# Patient Record
Sex: Female | Born: 1980 | Race: Black or African American | Hispanic: No | Marital: Married | Smoking: Former smoker
Health system: Southern US, Community
[De-identification: ages and names within clinical notes are randomized; demographics above are authoritative.]

## PROBLEM LIST (undated history)

## (undated) DIAGNOSIS — M5430 Sciatica, unspecified side: Secondary | ICD-10-CM

## (undated) DIAGNOSIS — H5789 Other specified disorders of eye and adnexa: Secondary | ICD-10-CM

## (undated) DIAGNOSIS — O039 Complete or unspecified spontaneous abortion without complication: Secondary | ICD-10-CM

## (undated) DIAGNOSIS — M549 Dorsalgia, unspecified: Secondary | ICD-10-CM

## (undated) HISTORY — DX: Other specified disorders of eye and adnexa: H57.89

## (undated) HISTORY — DX: Sciatica, unspecified side: M54.30

## (undated) HISTORY — DX: Dorsalgia, unspecified: M54.9

## (undated) HISTORY — DX: Complete or unspecified spontaneous abortion without complication: O03.9

## (undated) HISTORY — PX: HEMORRHOID SURGERY: SHX153

---

## 2013-05-05 ENCOUNTER — Emergency Department: Payer: Self-pay | Admitting: Emergency Medicine

## 2013-06-26 ENCOUNTER — Emergency Department: Payer: Self-pay | Admitting: Internal Medicine

## 2013-06-26 LAB — CBC
HCT: 32.2 % — ABNORMAL LOW (ref 35.0–47.0)
HGB: 10.7 g/dL — ABNORMAL LOW (ref 12.0–16.0)
MCH: 27.4 pg (ref 26.0–34.0)
MCHC: 33.3 g/dL (ref 32.0–36.0)
Platelet: 334 10*3/uL (ref 150–440)
RBC: 3.91 10*6/uL (ref 3.80–5.20)
RDW: 16.4 % — ABNORMAL HIGH (ref 11.5–14.5)

## 2013-06-26 LAB — COMPREHENSIVE METABOLIC PANEL
Alkaline Phosphatase: 72 U/L (ref 50–136)
Anion Gap: 6 — ABNORMAL LOW (ref 7–16)
Calcium, Total: 8.8 mg/dL (ref 8.5–10.1)
Chloride: 105 mmol/L (ref 98–107)
Co2: 28 mmol/L (ref 21–32)
Creatinine: 0.57 mg/dL — ABNORMAL LOW (ref 0.60–1.30)
EGFR (African American): >60
EGFR (Non-African Amer.): >60
Glucose: 84 mg/dL (ref 65–99)
Potassium: 3.8 mmol/L (ref 3.5–5.1)
SGPT (ALT): 20 U/L (ref 12–78)

## 2013-06-26 LAB — RAPID INFLUENZA A&B ANTIGENS

## 2013-09-15 ENCOUNTER — Emergency Department: Payer: Self-pay | Admitting: Emergency Medicine

## 2013-11-18 ENCOUNTER — Emergency Department: Payer: Self-pay | Admitting: Emergency Medicine

## 2014-02-06 ENCOUNTER — Ambulatory Visit: Payer: Self-pay | Admitting: Family Medicine

## 2014-03-08 ENCOUNTER — Emergency Department: Payer: Self-pay | Admitting: Emergency Medicine

## 2015-03-16 ENCOUNTER — Emergency Department
Admission: EM | Admit: 2015-03-16 | Discharge: 2015-03-17 | Disposition: A | Discharge status: Home / Self Care | Payer: Self-pay | Attending: Emergency Medicine | Admitting: Emergency Medicine

## 2015-03-16 ENCOUNTER — Encounter: Payer: Self-pay | Admitting: Emergency Medicine

## 2015-03-16 DIAGNOSIS — Z720 Tobacco use: Secondary | ICD-10-CM | POA: Insufficient documentation

## 2015-03-16 DIAGNOSIS — K047 Periapical abscess without sinus: Secondary | ICD-10-CM

## 2015-03-16 DIAGNOSIS — I10 Essential (primary) hypertension: Secondary | ICD-10-CM | POA: Insufficient documentation

## 2015-03-16 DIAGNOSIS — L538 Other specified erythematous conditions: Secondary | ICD-10-CM | POA: Insufficient documentation

## 2015-03-16 DIAGNOSIS — K0381 Cracked tooth: Secondary | ICD-10-CM | POA: Insufficient documentation

## 2015-03-16 DIAGNOSIS — K0889 Other specified disorders of teeth and supporting structures: Secondary | ICD-10-CM

## 2015-03-16 MED ORDER — OXYCODONE-ACETAMINOPHEN 5-325 MG PO TABS
1.0000 | ORAL_TABLET | Freq: Once | ORAL | Status: AC
  Administered 2015-03-16: 1 via ORAL
  Filled 2015-03-16: qty 1

## 2015-03-16 MED ORDER — IBUPROFEN 800 MG PO TABS: 800.0000 mg | ORAL_TABLET | Freq: Three times a day (TID) | ORAL | Status: DC | PRN

## 2015-03-16 MED ORDER — PENICILLIN V POTASSIUM 500 MG PO TABS
500.0000 mg | ORAL_TABLET | Freq: Once | ORAL | Status: AC
  Administered 2015-03-16: 500 mg via ORAL
  Filled 2015-03-16: qty 1

## 2015-03-16 MED ORDER — LIDOCAINE VISCOUS 2 % MT SOLN
15.0000 mL | Freq: Once | OROMUCOSAL | Status: AC
  Administered 2015-03-16: 15 mL via OROMUCOSAL
  Filled 2015-03-16: qty 15

## 2015-03-16 MED ORDER — PENICILLIN V POTASSIUM 500 MG PO TABS: 500.0000 mg | ORAL_TABLET | Freq: Four times a day (QID) | ORAL | Status: DC

## 2015-03-16 MED ORDER — OXYCODONE-ACETAMINOPHEN 5-325 MG PO TABS: 1.0000 | ORAL_TABLET | Freq: Three times a day (TID) | ORAL | Status: DC | PRN

## 2015-03-16 NOTE — ED Provider Notes (Signed)
[facility] Emergency Department Provider Note  ____________________________________________  Time seen: Approximately 11:08 PM  I have reviewed the triage vital signs and the nursing notes.   HISTORY  Chief Complaint Dental Pain   HPI Ashley Herring is a 34 y.o. female presents to the ER for the complaints of right lower dental pain and swelling. Patient reports that yesterday she was eating peanuts and ate peanut came stuck and a broken tooth and she states that she had a work to get that pain at out of her tooth which causes increased pain. Patient states that she woke up this morning with increased pain as well as swelling to her right lower face. Patient states that pain intermittently shoots to her right ear. Denies fall or injury. Patient presents she has had a broken tooth in that area for years but does not typically give her problems.   Reports pain is currently 6-7 out of 10 and throbbing pain. Denies other pain or injury. Denies fever.   History reviewed. No pertinent past medical history. HTN  There are no active problems to display for this patient.   Past Surgical History  Procedure Laterality Date  . Hemorrhoid surgery      Current Outpatient Rx  Name  Route  Sig  Dispense  Refill  .           .           .             Allergies Peanuts  History reviewed. No pertinent family history.  Social History History  Substance Use Topics  . Smoking status: Current Some Day Smoker  . Smokeless tobacco: Not on file  . Alcohol Use: No    Review of Systems Constitutional: No fever/chills Eyes: No visual changes. ENT: No sore throat. Positive dental pain. Denies lip, tongue swelling. Denies difficulty swallowing. Reports continues to eat and drink well.  Cardiovascular: Denies chest pain. Respiratory: Denies shortness of breath. Gastrointestinal: No abdominal pain.  No nausea, no vomiting.  No diarrhea.  No  constipation. Genitourinary: Negative for dysuria. Musculoskeletal: Negative for back pain. Skin: Negative for rash. Neurological: Negative for headaches, focal weakness or numbness.  10-point ROS otherwise negative.  ____________________________________________   PHYSICAL EXAM:  VITAL SIGNS: ED Triage Vitals  Enc Vitals Group     BP 03/16/15 2145 164/97 mmHg     Pulse Rate 03/16/15 2145 89     Resp 03/16/15 2145 20     Temp 03/16/15 2145 98.9 F (37.2 C)     Temp Source 03/16/15 2145 Oral     SpO2 03/16/15 2145 98 %     Weight 03/16/15 2145 315 lb (142.883 kg)     Height 03/16/15 2145 5\' 6"  (1.676 m)     Head Cir --      Peak Flow --      Pain Score 03/16/15 2145 10     Pain Loc --      Pain Edu? --      Excl. in GC? --     Constitutional: Alert and oriented. Well appearing and in no acute distress. Eyes: Conjunctivae are normal. PERRL. EOMI. Head: [email or web address].mild right lower face swelling.  Nose: No congestion/rhinnorhea. Ears: no erythema, normal TMs.  Mouth/Throat: Mucous membranes are moist.  Oropharynx non-erythematous. Right lower #28 fractured tooth, with mild erythema, and gum swelling. Small mild palpable fluctuance with appearance of dental abscess.  Neck: No stridor.  No cervical spine tenderness to  palpation. Hematological/Lymphatic/Immunilogical: No cervical lymphadenopathy. Cardiovascular: Normal rate, regular rhythm. Grossly normal heart sounds.  Good peripheral circulation. Respiratory: Normal respiratory effort.  No retractions. Lungs CTAB. Gastrointestinal: Soft and nontender.obese abdomen.  Musculoskeletal: No lower extremity tenderness nor edema.  No joint effusions. Neurologic:  Normal speech and language. No gross focal neurologic deficits are appreciated. Speech is normal. No gait instability. Skin:  Skin is warm, dry and intact. No rash noted. Psychiatric: Mood and affect are normal. Speech and behavior are  normal.   PROCEDURES  Procedure(s) performed: INCISION AND DRAINAGE Performed by: Renford Dills Consent: Verbal consent obtained. Risks and benefits: risks, benefits and alternatives were discussed Type: abscess  Body area: right lower   Anesthesia: local infiltration  Incision was made with a #11scalpel.  Anesthetic: topical lidocaine viscous  Drainage: purulent  Drainage amount:mild to mod  Patient tolerance: Patient tolerated the procedure well with no immediate complications.  Pt reports improved pain post incision.  ____________________________________________   INITIAL IMPRESSION / ASSESSMENT AND PLAN / ED COURSE  Pertinent labs & imaging results that were available during my care of the patient were reviewed by me and considered in my medical decision making (see chart for details).  Very well-appearing patient. No acute distress. Presents the ER for complaints of right lower dental pain as well as right facial mild swelling. Patient reports that she has a broken tooth on that side for years and reports that yesterday she had peanut stuck in that area and she had to work to get it out which caused pain. Denies other complaints.   Will treat patient with oral penicillin VK, ibuprofen as well as Percocet as needed for breakthrough pain. Dental abscess drained.  Discussed the patient and spouse to follow-up with a dentist as soon as possible, dental information given. Discussed return parameters. Patient verbalized understanding and agreed to plan.  ____________________________________________   FINAL CLINICAL IMPRESSION(S) / ED DIAGNOSES  Final diagnoses:  Pain, dental  Dental abscess      Renford Dills, NP 03/17/15 0018  Minna Antis, MD 03/17/15 2030

## 2015-03-16 NOTE — ED Notes (Addendum)
Patient presents to ED with right sided tooth pain since 2:00 am this morning, states "I'm not suppose to eat peanuts but I ate cake last night that had peanuts and one got stuck in my bad tooth...woke up this morning with it hurting and swollen." Patient reports pain radiating to right ear and down right side of neck. Severe swelling noted to right side of mouth. Broken tooth noted to the back right lower jaw. Denies chest pain, shortness of breath, difficulty swallowing. Patient alert and oriented x 4. Call bell within reach, family at bedside.

## 2015-03-16 NOTE — Discharge Instructions (Signed)
Take medication as prescribed. Rinse mouth with warm salt water multiple times per day. Follow up with dentist as soon as possible.  Return to ER for new or worsening concerns.   Dental Abscess A dental abscess is a collection of infected fluid (pus) from a bacterial infection in the inner part of the tooth (pulp). It usually occurs at the end of the tooth's root.  CAUSES   Severe tooth decay.  Trauma to the tooth that allows bacteria to enter into the pulp, such as a broken or chipped tooth. SYMPTOMS   Severe pain in and around the infected tooth.  Swelling and redness around the abscessed tooth or in the mouth or face.  Tenderness.  Pus drainage.  Bad breath.  Bitter taste in the mouth.  Difficulty swallowing.  Difficulty opening the mouth.  Nausea.  Vomiting.  Chills.  Swollen neck glands. DIAGNOSIS   A medical and dental history will be taken.  An examination will be performed by tapping on the abscessed tooth.  X-rays may be taken of the tooth to identify the abscess. TREATMENT The goal of treatment is to eliminate the infection. You may be prescribed antibiotic medicine to stop the infection from spreading. A root canal may be performed to save the tooth. If the tooth cannot be saved, it may be pulled (extracted) and the abscess may be drained.  HOME CARE INSTRUCTIONS  Only take over-the-counter or prescription medicines for pain, fever, or discomfort as directed by your caregiver.  Rinse your mouth (gargle) often with salt water ( tsp salt in 8 oz [250 ml] of warm water) to relieve pain or swelling.  Do not drive after taking pain medicine (narcotics).  Do not apply heat to the outside of your face.  Return to your dentist for further treatment as directed. SEEK MEDICAL CARE IF:  Your pain is not helped by medicine.  Your pain is getting worse instead of better. SEEK IMMEDIATE MEDICAL CARE IF:  You have a fever or persistent symptoms for more than  2-3 days.  You have a fever and your symptoms suddenly get worse.  You have chills or a very bad headache.  You have problems breathing or swallowing.  You have trouble opening your mouth.  You have swelling in the neck or around the eye. Document Released: 08/21/2005 Document Revised: 05/15/2012 Document Reviewed: 11/29/2010 [facility] Patient Information 2015 [place], [place]. This information is not intended to replace advice given to you by your health care provider. Make sure you discuss any questions you have with your health care provider. OPTIONS FOR DENTAL FOLLOW UP CARE  [place] Department of Health and Human Services - Local Safety Net Dental Clinics TripDoors.comhttp://www.ncdhhs.gov/dph/oralhealth/services/safetynetclinics.htm   [facility] [phone])  [facility] [phone])  [facility] [phone]  [facility] [phone])  [facility] [phone]) This clinic caters to the indigent population and is on a lottery system. Location: [employer] [address], [address], [place] Clinic Hours: Wednesdays from 6pm - 9pm, patients seen by a lottery system. For dates, call or go to [email or web address] Services: Cleanings, fillings and simple extractions. Payment Options: DENTAL WORK IS FREE OF CHARGE. Bring proof of income or support. Best way to get seen: Arrive at 5:15 pm - this is a lottery, NOT first come/first serve, so arriving earlier will not increase your chances of being seen.     [facility] [phone] Select option 1 for emergencies  Location: [employer] [facility], [employer], [address], [place] Clinic Hours: No walk-ins accepted - call the day before to schedule an appointment. Check in times are 9:30 am and 1:30 pm. Services: Simple extractions, temporary fillings, pulpectomy/pulp debridement,  uncomplicated abscess drainage. Payment Options: PAYMENT IS DUE AT THE TIME OF SERVICE.  Fee is usually $100-200, additional surgical procedures (e.g. abscess drainage) may be extra. Cash, checks, Visa/MasterCard accepted.  Can file Medicaid if patient is covered for dental - patient should call case worker to check. No discount for [facility] patients. Best way to get seen: MUST call the day before and get onto the schedule. Can usually be seen the next 1-2 days. No walk-ins accepted.     [facility] [phone]   Location: [facility], [address] [place] Clinic Hours: M, W, Th, F 8am or 1:30pm, Tues 9a or 1:30 - first come/first served. Services: Simple extractions, temporary fillings, uncomplicated abscess drainage.  You do not need to be an [facility] resident. Payment Options: PAYMENT IS DUE AT THE TIME OF SERVICE. Dental insurance, otherwise sliding scale - bring proof of income or support. Depending on income and treatment needed, cost is usually $50-200. Best way to get seen: Arrive early as it is first come/first served.     [facility] [phone]   Location: [address] Clinic Hours: Mon-Thu 8a-5p Services: Most basic dental services including extractions and fillings. Payment Options: PAYMENT IS DUE AT THE TIME OF SERVICE. Sliding scale, up to 50% off - bring proof if income or support. Medicaid with dental option accepted. Best way to get seen: Call to schedule an appointment, can usually be seen within 2 weeks OR they will try to see walk-ins - show up at 8a or 2p (you may have to wait).     [facility] [phone] [place] RESIDENTS ONLY   Location: [facility], [address], [place], [place] [address] Clinic Hours: By appointment only. Monday - Thursday 8am-5pm, Friday 8am-12pm Services: Cleanings, fillings,  extractions. Payment Options: PAYMENT IS DUE AT THE TIME OF SERVICE. Cash, Visa or MasterCard. Sliding scale - $30 minimum per service. Best way to get seen: Come in to office, complete packet and make an appointment - need proof of income or support monies for each household member and proof of [facility] residence. Usually takes about a month to get in.     [facility] [phone]   Location: [address] [facility] Clinic Hours: Walk-in Urgent Care Dental Services are offered Monday-Friday mornings only. The numbers of emergencies accepted daily is limited to the number of providers available. Maximum 15 - Mondays, Wednesdays & Thursdays Maximum 10 - Tuesdays & Fridays Services: You do not need to be a [facility] resident to be seen for a dental emergency. Emergencies are defined as pain, swelling, abnormal bleeding, or dental trauma. Walkins will receive x-rays if needed. NOTE: Dental cleaning is not an emergency. Payment Options: PAYMENT IS DUE AT THE TIME OF SERVICE. Minimum co-pay is $40.00 for uninsured patients. Minimum co-pay is $3.00 for Medicaid with dental coverage. Dental Insurance is accepted and must be presented at time of visit. Medicare does not cover dental. Forms of payment: Cash, credit card, checks. Best way to get seen: If not previously registered with the clinic, walk-in dental registration begins at 7:15 am and is on a first come/first serve basis. If previously  registered with the clinic, call to make an appointment.     [facility] [phone] [place] RESIDENTS ONLY   Location: [address], [place], [place] Clinic Hours: Mon-Thu 10a-2p Services: Extractions only! Payment Options: FREE (donations accepted) - bring proof of income or support Best way to get seen: Call and schedule an appointment OR come at 8am on the 1st Monday of every month (except for holidays) when it is first  come/first served.     [facility] [phone]   Location: [address] [place], [place] Clinic Hours: Friday mornings Services, Payment Options, Best way to get seen: Call for info

## 2015-03-16 NOTE — ED Notes (Signed)
Pt presents to ER alert and in NAD. Pt has swelling to right side of mouth. Reports she has a bad tooth on that side.

## 2015-03-17 NOTE — ED Notes (Signed)

## 2017-03-15 ENCOUNTER — Encounter: Payer: Self-pay | Admitting: Emergency Medicine

## 2017-03-15 ENCOUNTER — Emergency Department
Admission: EM | Admit: 2017-03-15 | Discharge: 2017-03-15 | Disposition: A | Discharge status: Home / Self Care | Payer: Self-pay | Attending: Student in an Organized Health Care Education/Training Program | Admitting: Student in an Organized Health Care Education/Training Program

## 2017-03-15 ENCOUNTER — Emergency Department: Payer: Self-pay

## 2017-03-15 DIAGNOSIS — B9689 Other specified bacterial agents as the cause of diseases classified elsewhere: Secondary | ICD-10-CM

## 2017-03-15 DIAGNOSIS — Z91010 Allergy to peanuts: Secondary | ICD-10-CM | POA: Insufficient documentation

## 2017-03-15 DIAGNOSIS — R1031 Right lower quadrant pain: Secondary | ICD-10-CM | POA: Insufficient documentation

## 2017-03-15 DIAGNOSIS — R300 Dysuria: Secondary | ICD-10-CM | POA: Insufficient documentation

## 2017-03-15 DIAGNOSIS — Z79899 Other long term (current) drug therapy: Secondary | ICD-10-CM | POA: Insufficient documentation

## 2017-03-15 DIAGNOSIS — Z79891 Long term (current) use of opiate analgesic: Secondary | ICD-10-CM | POA: Insufficient documentation

## 2017-03-15 DIAGNOSIS — N760 Acute vaginitis: Secondary | ICD-10-CM

## 2017-03-15 DIAGNOSIS — R102 Pelvic and perineal pain: Secondary | ICD-10-CM | POA: Insufficient documentation

## 2017-03-15 DIAGNOSIS — F17210 Nicotine dependence, cigarettes, uncomplicated: Secondary | ICD-10-CM | POA: Insufficient documentation

## 2017-03-15 LAB — CHLAMYDIA/NGC RT PCR (ARMC ONLY)
CHLAMYDIA TR: NOT DETECTED
N gonorrhoeae: NOT DETECTED

## 2017-03-15 LAB — CBC WITH DIFFERENTIAL/PLATELET
BASOS PCT: 1 %
Basophils Absolute: 0.1 10*3/uL (ref 0–0.1)
EOS ABS: 0.2 10*3/uL (ref 0–0.7)
EOS PCT: 2 %
HCT: 35.3 % (ref 35.0–47.0)
HEMOGLOBIN: 12 g/dL (ref 12.0–16.0)
Lymphocytes Relative: 24 %
Lymphs Abs: 3.3 10*3/uL (ref 1.0–3.6)
MCH: 28.3 pg (ref 26.0–34.0)
MCHC: 34.1 g/dL (ref 32.0–36.0)
MCV: 83 fL (ref 80.0–100.0)
Monocytes Absolute: 0.8 10*3/uL (ref 0.2–0.9)
Monocytes Relative: 6 %
Neutro Abs: 9.4 10*3/uL — ABNORMAL HIGH (ref 1.4–6.5)
Neutrophils Relative %: 67 %
PLATELETS: 458 10*3/uL — AB (ref 150–440)
RBC: 4.25 MIL/uL (ref 3.80–5.20)
RDW: 14.3 % (ref 11.5–14.5)
WBC: 13.8 10*3/uL — AB (ref 3.6–11.0)

## 2017-03-15 LAB — URINALYSIS, COMPLETE (UACMP) WITH MICROSCOPIC
Bilirubin Urine: NEGATIVE
GLUCOSE, UA: NEGATIVE mg/dL
Ketones, ur: NEGATIVE mg/dL
Leukocytes, UA: NEGATIVE
Nitrite: NEGATIVE
PH: 7 (ref 5.0–8.0)
Protein, ur: NEGATIVE mg/dL
Specific Gravity, Urine: 1.003 — ABNORMAL LOW (ref 1.005–1.030)
WBC, UA: NONE SEEN WBC/hpf (ref 0–5)

## 2017-03-15 LAB — BASIC METABOLIC PANEL
Anion gap: 10 (ref 5–15)
BUN: 11 mg/dL (ref 6–20)
CHLORIDE: 103 mmol/L (ref 101–111)
CO2: 23 mmol/L (ref 22–32)
CREATININE: 0.51 mg/dL (ref 0.44–1.00)
Calcium: 9.1 mg/dL (ref 8.9–10.3)
Glucose, Bld: 99 mg/dL (ref 65–99)
Potassium: 3.4 mmol/L — ABNORMAL LOW (ref 3.5–5.1)
SODIUM: 136 mmol/L (ref 135–145)

## 2017-03-15 LAB — POCT PREGNANCY, URINE: Preg Test, Ur: NEGATIVE

## 2017-03-15 LAB — WET PREP, GENITAL
Sperm: NONE SEEN
TRICH WET PREP: NONE SEEN
Yeast Wet Prep HPF POC: NONE SEEN

## 2017-03-15 MED ORDER — KETOROLAC TROMETHAMINE 30 MG/ML IJ SOLN
15.0000 mg | Freq: Once | INTRAMUSCULAR | Status: AC
  Administered 2017-03-15: 15 mg via INTRAVENOUS
  Filled 2017-03-15: qty 1

## 2017-03-15 MED ORDER — HYDROCODONE-ACETAMINOPHEN 5-325 MG PO TABS: 1.0000 | ORAL_TABLET | ORAL | 0 refills | Status: DC | PRN

## 2017-03-15 MED ORDER — IOPAMIDOL (ISOVUE-300) INJECTION 61%
125.0000 mL | Freq: Once | INTRAVENOUS | Status: AC | PRN
  Administered 2017-03-15: 125 mL via INTRAVENOUS
  Filled 2017-03-15: qty 150

## 2017-03-15 MED ORDER — CEPHALEXIN 500 MG PO CAPS: 500.0000 mg | ORAL_CAPSULE | Freq: Three times a day (TID) | ORAL | 0 refills | Status: AC

## 2017-03-15 MED ORDER — PROMETHAZINE HCL 25 MG/ML IJ SOLN
INTRAMUSCULAR | Status: AC
  Administered 2017-03-15: 12.5 mg via INTRAVENOUS
  Filled 2017-03-15: qty 1

## 2017-03-15 MED ORDER — PROMETHAZINE HCL 25 MG/ML IJ SOLN
12.5000 mg | Freq: Four times a day (QID) | INTRAMUSCULAR | Status: DC | PRN
  Administered 2017-03-15: 12.5 mg via INTRAVENOUS

## 2017-03-15 MED ORDER — METRONIDAZOLE 500 MG PO TABS
500.0000 mg | ORAL_TABLET | Freq: Once | ORAL | Status: AC
  Administered 2017-03-15: 500 mg via ORAL

## 2017-03-15 MED ORDER — HYDROCODONE-ACETAMINOPHEN 5-325 MG PO TABS
ORAL_TABLET | ORAL | Status: AC
  Administered 2017-03-15: 2 via ORAL
  Filled 2017-03-15: qty 2

## 2017-03-15 MED ORDER — PROMETHAZINE HCL 12.5 MG PO TABS: 12.5000 mg | ORAL_TABLET | Freq: Four times a day (QID) | ORAL | 0 refills | Status: DC | PRN

## 2017-03-15 MED ORDER — HYDROCODONE-ACETAMINOPHEN 5-325 MG PO TABS
2.0000 | ORAL_TABLET | Freq: Once | ORAL | Status: AC
  Administered 2017-03-15: 2 via ORAL

## 2017-03-15 MED ORDER — MORPHINE SULFATE (PF) 4 MG/ML IV SOLN
4.0000 mg | INTRAVENOUS | Status: DC | PRN
  Administered 2017-03-15: 4 mg via INTRAVENOUS

## 2017-03-15 MED ORDER — SODIUM CHLORIDE 0.9 % IV BOLUS (SEPSIS)
1000.0000 mL | Freq: Once | INTRAVENOUS | Status: AC
  Administered 2017-03-15: 1000 mL via INTRAVENOUS

## 2017-03-15 MED ORDER — MORPHINE SULFATE (PF) 4 MG/ML IV SOLN
INTRAVENOUS | Status: AC
  Administered 2017-03-15: 4 mg via INTRAVENOUS
  Filled 2017-03-15: qty 1

## 2017-03-15 MED ORDER — MORPHINE SULFATE (PF) 4 MG/ML IV SOLN
4.0000 mg | INTRAVENOUS | Status: AC | PRN
  Administered 2017-03-15: 4 mg via INTRAVENOUS
  Filled 2017-03-15: qty 1

## 2017-03-15 MED ORDER — METRONIDAZOLE 500 MG PO TABS
ORAL_TABLET | ORAL | Status: AC
  Administered 2017-03-15: 500 mg via ORAL
  Filled 2017-03-15: qty 1

## 2017-03-15 MED ORDER — CEPHALEXIN 500 MG PO CAPS
500.0000 mg | ORAL_CAPSULE | Freq: Once | ORAL | Status: AC
  Administered 2017-03-15: 500 mg via ORAL
  Filled 2017-03-15: qty 1

## 2017-03-15 MED ORDER — METRONIDAZOLE 500 MG PO TABS: 500.0000 mg | ORAL_TABLET | Freq: Two times a day (BID) | ORAL | 0 refills | Status: AC

## 2017-03-15 NOTE — ED Notes (Signed)
Patient transported to Ultrasound 

## 2017-03-15 NOTE — ED Notes (Signed)

## 2017-03-15 NOTE — ED Notes (Signed)
Rectal exam completed by Dr. Roxan Hockeyobinson. This RN at bedside to assist.

## 2017-03-15 NOTE — ED Provider Notes (Signed)
[facility] Emergency Department Provider Note    First MD Initiated Contact with Patient 03/15/17 1637     (approximate)  I have reviewed the triage vital signs and the nursing notes.   HISTORY  Chief Complaint Vaginal Pain and Urinary Frequency    HPI Ashley Herring is a 36 y.o. female presents with acute onset right lower quadrant pain radiating down her groin and vagina as well as pain radiating to recommend is worse with coughing and movement. States that she is also having increased urinary pressure dysuria. Has never had pain like this before. Denies any vaginal bleeding. No vaginal discharge. Does not think that she is pregnant. No nausea or vomiting. No fevers.He is chest pain. History of hemorrhoids but states this feels different.   History reviewed. No pertinent past medical history. No family history on file. Past Surgical History:  Procedure Laterality Date  . HEMORRHOID SURGERY     There are no active problems to display for this patient.     Prior to Admission medications   Medication Sig Start Date End Date Taking? Authorizing Provider  cephALEXin (KEFLEX) 500 MG capsule Take 1 capsule (500 mg total) by mouth 3 (three) times daily. 03/15/17 03/22/17  Willy Eddy, MD  HYDROcodone-acetaminophen (NORCO) 5-325 MG tablet Take 1 tablet by mouth every 4 (four) hours as needed for moderate pain. 03/15/17   Willy Eddy, MD  ibuprofen (ADVIL,MOTRIN) 800 MG tablet Take 1 tablet (800 mg total) by mouth every 8 (eight) hours as needed for mild pain or moderate pain. Patient not taking: Reported on 03/15/2017 03/16/15   Renford Dills, NP  metroNIDAZOLE (FLAGYL) 500 MG tablet Take 1 tablet (500 mg total) by mouth 2 (two) times daily. 03/15/17 03/22/17  Willy Eddy, MD  oxyCODONE-acetaminophen (ROXICET) 5-325 MG per tablet Take 1 tablet by mouth every 8 (eight) hours as needed for moderate pain or severe pain (Do not drive or operate  heavy machinery while taking as can cause drowsiness.). Patient not taking: Reported on 03/15/2017 03/16/15   Renford Dills, NP  penicillin v potassium (VEETID) 500 MG tablet Take 1 tablet (500 mg total) by mouth 4 (four) times daily. Patient not taking: Reported on 03/15/2017 03/16/15   Renford Dills, NP  promethazine (PHENERGAN) 12.5 MG tablet Take 1 tablet (12.5 mg total) by mouth every 6 (six) hours as needed for nausea or vomiting. 03/15/17   Willy Eddy, MD    Allergies Peanuts [peanut oil]    Social History Social History  Substance Use Topics  . Smoking status: Current Some Day Smoker  . Smokeless tobacco: Not on file  . Alcohol use No    Review of Systems Patient denies headaches, rhinorrhea, blurry vision, numbness, shortness of breath, chest pain, edema, cough, abdominal pain, nausea, vomiting, diarrhea, dysuria, fevers, rashes or hallucinations unless otherwise stated above in HPI. ____________________________________________   PHYSICAL EXAM:  VITAL SIGNS: Vitals:   03/15/17 2028 03/15/17 2241  BP:  (!) 141/74  Pulse:  75  Resp:  20  Temp: 99 F (37.2 C)     Constitutional: Alert and oriented. Well appearing and in no acute distress. Eyes: Conjunctivae are normal.  Head: Atraumatic. Nose: No congestion/rhinnorhea. Mouth/Throat: Mucous membranes are moist.   Neck: No stridor. Painless ROM.  Cardiovascular: Normal rate, regular rhythm. Grossly normal heart sounds.  Good peripheral circulation. Respiratory: Normal respiratory effort.  No retractions. Lungs CTAB. Gastrointestinal: Soft and nontender. No distention. No abdominal bruits. No CVA tenderness. Genitourinary:  Musculoskeletal: No  lower extremity tenderness nor edema.  No joint effusions. Neurologic:  Normal speech and language. No gross focal neurologic deficits are appreciated. No facial droop Skin:  Skin is warm, dry and intact. No rash noted. Psychiatric: Mood and affect are normal. Speech  and behavior are normal.  ____________________________________________   LABS (all labs ordered are listed, but only abnormal results are displayed)  Results for orders placed or performed during the hospital encounter of 03/15/17 (from the past 24 hour(s))  Urinalysis, Complete w Microscopic     Status: Abnormal   Collection Time: 03/15/17  4:26 PM  Result Value Ref Range   Color, Urine STRAW (A) YELLOW   APPearance CLEAR (A) CLEAR   Specific Gravity, Urine 1.003 (L) 1.005 - 1.030   pH 7.0 5.0 - 8.0   Glucose, UA NEGATIVE NEGATIVE mg/dL   Hgb urine dipstick SMALL (A) NEGATIVE   Bilirubin Urine NEGATIVE NEGATIVE   Ketones, ur NEGATIVE NEGATIVE mg/dL   Protein, ur NEGATIVE NEGATIVE mg/dL   Nitrite NEGATIVE NEGATIVE   Leukocytes, UA NEGATIVE NEGATIVE   RBC / HPF 0-5 0 - 5 RBC/hpf   WBC, UA NONE SEEN 0 - 5 WBC/hpf   Bacteria, UA RARE (A) NONE SEEN   Squamous Epithelial / LPF 0-5 (A) NONE SEEN   Mucous PRESENT   Pregnancy, urine POC     Status: None   Collection Time: 03/15/17  4:37 PM  Result Value Ref Range   Preg Test, Ur NEGATIVE NEGATIVE  CBC with Differential/Platelet     Status: Abnormal   Collection Time: 03/15/17  4:58 PM  Result Value Ref Range   WBC 13.8 (H) 3.6 - 11.0 K/uL   RBC 4.25 3.80 - 5.20 MIL/uL   Hemoglobin 12.0 12.0 - 16.0 g/dL   HCT 16.1 09.6 - 04.5 %   MCV 83.0 80.0 - 100.0 fL   MCH 28.3 26.0 - 34.0 pg   MCHC 34.1 32.0 - 36.0 g/dL   RDW 40.9 81.1 - 91.4 %   Platelets 458 (H) 150 - 440 K/uL   Neutrophils Relative % 67 %   Neutro Abs 9.4 (H) 1.4 - 6.5 K/uL   Lymphocytes Relative 24 %   Lymphs Abs 3.3 1.0 - 3.6 K/uL   Monocytes Relative 6 %   Monocytes Absolute 0.8 0.2 - 0.9 K/uL   Eosinophils Relative 2 %   Eosinophils Absolute 0.2 0 - 0.7 K/uL   Basophils Relative 1 %   Basophils Absolute 0.1 0 - 0.1 K/uL  Basic metabolic panel     Status: Abnormal   Collection Time: 03/15/17  4:58 PM  Result Value Ref Range   Sodium 136 135 - 145 mmol/L    Potassium 3.4 (L) 3.5 - 5.1 mmol/L   Chloride 103 101 - 111 mmol/L   CO2 23 22 - 32 mmol/L   Glucose, Bld 99 65 - 99 mg/dL   BUN 11 6 - 20 mg/dL   Creatinine, Ser 7.82 0.44 - 1.00 mg/dL   Calcium 9.1 8.9 - 95.6 mg/dL   GFR calc non Af Amer >60 >60 mL/min   GFR calc Af Amer >60 >60 mL/min   Anion gap 10 5 - 15  Chlamydia/NGC rt PCR ([facility] only)     Status: None   Collection Time: 03/15/17  8:25 PM  Result Value Ref Range   Specimen source GC/Chlam ENDOCERVICAL    Chlamydia Tr NOT DETECTED NOT DETECTED   N gonorrhoeae NOT DETECTED NOT DETECTED  Wet prep, genital  Status: Abnormal   Collection Time: 03/15/17  8:25 PM  Result Value Ref Range   Yeast Wet Prep HPF POC NONE SEEN NONE SEEN   Trich, Wet Prep NONE SEEN NONE SEEN   Clue Cells Wet Prep HPF POC PRESENT (A) NONE SEEN   WBC, Wet Prep HPF POC MODERATE (A) NONE SEEN   Sperm NONE SEEN    ____________________________________________ ____________________________________________  RADIOLOGY  I personally reviewed all radiographic images ordered to evaluate for the above acute complaints and reviewed radiology reports and findings.  These findings were personally discussed with the patient.  Please see medical record for radiology report.  ____________________________________________   PROCEDURES  Procedure(s) performed:  Procedures    Critical Care performed: no ____________________________________________   INITIAL IMPRESSION / ASSESSMENT AND PLAN / ED COURSE  Pertinent labs & imaging results that were available during my care of the patient were reviewed by me and considered in my medical decision making (see chart for details).  DDX: stone, appy, toa, torsion, hemorrhoid, colitis,. msk strain  Ashley Herring is a 36 y.o. who presents to the ED with Right flank and right lower quadrant pain as described above. Patient does appear very uncomfortable but is nontoxic appearing. Based on her obesity and limited  ability to evaluate for acute intra-abdominal process CT imaging will be ordered to evaluate for any evidence of acute appendicitis. Rectal exam is normal. Will plan pelvic exam.  The patient will be placed on continuous pulse oximetry and telemetry for monitoring.  Laboratory evaluation will be sent to evaluate for the above complaints.     Clinical Course as of Mar 15 2302  Thu Mar 15, 2017  1948 CT report shows no evidence of acute appendicitis. There is no evidence of neck and adnexal mass or TOA. Patient does feel symptoms are improving. There is some mild sludge in the gallbladder but patient without any right upper quadrant pain.  [PR]  2028 HR and symptoms improved.   [PR]  2047 Pelvic exam performed and does show some white vaginal discharge but no cervical motion tenderness or adnexal pain. No findings to suggest to ovarian abscess or torsion. Based on her symptoms at this point I do suspect that she has some component of cystitis and we'll start patient on antibiotics and send urine for culture. She remains afebrile and heart rate is improving. Patient symptoms have improved since being in the ER. Reiterates that she still feels this pressure that is not relieved until she urinates but after that she feels much better. Patient tolerating oral hydration and repeat abdominal exam is soft and benign.  [PR]  2251 [facility] was read as no acute abnormality. No evidence of torsion or abscess.  Have discussed with the patient and available family all diagnostics and treatments performed thus far and all questions were answered to the best of my ability. The patient demonstrates understanding and agreement with plan.   [PR]    Clinical Course User Index [PR] Willy Eddyobinson, Takao Lizer, MD     ____________________________________________   FINAL CLINICAL IMPRESSION(S) / ED DIAGNOSES  Final diagnoses:  RLQ abdominal pain  Bacterial  vaginosis  Dysuria      NEW MEDICATIONS STARTED DURING THIS VISIT:  New Prescriptions   CEPHALEXIN (KEFLEX) 500 MG CAPSULE    Take 1 capsule (500 mg total) by mouth 3 (three) times daily.   HYDROCODONE-ACETAMINOPHEN (NORCO) 5-325 MG TABLET    Take 1 tablet by mouth  every 4 (four) hours as needed for moderate pain.   METRONIDAZOLE (FLAGYL) 500 MG TABLET    Take 1 tablet (500 mg total) by mouth 2 (two) times daily.   PROMETHAZINE (PHENERGAN) 12.5 MG TABLET    Take 1 tablet (12.5 mg total) by mouth every 6 (six) hours as needed for nausea or vomiting.     Note:  This document was prepared using Dragon voice recognition software and may include unintentional dictation errors.    Willy Eddy, MD 03/15/17 2303

## 2017-03-15 NOTE — ED Triage Notes (Signed)
Pt reports vaginal pain and urinary frequency that began today. Pt states the pain comes and goes but each time it comes it gets sharper. Denies NVD.

## 2017-03-15 NOTE — Discharge Instructions (Signed)

## 2018-01-06 ENCOUNTER — Emergency Department
Admission: EM | Admit: 2018-01-06 | Discharge: 2018-01-07 | Disposition: A | Discharge status: Home / Self Care | Payer: Self-pay | Attending: Emergency Medicine | Admitting: Emergency Medicine

## 2018-01-06 ENCOUNTER — Emergency Department: Payer: Self-pay

## 2018-01-06 ENCOUNTER — Other Ambulatory Visit: Payer: Self-pay

## 2018-01-06 DIAGNOSIS — Z79899 Other long term (current) drug therapy: Secondary | ICD-10-CM | POA: Insufficient documentation

## 2018-01-06 DIAGNOSIS — M5136 Other intervertebral disc degeneration, lumbar region: Secondary | ICD-10-CM

## 2018-01-06 DIAGNOSIS — M5126 Other intervertebral disc displacement, lumbar region: Secondary | ICD-10-CM | POA: Insufficient documentation

## 2018-01-06 DIAGNOSIS — F17210 Nicotine dependence, cigarettes, uncomplicated: Secondary | ICD-10-CM | POA: Insufficient documentation

## 2018-01-06 DIAGNOSIS — M5432 Sciatica, left side: Secondary | ICD-10-CM

## 2018-01-06 DIAGNOSIS — Z91010 Allergy to peanuts: Secondary | ICD-10-CM | POA: Insufficient documentation

## 2018-01-06 DIAGNOSIS — M5442 Lumbago with sciatica, left side: Secondary | ICD-10-CM | POA: Insufficient documentation

## 2018-01-06 LAB — CBC WITH DIFFERENTIAL/PLATELET
Basophils Absolute: 0 10*3/uL (ref 0–0.1)
Basophils Relative: 1 %
EOS ABS: 0.1 10*3/uL (ref 0–0.7)
EOS PCT: 1 %
HCT: 33.8 % — ABNORMAL LOW (ref 35.0–47.0)
HEMOGLOBIN: 11.7 g/dL — AB (ref 12.0–16.0)
LYMPHS ABS: 2.9 10*3/uL (ref 1.0–3.6)
Lymphocytes Relative: 32 %
MCH: 29.4 pg (ref 26.0–34.0)
MCHC: 34.6 g/dL (ref 32.0–36.0)
MCV: 85 fL (ref 80.0–100.0)
MONO ABS: 0.6 10*3/uL (ref 0.2–0.9)
MONOS PCT: 7 %
NEUTROS PCT: 59 %
Neutro Abs: 5.6 10*3/uL (ref 1.4–6.5)
Platelets: 394 10*3/uL (ref 150–440)
RBC: 3.98 MIL/uL (ref 3.80–5.20)
RDW: 14.7 % — AB (ref 11.5–14.5)
WBC: 9.3 10*3/uL (ref 3.6–11.0)

## 2018-01-06 LAB — URINALYSIS, COMPLETE (UACMP) WITH MICROSCOPIC
Bacteria, UA: NONE SEEN
Bilirubin Urine: NEGATIVE
GLUCOSE, UA: NEGATIVE mg/dL
Hgb urine dipstick: NEGATIVE
Ketones, ur: NEGATIVE mg/dL
Leukocytes, UA: NEGATIVE
Nitrite: NEGATIVE
PROTEIN: 30 mg/dL — AB
Specific Gravity, Urine: 1.026 (ref 1.005–1.030)
pH: 6 (ref 5.0–8.0)

## 2018-01-06 LAB — BASIC METABOLIC PANEL
ANION GAP: 7 (ref 5–15)
BUN: 12 mg/dL (ref 6–20)
CO2: 25 mmol/L (ref 22–32)
Calcium: 8.5 mg/dL — ABNORMAL LOW (ref 8.9–10.3)
Chloride: 103 mmol/L (ref 101–111)
Creatinine, Ser: 0.64 mg/dL (ref 0.44–1.00)
GFR calc Af Amer: >60 mL/min (ref >60)
Glucose, Bld: 99 mg/dL (ref 65–99)
POTASSIUM: 3.4 mmol/L — AB (ref 3.5–5.1)
SODIUM: 135 mmol/L (ref 135–145)

## 2018-01-06 LAB — POCT PREGNANCY, URINE: Preg Test, Ur: NEGATIVE

## 2018-01-06 MED ORDER — HYDROMORPHONE HCL 1 MG/ML IJ SOLN
1.0000 mg | Freq: Once | INTRAMUSCULAR | Status: AC
  Administered 2018-01-06: 1 mg via INTRAVENOUS
  Filled 2018-01-06: qty 1

## 2018-01-06 NOTE — ED Triage Notes (Signed)
Patient reports lower back pain since Tuesday, denies any type of injury.  Patient reports difficulty passing gas and having a bowel movement.  Patient reports pain in lower back and radiates down into thighs.

## 2018-01-06 NOTE — ED Notes (Signed)
Patient transported to MRI 

## 2018-01-06 NOTE — ED Notes (Signed)
Pt reports lower back pain (worse with palpation) since sleeping in the ED with her daughter last week pain radiates down left leg when walking,  Pt reports numbness and tingling to feet, pt also reports difficulty with passing gas and BM  Pt CMS intact to extremity

## 2018-01-06 NOTE — Discharge Instructions (Addendum)
Please follow up with neurosurgery for further evaluation of your bulging discs

## 2018-01-06 NOTE — ED Notes (Signed)
MRI tech talking to pt on phone

## 2018-01-06 NOTE — ED Provider Notes (Addendum)
[facility] Emergency Department Provider Note  ____________________________________________   I have reviewed the triage vital signs and the nursing notes. Where available I have reviewed prior notes and, if possible and indicated, outside hospital notes.    HISTORY  Chief Complaint Back Pain    HPI Ashley Herring is a 37 y.o. female   With no significant past medical history.  She was with her daughter last week and she laid awkwardly on the bed.  She did not initially have pain, but gradually she began to have more pain in her back which radiates down her left leg.  She denies any weakness, she has a tingling feeling sometimes in her leg.  She denies any abdominal pain dysuria urinary frequency.  No incontinence of bowel or bladder but she states it is "hard to push had a bowel movement".  She cannot tell me why.  She states that she also has difficulty generating bowel gas.  She has not had anything like this before.  There is no specific imaging of her back from prior visits however a CT scan in July of last year did noted that the patient had degenerative changes without suspicious pushes or acute bone lesions.  The pain is severe constant nothing makes it better nothing makes it worse except for moving, hard to find a comfortable position.  She has taken Tylenol Motrin without any effect.    No past medical history on file.  There are no active problems to display for this patient.   Past Surgical History:  Procedure Laterality Date  . HEMORRHOID SURGERY      Prior to Admission medications   Medication Sig Start Date End Date Taking? Authorizing Provider  HYDROcodone-acetaminophen (NORCO) 5-325 MG tablet Take 1 tablet by mouth every 4 (four) hours as needed for moderate pain. 03/15/17   Willy Eddy, MD  ibuprofen (ADVIL,MOTRIN) 800 MG tablet Take 1 tablet (800 mg total) by mouth every 8 (eight) hours as needed for mild pain or moderate  pain. Patient not taking: Reported on 03/15/2017 03/16/15   Renford Dills, NP  oxyCODONE-acetaminophen (ROXICET) 5-325 MG per tablet Take 1 tablet by mouth every 8 (eight) hours as needed for moderate pain or severe pain (Do not drive or operate heavy machinery while taking as can cause drowsiness.). Patient not taking: Reported on 03/15/2017 03/16/15   Renford Dills, NP  penicillin v potassium (VEETID) 500 MG tablet Take 1 tablet (500 mg total) by mouth 4 (four) times daily. Patient not taking: Reported on 03/15/2017 03/16/15   Renford Dills, NP  promethazine (PHENERGAN) 12.5 MG tablet Take 1 tablet (12.5 mg total) by mouth every 6 (six) hours as needed for nausea or vomiting. 03/15/17   Willy Eddy, MD    Allergies Peanuts [peanut oil]  No family history on file.  Social History Social History   Tobacco Use  . Smoking status: Current Some Day Smoker  Substance Use Topics  . Alcohol use: No  . Drug use: Not on file    Review of Systems Constitutional: No fever/chills Eyes: No visual changes. ENT: No sore throat. No stiff neck no neck pain Cardiovascular: Denies chest pain. Respiratory: Denies shortness of breath. Gastrointestinal:   no vomiting.  No diarrhea.  No constipation. Genitourinary: Negative for dysuria. Musculoskeletal: Negative lower extremity swelling Skin: Negative for rash. Neurological: Negative for severe headaches, focal weakness or numbness.   ____________________________________________   PHYSICAL EXAM:  VITAL SIGNS: ED Triage Vitals  Enc Vitals Group  BP 01/06/18 2050 (!) 168/116     Pulse Rate 01/06/18 2050 (!) 112     Resp 01/06/18 2050 (!) 22     Temp 01/06/18 2050 98.8 F (37.1 C)     Temp Source 01/06/18 2050 Oral     SpO2 01/06/18 2050 99 %     Weight 01/06/18 2048 (!) 352 lb (159.7 kg)     Height 01/06/18 2048  (1.651 m)     Head Circumference --      Peak Flow --      Pain Score --      Pain Loc --      Pain Edu? --       Excl. in GC? --     Constitutional: Alert and oriented. Well appearing and in no acute distress.  Somewhat anxious lying on her side. Eyes: Conjunctivae are normal Head: Atraumatic HEENT: No congestion/rhinnorhea. Mucous membranes are moist.  Oropharynx non-erythematous Neck:   Nontender with no meningismus, no masses, no stridor Cardiovascular: Normal rate, regular rhythm. Grossly normal heart sounds.  Good peripheral circulation. Respiratory: Normal respiratory effort.  No retractions. Lungs CTAB. Abdominal: Soft and nontender. No distention. No guarding no rebound noted Back: Some tenderness to palpation of the paraspinal muscles mostly in the left lower lumbar region around L4, does cross the midline a little bit but mostly is paraspinal.   there are no lesions noted. there is no CVA tenderness Musculoskeletal: No lower extremity tenderness, no upper extremity tenderness. No joint effusions, no DVT signs strong distal pulses no edema Neurologic:  Normal speech and language. No gross focal neurologic deficits are appreciated.  Does have bilateral reflexes in the patellar region, which appear to be symmetric, there, there is no saddle anesthesia but she states that there is some "tingling" in the left very proximal medial thigh.  Female nurse chaperone present for this exam. Skin:  Skin is warm, dry and intact. No rash noted. Psychiatric: Mood and affect are normal. Speech and behavior are normal.  ____________________________________________   LABS (all labs ordered are listed, but only abnormal results are displayed)  Labs Reviewed  CBC WITH DIFFERENTIAL/PLATELET  URINALYSIS, COMPLETE (UACMP) WITH MICROSCOPIC  BASIC METABOLIC PANEL  POC URINE PREG, ED    Pertinent labs  results that were available during my care of the patient were reviewed by me and considered in my medical decision making (see chart for details). ____________________________________________  EKG  I  personally interpreted any EKGs ordered by me or triage  ____________________________________________  RADIOLOGY  Pertinent labs & imaging results that were available during my care of the patient were reviewed by me and considered in my medical decision making (see chart for details). If possible, patient and/or family made aware of any abnormal findings.  No results found. ____________________________________________    PROCEDURES  Procedure(s) performed: None  Procedures  Critical Care performed: None  ____________________________________________   INITIAL IMPRESSION / ASSESSMENT AND PLAN / ED COURSE  Pertinent labs & imaging results that were available during my care of the patient were reviewed by me and considered in my medical decision making (see chart for details).  Patient with reproducible back pain with sciatic symptoms radiating down the left leg.  She does state that she is having some difficulty generating bowel movements, it is difficult to fully evaluate her strength of the does not appear to be any exits.  She has no abdominal pain and no flank pain.  We will obtain basic blood work as  a precaution, urinalysis urine pregnancy although she denies pregnancy and we will obtain MRI of her lower back to rule out significant lesion that could cause cauda equina syndrome.  Patient will be given pain medication.  It is somewhat difficult to do a good exam in this patient due to morbid obesity and I think imaging will be of utility if possible.  However, I am reassured by her neurologic exam her reflexes etc.  ----------------------------------------- 11:29 PM on 01/06/2018 -----------------------------------------  Pt pain is better controlled. Awaiting mri. Signed out to dr. Zenda Alpers at the end of my shift.    ____________________________________________   FINAL CLINICAL IMPRESSION(S) / ED DIAGNOSES  Final diagnoses:  None      This chart was dictated using  voice recognition software.  Despite best efforts to proofread,  errors can occur which can change meaning.      Jeanmarie Plant, MD 01/06/18 2210    Jeanmarie Plant, MD 01/06/18 [phone]

## 2018-01-07 MED ORDER — DIAZEPAM 2 MG PO TABS
2.0000 mg | ORAL_TABLET | Freq: Once | ORAL | Status: AC
  Administered 2018-01-07: 2 mg via ORAL
  Filled 2018-01-07: qty 1

## 2018-01-07 MED ORDER — DIAZEPAM 2 MG PO TABS: 2.0000 mg | ORAL_TABLET | Freq: Four times a day (QID) | ORAL | 0 refills | Status: DC | PRN

## 2018-01-07 MED ORDER — PREDNISONE 10 MG (21) PO TBPK: ORAL_TABLET | ORAL | 0 refills | Status: DC

## 2018-01-07 MED ORDER — KETOROLAC TROMETHAMINE 30 MG/ML IJ SOLN
30.0000 mg | Freq: Once | INTRAMUSCULAR | Status: AC
  Administered 2018-01-07: 30 mg via INTRAVENOUS
  Filled 2018-01-07: qty 1

## 2018-01-07 MED ORDER — LIDOCAINE 5 % EX PTCH: 1.0000 | MEDICATED_PATCH | Freq: Two times a day (BID) | CUTANEOUS | 0 refills | Status: AC

## 2018-01-07 MED ORDER — LIDOCAINE 5 % EX PTCH
1.0000 | MEDICATED_PATCH | CUTANEOUS | Status: DC
  Administered 2018-01-07: 1 via TRANSDERMAL
  Filled 2018-01-07: qty 1

## 2018-01-07 NOTE — ED Provider Notes (Signed)
-----------------------------------------   2:45 AM on 01/07/2018 -----------------------------------------   Blood pressure (!) 168/116, pulse (!) 112, temperature 98.8 F (37.1 C), temperature source Oral, resp. rate (!) 22, height  (1.651 m), weight (!) 159.7 kg (352 lb), last menstrual period 12/24/2017, SpO2 99 %.  Assuming care from Dr. Alphonzo Lemmings.  In short, Ashley Herring is a 37 y.o. female with a chief complaint of Back Pain Refer to the original H&P for additional details.  The current plan of care is to follow up the results of the MRI.  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       Rebecka Apley, MD 01/07/18 [phone]

## 2018-03-25 ENCOUNTER — Ambulatory Visit: Payer: Medicaid Other | Admitting: Student in an Organized Health Care Education/Training Program

## 2019-01-31 ENCOUNTER — Encounter: Payer: Self-pay | Admitting: Family Medicine

## 2019-01-31 ENCOUNTER — Ambulatory Visit: Payer: Self-pay | Admitting: Family Medicine

## 2019-01-31 ENCOUNTER — Other Ambulatory Visit: Payer: Self-pay

## 2019-01-31 VITALS — BP 126/84 | HR 100 | Temp 98.9°F | Resp 16 | Ht 65.5 in | Wt 362.0 lb

## 2019-01-31 DIAGNOSIS — N63 Unspecified lump in unspecified breast: Secondary | ICD-10-CM | POA: Insufficient documentation

## 2019-01-31 DIAGNOSIS — M5442 Lumbago with sciatica, left side: Secondary | ICD-10-CM

## 2019-01-31 DIAGNOSIS — E669 Obesity, unspecified: Secondary | ICD-10-CM | POA: Insufficient documentation

## 2019-01-31 DIAGNOSIS — K120 Recurrent oral aphthae: Secondary | ICD-10-CM

## 2019-01-31 DIAGNOSIS — M5136 Other intervertebral disc degeneration, lumbar region: Secondary | ICD-10-CM

## 2019-01-31 DIAGNOSIS — G8929 Other chronic pain: Secondary | ICD-10-CM

## 2019-01-31 DIAGNOSIS — E668 Other obesity: Secondary | ICD-10-CM

## 2019-01-31 DIAGNOSIS — Q839 Congenital malformation of breast, unspecified: Secondary | ICD-10-CM

## 2019-01-31 DIAGNOSIS — M51369 Other intervertebral disc degeneration, lumbar region without mention of lumbar back pain or lower extremity pain: Secondary | ICD-10-CM | POA: Insufficient documentation

## 2019-01-31 HISTORY — DX: Unspecified lump in unspecified breast: N63.0

## 2019-01-31 MED ORDER — CYCLOBENZAPRINE HCL 10 MG PO TABS: 10.0000 mg | ORAL_TABLET | Freq: Every day | ORAL | 1 refills | Status: DC

## 2019-01-31 NOTE — Progress Notes (Signed)
Name: Ashley Herring   MRN: [id number]    DOB: [birth year]   Date:01/31/2019       Progress Note  Subjective  Chief Complaint  Chief Complaint  Patient presents with  . Establish Care  . Breast Pain    breast lump at nipple    HPI  Pt presents with concern for possible lump under the left nipple.  She also notes the LEFT nipple has not been erect when the right becomes erect.  She has not had any discharge or blood.  She endorses LEFT nipple and breast pain.  The RIGHT breast does have some soreness too.  Paternal Aunt passed from breast cancer - diagnosed at age 72.  Paternal female cousins without history of breast cancer. No known maternal family history of breast cancer.  DDD: Was seeing Ortho for pain management, but lost insurance last year.  She has been without pain medications since then.  Does get LEFT sided sciatica, no weakness.  She does not have the ability to continue with ortho, we will rx flexeril to use PRN.  Obesity: Walking - daily for about 30-40 minutes depending on how her back is feeling. Her diet is okay - has room for improvement. Eats fresh fruits and vegetables. We will check labs today.   RIGHT lower gum pain - ate a nacho 2 weeks ago and is cut her gum.  She has noticed ongoing pain and soreness, and she is having some lymph node enlargement in that area.   There are no active problems to display for this patient.  Past Surgical History:  Procedure Laterality Date  . HEMORRHOID SURGERY      Family History  Problem Relation Age of Onset  . Hypertension Mother   . Diabetes Mother   . Hypertension Father   . Diabetes Father   . Asthma Daughter   . Cancer Maternal Aunt   . Cancer Maternal Grandmother   . Brain cancer Maternal Grandmother     Social History   Socioeconomic History  . Marital status: Married    Spouse name: Dennard Nip  . Number of children: 2  . Years of education: Not on file  . Highest education level: Not on file   Occupational History  . Not on file  Social Needs  . Financial resource strain: Not hard at all  . Food insecurity:    Worry: Never true    Inability: Never true  . Transportation needs:    Medical: No    Non-medical: No  Tobacco Use  . Smoking status: Former Smoker    Last attempt to quit: 01/31/2016    Years since quitting: 3.0  . Smokeless tobacco: Never Used  Substance and Sexual Activity  . Alcohol use: No  . Drug use: Never  . Sexual activity: Yes    Partners: Male  Lifestyle  . Physical activity:    Days per week: 7 days    Minutes per session: 40 min  . Stress: To some extent  Relationships  . Social connections:    Talks on phone: More than three times a week    Gets together: More than three times a week    Attends religious service: 1 to 4 times per year    Active member of club or organization: Not on file    Attends meetings of clubs or organizations: 1 to 4 times per year    Relationship status: Married  . Intimate partner violence:    Fear of current  or ex partner: No    Emotionally abused: No    Physically abused: No    Forced sexual activity: No  Other Topics Concern  . Not on file  Social History Narrative   Have 2 children from previous relationships now married and they are trying to get pregnant     Current Outpatient Medications:  .  diazepam (VALIUM) 2 MG tablet, Take 1 tablet (2 mg total) by mouth every 6 (six) hours as needed for anxiety. (Patient not taking: Reported on 01/31/2019), Disp: 12 tablet, Rfl: 0 .  HYDROcodone-acetaminophen (NORCO) 5-325 MG tablet, Take 1 tablet by mouth every 4 (four) hours as needed for moderate pain. (Patient not taking: Reported on 01/31/2019), Disp: 6 tablet, Rfl: 0 .  ibuprofen (ADVIL,MOTRIN) 800 MG tablet, Take 1 tablet (800 mg total) by mouth every 8 (eight) hours as needed for mild pain or moderate pain. (Patient not taking: Reported on 03/15/2017), Disp: 15 tablet, Rfl: 0 .  oxyCODONE-acetaminophen (ROXICET)  5-325 MG per tablet, Take 1 tablet by mouth every 8 (eight) hours as needed for moderate pain or severe pain (Do not drive or operate heavy machinery while taking as can cause drowsiness.). (Patient not taking: Reported on 03/15/2017), Disp: 9 tablet, Rfl: 0 .  penicillin v potassium (VEETID) 500 MG tablet, Take 1 tablet (500 mg total) by mouth 4 (four) times daily. (Patient not taking: Reported on 03/15/2017), Disp: 40 tablet, Rfl: 0 .  predniSONE (STERAPRED UNI-PAK 21 TAB) 10 MG (21) TBPK tablet, Take 6 tabs on day 1 Take 5 tabs on day 2 Take 4 tabs on day 3 Take 3 tabs on day 4 Take 2 tabs on day 5 Take 1 tab on day 6 (Patient not taking: Reported on 01/31/2019), Disp: 21 tablet, Rfl: 0 .  promethazine (PHENERGAN) 12.5 MG tablet, Take 1 tablet (12.5 mg total) by mouth every 6 (six) hours as needed for nausea or vomiting. (Patient not taking: Reported on 01/31/2019), Disp: 12 tablet, Rfl: 0  Allergies  Allergen Reactions  . Peanuts [Peanut Oil] Swelling    I personally reviewed active problem list, medication list, allergies, family history, social history, health maintenance, lab results with the patient/caregiver today.   ROS  Ten systems reviewed and is negative except as mentioned in HPI  Objective  Vitals:   01/31/19 1011  BP: 126/84  Pulse: 100  Resp: 16  Temp: 98.9 F (37.2 C)  TempSrc: Oral  SpO2: 99%  Weight: (!) 362 lb (164.2 kg)  Height: 5' 5.5" (1.664 m)    Body mass index is 59.32 kg/m.  Physical Exam  Constitutional: Patient appears well-developed and well-nourished. No distress.  HENT: Head: Normocephalic and atraumatic. Ears: bilateral TMs with no erythema or effusion; Nose: Nose normal. Mouth/Throat: Oropharynx is clear and moist. No oropharyngeal exudate or tonsillar swelling. The right lower medial gum has 2 small aphthous ulcers present, no obvious infection. Eyes: Conjunctivae and EOM are normal. No scleral icterus.  Pupils are equal, round, and reactive to  light.  Neck: Normal range of motion. Neck supple. No JVD present. No thyromegaly present.  Cardiovascular: Normal rate, regular rhythm and normal heart sounds.  No murmur heard. No BLE edema. Pulmonary/Chest: Effort normal and breath sounds normal. No respiratory distress. Breast: Bilateral breasts without lump or mass, nipples normal shape and without discharge.  The LEFT nipple has tiny cyst at the 3:00 position. Musculoskeletal: Normal range of motion, no joint effusions. No gross deformities Neurological: Pt is alert and oriented to  person, place, and time. No cranial nerve deficit. Coordination, balance, strength, speech and gait are normal.  Skin: Skin is warm and dry. No rash noted. No erythema.  Psychiatric: Patient has a normal mood and affect. behavior is normal. Judgment and thought content normal.  No results found for this or any previous visit (from the past 72 hour(s)).   PHQ2/9: Depression screen PHQ 2/9 01/31/2019  Decreased Interest 0  Down, Depressed, Hopeless 0  PHQ - 2 Score 0  Altered sleeping 3  Tired, decreased energy 3  Change in appetite 1  Feeling bad or failure about yourself  0  Trouble concentrating 0  Moving slowly or fidgety/restless 0  Suicidal thoughts 0  PHQ-9 Score 7  Difficult doing work/chores Not difficult at all   PHQ-2/9 Result is positive.  Will address at follow up.  Fall Risk: Fall Risk  01/31/2019  Falls in the past year? 0  Number falls in past yr: 0  Injury with Fall? 0  Follow up Falls evaluation completed   Assessment & Plan  1. Breast lump - MM Digital Diagnostic Unilat L; Future - MM Digital Diagnostic Unilat R; Future  2. Nipple anomaly - MM Digital Diagnostic Unilat L; Future - MM Digital Diagnostic Unilat R; Future  3. DDD (degenerative disc disease), lumbar - Flexeril per orders.   4. Chronic left-sided low back pain with left-sided sciatica - Flexeril per orders, encouraged weight loss as well.  5. Extreme  obesity - TSH - COMPLETE METABOLIC PANEL WITH GFR - Lipid panel  6. Aphthous ulcer of mouth - Advised to call back if not improving in 1 week. Will refer to dentist.

## 2019-02-01 ENCOUNTER — Encounter: Payer: Self-pay | Admitting: Family Medicine

## 2019-02-01 LAB — COMPLETE METABOLIC PANEL WITH GFR
AG Ratio: 1.4 (calc) (ref 1.0–2.5)
ALT: 13 U/L (ref 6–29)
AST: 16 U/L (ref 10–30)
Albumin: 4.1 g/dL (ref 3.6–5.1)
Alkaline phosphatase (APISO): 48 U/L (ref 31–125)
BUN: 11 mg/dL (ref 7–25)
CO2: 23 mmol/L (ref 20–32)
Calcium: 9.4 mg/dL (ref 8.6–10.2)
Chloride: 105 mmol/L (ref 98–110)
Creat: 0.64 mg/dL (ref 0.50–1.10)
GFR, Est African American: 133 mL/min/{1.73_m2} (ref >=60)
GFR, Est Non African American: 115 mL/min/{1.73_m2} (ref >=60)
Globulin: 3 g/dL (calc) (ref 1.9–3.7)
Glucose, Bld: 101 mg/dL — ABNORMAL HIGH (ref 65–99)
Potassium: 4.1 mmol/L (ref 3.5–5.3)
Sodium: 137 mmol/L (ref 135–146)
Total Bilirubin: 0.3 mg/dL (ref 0.2–1.2)
Total Protein: 7.1 g/dL (ref 6.1–8.1)

## 2019-02-01 LAB — LIPID PANEL
Cholesterol: 217 mg/dL — ABNORMAL HIGH (ref <200)
HDL: 40 mg/dL — ABNORMAL LOW (ref >=50)
LDL Cholesterol (Calc): 156 mg/dL (calc) — ABNORMAL HIGH
Non-HDL Cholesterol (Calc): 177 mg/dL (calc) — ABNORMAL HIGH (ref <130)
Total CHOL/HDL Ratio: 5.4 (calc) — ABNORMAL HIGH (ref <5.0)
Triglycerides: 98 mg/dL (ref <150)

## 2019-02-01 LAB — TSH: TSH: 1.04 mIU/L

## 2019-02-10 ENCOUNTER — Other Ambulatory Visit: Payer: Self-pay

## 2019-02-10 ENCOUNTER — Telehealth: Payer: Self-pay

## 2019-02-10 DIAGNOSIS — N63 Unspecified lump in unspecified breast: Secondary | ICD-10-CM

## 2019-02-10 NOTE — Progress Notes (Signed)
Please cancel old orders and sign the ones that are pending, then this patient will be able to be scheduled.  Thanks

## 2019-02-10 NOTE — Telephone Encounter (Signed)
Copied from [place] [phone]. Topic: General - Inquiry >> Feb 10, 2019  3:06 PM Virl Axe D wrote: Reason for CRM: Pt stated she was supposed to be referred for a mammogram and had not heard anything. Please advise. CB#[phone]

## 2019-02-11 NOTE — Telephone Encounter (Signed)
See other telephone encounter from 02/11/2019

## 2019-02-11 NOTE — Progress Notes (Signed)
Orders signed. Please let patient know to call to schedule with [facility]. Thanks!

## 2019-02-26 ENCOUNTER — Encounter: Payer: Self-pay | Admitting: *Deleted

## 2019-02-26 ENCOUNTER — Other Ambulatory Visit: Payer: Self-pay | Admitting: Family Medicine

## 2019-02-26 ENCOUNTER — Ambulatory Visit
Admission: RE | Admit: 2019-02-26 | Discharge: 2019-02-26 | Disposition: A | Discharge status: Home / Self Care | Payer: Self-pay | Source: Ambulatory Visit | Attending: Family Medicine | Admitting: Family Medicine

## 2019-02-26 ENCOUNTER — Ambulatory Visit: Payer: Self-pay | Attending: Oncology | Admitting: *Deleted

## 2019-02-26 ENCOUNTER — Other Ambulatory Visit: Payer: Self-pay

## 2019-02-26 VITALS — BP 136/81 | HR 83 | Temp 97.9°F | Ht 65.0 in | Wt 364.0 lb

## 2019-02-26 DIAGNOSIS — N63 Unspecified lump in unspecified breast: Secondary | ICD-10-CM

## 2019-02-26 NOTE — Progress Notes (Signed)
Tried to call patient to review her mammogram and ultrasound results.  I have left her a message to return my call.  I have also mailed her a letter with an appointment for 9/23/230 @ 10:00 for a repeat clinical breast exam.  HSIS to [place].

## 2019-02-26 NOTE — Progress Notes (Signed)
  Subjective:     Patient ID: Ashley Herring, female   DOB: Jan 06, 1981, 38 y.o.   MRN: [id number]  HPI   Review of Systems     Objective:   Physical Exam Chest:     Breasts:        Right: No swelling, bleeding, inverted nipple, mass, nipple discharge, skin change or tenderness.        Left: Mass present. No swelling, bleeding, inverted nipple, nipple discharge, skin change or tenderness.    Lymphadenopathy:     Upper Body:     Right upper body: No supraclavicular or axillary adenopathy.     Left upper body: No supraclavicular or axillary adenopathy.        Assessment:     38 year old Black female referred to [facility] for financial assistance for further evaluation of a left breast mass.  Patient states she found the lump about a month ago in May.  States she had some left breast pain and did a self exam and found a lump in the left nipple.  On clinical breast exam there is no dominant mass, skin changes, nipple discharge or lymphadenopathy.  I asked the patient to show me the area of concern.  She had me to squeeze the left nipple and I could palpate a bb sized nodule in the left nipple.  Taught self breast awareness.  Patient has a family history of breast cancer in a paternal aunt in her late 93's and deceased at 68. Patient has been screened for eligibility.  She does not have any insurance, Medicare or Medicaid.  She also meets financial eligibility.  Hand-out given on the Affordable Care Act.  Risk Assessment    Risk Scores      02/26/2019   Last edited by: Theodore Demark, RN   5-year risk: 0.3 %   Lifetime risk: 7.6 %            Plan:     Will get bilateral diagnostic mammogram and ultrasound.  If no findings on imaging will have patient return for repeat clinical breast exam in 3 months.

## 2019-03-17 ENCOUNTER — Emergency Department: Payer: Medicaid Other

## 2019-03-17 ENCOUNTER — Other Ambulatory Visit: Payer: Self-pay

## 2019-03-17 ENCOUNTER — Emergency Department
Admission: EM | Admit: 2019-03-17 | Discharge: 2019-03-18 | Disposition: A | Discharge status: Home / Self Care | Payer: Medicaid Other | Attending: Emergency Medicine | Admitting: Emergency Medicine

## 2019-03-17 DIAGNOSIS — H15002 Unspecified scleritis, left eye: Secondary | ICD-10-CM | POA: Diagnosis not present

## 2019-03-17 DIAGNOSIS — Z91010 Allergy to peanuts: Secondary | ICD-10-CM | POA: Insufficient documentation

## 2019-03-17 DIAGNOSIS — Z79899 Other long term (current) drug therapy: Secondary | ICD-10-CM | POA: Diagnosis not present

## 2019-03-17 DIAGNOSIS — Z87891 Personal history of nicotine dependence: Secondary | ICD-10-CM | POA: Insufficient documentation

## 2019-03-17 DIAGNOSIS — H5712 Ocular pain, left eye: Secondary | ICD-10-CM | POA: Diagnosis present

## 2019-03-17 LAB — CBC
HCT: 35.7 % — ABNORMAL LOW (ref 36.0–46.0)
Hemoglobin: 11.4 g/dL — ABNORMAL LOW (ref 12.0–15.0)
MCH: 27.2 pg (ref 26.0–34.0)
MCHC: 31.9 g/dL (ref 30.0–36.0)
MCV: 85.2 fL (ref 80.0–100.0)
Platelets: 481 10*3/uL — ABNORMAL HIGH (ref 150–400)
RBC: 4.19 MIL/uL (ref 3.87–5.11)
RDW: 14.6 % (ref 11.5–15.5)
WBC: 10.8 10*3/uL — ABNORMAL HIGH (ref 4.0–10.5)
nRBC: 0 % (ref 0.0–0.2)

## 2019-03-17 LAB — RENAL FUNCTION PANEL
Albumin: 4.2 g/dL (ref 3.5–5.0)
Anion gap: 10 (ref 5–15)
BUN: 12 mg/dL (ref 6–20)
CO2: 25 mmol/L (ref 22–32)
Calcium: 8.9 mg/dL (ref 8.9–10.3)
Chloride: 102 mmol/L (ref 98–111)
Creatinine, Ser: 0.56 mg/dL (ref 0.44–1.00)
GFR calc Af Amer: >60 mL/min (ref >60)
GFR calc non Af Amer: >60 mL/min (ref >60)
Glucose, Bld: 94 mg/dL (ref 70–99)
Phosphorus: 4.1 mg/dL (ref 2.5–4.6)
Potassium: 3.8 mmol/L (ref 3.5–5.1)
Sodium: 137 mmol/L (ref 135–145)

## 2019-03-17 LAB — SEDIMENTATION RATE: Sed Rate: 79 mm/hr — ABNORMAL HIGH (ref 0–20)

## 2019-03-17 MED ORDER — NAPROXEN 500 MG PO TABS: 500.0000 mg | ORAL_TABLET | Freq: Two times a day (BID) | ORAL | 0 refills | Status: AC

## 2019-03-17 MED ORDER — ACETAMINOPHEN 325 MG PO TABS
650.0000 mg | ORAL_TABLET | Freq: Once | ORAL | Status: AC
Start: 2019-03-17 — End: 2019-03-17
  Administered 2019-03-17: 650 mg via ORAL
  Filled 2019-03-17: qty 2

## 2019-03-17 MED ORDER — PANTOPRAZOLE SODIUM 40 MG PO TBEC: 40.0000 mg | DELAYED_RELEASE_TABLET | Freq: Every day | ORAL | 0 refills | Status: DC

## 2019-03-17 MED ORDER — NAPROXEN 500 MG PO TABS
500.0000 mg | ORAL_TABLET | Freq: Once | ORAL | Status: AC
  Administered 2019-03-17: 500 mg via ORAL
  Filled 2019-03-17: qty 1

## 2019-03-17 MED ORDER — GADOBUTROL 1 MMOL/ML IV SOLN: 6.0000 mL | Freq: Once | INTRAVENOUS | Status: DC | PRN

## 2019-03-17 MED ORDER — ONDANSETRON HCL 4 MG/2ML IJ SOLN
4.0000 mg | Freq: Once | INTRAMUSCULAR | Status: AC
  Administered 2019-03-17: 4 mg via INTRAVENOUS
  Filled 2019-03-17: qty 2

## 2019-03-17 MED ORDER — GADOBUTROL 1 MMOL/ML IV SOLN
10.0000 mL | Freq: Once | INTRAVENOUS | Status: AC | PRN
  Administered 2019-03-17: 10 mL via INTRAVENOUS

## 2019-03-17 MED ORDER — MORPHINE SULFATE (PF) 4 MG/ML IV SOLN
4.0000 mg | Freq: Once | INTRAVENOUS | Status: AC
  Administered 2019-03-17: 4 mg via INTRAVENOUS
  Filled 2019-03-17: qty 1

## 2019-03-17 MED ORDER — PREDNISOLONE ACETATE 1 % OP SUSP: OPHTHALMIC | 0 refills | Status: DC

## 2019-03-17 NOTE — ED Notes (Signed)
Pt leaving for MRI.  

## 2019-03-17 NOTE — ED Provider Notes (Signed)
[facility] Emergency Department Provider Note  Time seen: 11:08 PM  I have reviewed the triage vital signs and the nursing notes.   HISTORY  Chief Complaint Headache   HPI Ashley Herring is a 38 y.o. female with no significant past medical history presents to the emergency department for headache left eye pain referred by ophthalmology.  According to the patient for the past 10 days she has been experiencing fairly severe headaches intermittently.  For the past 1 week she has been experiencing intermittent severe pain in the left eye which has become more constant along with redness in the eye.  Patient denies any fever.  No cough congestion or shortness of breath.  Patient states the pain became so bad she finally made an appointment with [facility] was seen today by Dr. Lara MulchHarrow.  He performed an eye examination and was concerned with a finding of papilledema possible scleritis.  Referred the patient to the emergency department for further work-up.   Past Medical History:  Diagnosis Date  . Back pain     Patient Active Problem List   Diagnosis Date Noted  . Breast lump 01/31/2019  . DDD (degenerative disc disease), lumbar 01/31/2019  . Chronic left-sided low back pain with left-sided sciatica 01/31/2019  . Extreme obesity 01/31/2019    Past Surgical History:  Procedure Laterality Date  . HEMORRHOID SURGERY      Prior to Admission medications   Medication Sig Start Date End Date Taking? Authorizing Provider  cyclobenzaprine (FLEXERIL) 10 MG tablet Take 1 tablet (10 mg total) by mouth at bedtime. 01/31/19   Doren CustardBoyce, Emily E, FNP    Allergies  Allergen Reactions  . Peanuts [Peanut Oil] Swelling    Family History  Problem Relation Age of Onset  . Hypertension Mother   . Diabetes Mother   . Hypertension Father   . Diabetes Father   . Asthma Daughter   . Breast cancer Paternal Aunt 30       Deceased from metastasis  . Cancer Maternal  Grandmother   . Brain cancer Maternal Grandmother     Social History Social History   Tobacco Use  . Smoking status: Former Smoker    Quit date: 01/31/2016    Years since quitting: 3.1  . Smokeless tobacco: Never Used  Substance Use Topics  . Alcohol use: No  . Drug use: Never    Review of Systems Constitutional: Negative for fever. Eyes: Left eye pain and redness Cardiovascular: Negative for chest pain. Respiratory: Negative for shortness of breath. Gastrointestinal: Negative for abdominal pain, vomiting Musculoskeletal: Negative for musculoskeletal complaints Skin: Negative for skin complaints  Neurological: Moderate intermittent headaches x10 days. All other ROS negative  ____________________________________________   PHYSICAL EXAM:  VITAL SIGNS: ED Triage Vitals  Enc Vitals Group     BP 03/17/19 1822 (!) 148/106     Pulse Rate 03/17/19 1822 94     Resp 03/17/19 1822 16     Temp 03/17/19 1822 98.9 F (37.2 C)     Temp Source 03/17/19 1822 Oral     SpO2 03/17/19 1822 100 %     Weight 03/17/19 1823 (!) 350 lb (158.8 kg)     Height 03/17/19 1823 5\' 5"  (1.651 m)     Head Circumference --      Peak Flow --      Pain Score 03/17/19 1823 10     Pain Loc --      Pain Edu? --  Excl. in [place]? --    Constitutional: Alert and oriented. Well appearing and in no distress. Eyes: Mild periorbital edema around the left eye with scleral injection, moderate tenderness to palpation of the left eye.  Extraocular muscles are intact.  Pupils are equal and reactive. ENT      Head: Normocephalic and atraumatic.      Mouth/Throat: Mucous membranes are moist. Cardiovascular: Normal rate, regular rhythm. Respiratory: Normal respiratory effort without tachypnea nor retractions. Breath sounds are clear Gastrointestinal: Soft and nontender. No distention. Musculoskeletal: Nontender with normal range of motion in all extremities.  Neurologic:  Normal speech and language. No gross  focal neurologic deficits Skin:  Skin is warm, dry and intact.  Psychiatric: Mood and affect are normal.   ____________________________________________   RADIOLOGY  MRI is pending.  ____________________________________________   INITIAL IMPRESSION / ASSESSMENT AND PLAN / ED COURSE  Pertinent labs & imaging results that were available during my care of the patient were reviewed by me and considered in my medical decision making (see chart for details).   Patient presents emergency department referred from ophthalmology for further work-up.  Patient has been experiencing headaches over the past 10 days with left eye pain and redness over the past 7 days.  I discussed the patient with Dr. Neville Route over the phone, he has requested many labs to be ordered most of which are send out lab that he will follow-up with in the office.  He states he did perform a full eye exam in the office including tonometry pressures.  States he mainly sent the patient to the emergency department for MRIs of the brain and orbits to rule out concerning finding.  If the MRIs are negative for acute finding patient can be discharged on naproxen, Pred forte drops, Protonix.  Patient agreeable to plan of care.  MRIs are pending at this time.  Patient care signed out to oncoming physician.  [name] was evaluated in Emergency Department on 03/17/2019 for the symptoms described in the history of present illness. She was evaluated in the context of the global COVID-19 pandemic, which necessitated consideration that the patient might be at risk for infection with the SARS-CoV-2 virus that causes COVID-19. Institutional protocols and algorithms that pertain to the evaluation of patients at risk for COVID-19 are in a state of rapid change based on information released by regulatory bodies including the CDC and federal and state organizations. These policies and algorithms were followed during the patient's care in the  ED.  ____________________________________________   FINAL CLINICAL IMPRESSION(S) / ED DIAGNOSES  Headache Left eye pain   Harvest Dark, MD 03/17/19 2312

## 2019-03-17 NOTE — ED Notes (Signed)
Per triage RN 3 SST tubes sent to lab. This RN sent 1 more SST tube as lab requested.

## 2019-03-17 NOTE — ED Notes (Signed)
Lab requesting another SST tube again. Will provide once pt back to room.

## 2019-03-17 NOTE — ED Notes (Signed)
Another SST sent to lab.

## 2019-03-17 NOTE — ED Triage Notes (Signed)
Pt to the er for labs and MRI. Pt seen by Dr Neville Route who wanted labs and a MRI done tonight. Called and verified with MD that pt was to be seen in the ER and their office did not call in outpatient orders. Dr Neville Route states pt is to be seen in the ER.

## 2019-03-17 NOTE — Discharge Instructions (Addendum)
Please start the medicines prescribed to you by the eye doctor.  Call the office in the morning for follow-up appointment.  Return to the ER for worsening symptoms, persistent vomiting, difficulty breathing or other concerns.

## 2019-03-18 LAB — C-REACTIVE PROTEIN: CRP: <0.8 mg/dL (ref <1.0)

## 2019-03-18 NOTE — ED Provider Notes (Signed)
-----------------------------------------   12:37 AM on 03/18/2019 -----------------------------------------  MRI brain and orbits interpreted per Dr. Toney Reil: MRI head:    1. No acute intracranial process or abnormal enhancement identified.  2. Few punctate bifrontal white matter hyperintensities, possibly  mild chronic microvascular ischemic changes or migraine headache. No  specific findings for demyelination.    MRI orbits:    1. Normal MRI of the orbits.   Rest of lab work is send out test.  Updated patient who will be seen by ophthalmology Dr. Neville Route in the morning.  Prescriptions were E prescribed by previous physician.  Strict return precautions given.  Patient verbalizes understanding agrees with plan of care.   Paulette Blanch, MD 03/18/19 [phone]

## 2019-03-19 LAB — ANTINUCLEAR ANTIBODIES, IFA: ANA Ab, IFA: NEGATIVE

## 2019-03-19 LAB — RPR: RPR Ser Ql: NONREACTIVE

## 2019-03-19 LAB — MPO/PR-3 (ANCA) ANTIBODIES
ANCA Proteinase 3: <3.5 U/mL (ref 0.0–3.5)
Myeloperoxidase Abs: <9 U/mL (ref 0.0–9.0)

## 2019-03-20 LAB — CYCLIC CITRUL PEPTIDE ANTIBODY, IGG/IGA: CCP Antibodies IgG/IgA: 3 units (ref 0–19)

## 2019-03-25 DIAGNOSIS — H15002 Unspecified scleritis, left eye: Secondary | ICD-10-CM | POA: Insufficient documentation

## 2019-03-25 DIAGNOSIS — M47816 Spondylosis without myelopathy or radiculopathy, lumbar region: Secondary | ICD-10-CM | POA: Insufficient documentation

## 2019-03-25 DIAGNOSIS — R700 Elevated erythrocyte sedimentation rate: Secondary | ICD-10-CM | POA: Insufficient documentation

## 2019-04-04 ENCOUNTER — Ambulatory Visit (INDEPENDENT_AMBULATORY_CARE_PROVIDER_SITE_OTHER): Payer: Medicaid Other | Admitting: Family Medicine

## 2019-04-04 ENCOUNTER — Other Ambulatory Visit: Payer: Self-pay

## 2019-04-04 ENCOUNTER — Other Ambulatory Visit (HOSPITAL_COMMUNITY)
Admission: RE | Admit: 2019-04-04 | Discharge: 2019-04-04 | Disposition: A | Discharge status: Home / Self Care | Payer: Medicaid Other | Source: Ambulatory Visit | Attending: Family Medicine | Admitting: Family Medicine

## 2019-04-04 ENCOUNTER — Encounter: Payer: Self-pay | Admitting: Family Medicine

## 2019-04-04 VITALS — BP 138/82 | HR 99 | Temp 97.5°F | Resp 16 | Ht 65.0 in | Wt 365.2 lb

## 2019-04-04 DIAGNOSIS — Z01419 Encounter for gynecological examination (general) (routine) without abnormal findings: Secondary | ICD-10-CM | POA: Diagnosis present

## 2019-04-04 DIAGNOSIS — E782 Mixed hyperlipidemia: Secondary | ICD-10-CM

## 2019-04-04 DIAGNOSIS — E785 Hyperlipidemia, unspecified: Secondary | ICD-10-CM | POA: Insufficient documentation

## 2019-04-04 NOTE — Progress Notes (Signed)
Name: Ashley Herring   MRN: [id number]    DOB: 1981/09/04   Date:04/04/2019       Progress Note  Subjective  Chief Complaint  Chief Complaint  Patient presents with  . Annual Exam    with pap    HPI  Patient presents for annual CPE.  Diet: Trying to do lower carbohydrate and intermittent fasting diet. Exercise: Has been going on more walks lately.   USPSTF grade A and B recommendations    Office Visit from 04/04/2019 in [facility]  AUDIT-C Score  1    Occasional Use  Depression: Phq 9 is  negative Depression screen [facility] 2/9 04/04/2019 01/31/2019  Decreased Interest 0 0  Down, Depressed, Hopeless 0 0  PHQ - 2 Score 0 0  Altered sleeping 0 3  Tired, decreased energy 0 3  Change in appetite 0 1  Feeling bad or failure about yourself  0 0  Trouble concentrating 0 0  Moving slowly or fidgety/restless 0 0  Suicidal thoughts 0 0  PHQ-9 Score 0 7  Difficult doing work/chores Not difficult at all Not difficult at all   Hypertension: BP Readings from Last 3 Encounters:  04/04/19 138/82  03/18/19 103/75  02/26/19 136/81   Obesity: Wt Readings from Last 3 Encounters:  04/04/19 (!) 365 lb 3.2 oz (165.7 kg)  03/17/19 (!) 350 lb (158.8 kg)  02/26/19 (!) 364 lb (165.1 kg)   BMI Readings from Last 3 Encounters:  04/04/19 60.77 kg/m  03/17/19 58.24 kg/m  02/26/19 60.57 kg/m   Married Hep C Screening: Had recent screening with [facility] and was negative July 2020 STD testing and prevention (HIV/chl/gon/syphilis): Had recent HIV and RPR which were negative.  We will check gon/chlam today with pap. Intimate partner violence: No concerns Sexual History/Pain during Intercourse: She is sexually active; no pain with intercourse Menstrual History/LMP/Abnormal Bleeding: No concerns; still having regular periods Incontinence Symptoms: No concerns  Advanced Care Planning: A voluntary discussion about advance care planning including the explanation and discussion of  advance directives.  Discussed health care proxy and Living will, and the patient was able to identify a health care proxy as Dwaine Deter.  Patient does not have a living will at present time. If patient does have living will, I have requested they bring this to the clinic to be scanned in to their chart.  Breast cancer: Had recent mammogram and [place] due to nipple abnormality found in May 2020; [place] and mammo were negative for malignancy, but was recommended to follow up in 6 months which she is aware of. No results found for: HMMAMMO  BRCA gene screening: Paternal Aunt,  Cervical cancer screening: We will check today   Osteoporosis Screening: No known family history; weight bearing exercising, sun exposure/adequate vitamin D.  No results found for: HMDEXASCAN  Lipids: No family history of MI or CVA at age <68yo. Lab Results  Component Value Date   CHOL 217 (H) 01/31/2019   Lab Results  Component Value Date   HDL 40 (L) 01/31/2019   Lab Results  Component Value Date   LDLCALC 156 (H) 01/31/2019   Lab Results  Component Value Date   TRIG 98 01/31/2019   Lab Results  Component Value Date   CHOLHDL 5.4 (H) 01/31/2019   No results found for: LDLDIRECT  Glucose:  Glucose  Date Value Ref Range Status  06/26/2013 84 65 - 99 mg/dL Final   Glucose, Bld  Date Value Ref Range Status  03/17/2019  94 70 - 99 mg/dL Final  01/31/2019 101 (H) 65 - 99 mg/dL Final    Comment:    .            Fasting reference interval . For someone without known diabetes, a glucose value between 100 and 125 mg/dL is consistent with prediabetes and should be confirmed with a follow-up test. .   01/06/2018 99 65 - 99 mg/dL Final    Skin cancer: No concerning lesions Colorectal cancer: Denies personal history of colorectal cancer, no changes in BM's - no blood in stool, dark and tarry stool, mucus in stool, or constipation/diarrhea. PGF with history CRC as an elderly patient, no early screening  warranted at this time.  Lung cancer: Former smoker - quit 3 years Low Dose CT Chest recommended if Age 13-80 years, 30 pack-year currently smoking OR have quit w/in 15years. Patient does not qualify.   NAT:FTDDUK chest pain, shortness of breath, or palpitations.  Patient Active Problem List   Diagnosis Date Noted  . Breast lump 01/31/2019  . DDD (degenerative disc disease), lumbar 01/31/2019  . Chronic left-sided low back pain with left-sided sciatica 01/31/2019  . Extreme obesity 01/31/2019    Past Surgical History:  Procedure Laterality Date  . HEMORRHOID SURGERY      Family History  Problem Relation Age of Onset  . Hypertension Mother   . Diabetes Mother   . Hypertension Father   . Diabetes Father   . Asthma Daughter   . Breast cancer Paternal Aunt 66       Deceased from metastasis  . Cancer Maternal Grandmother   . Brain cancer Maternal Grandmother     Social History   Socioeconomic History  . Marital status: Married    Spouse name: Cornelia Copa  . Number of children: 2  . Years of education: Not on file  . Highest education level: Not on file  Occupational History  . Not on file  Social Needs  . Financial resource strain: Not hard at all  . Food insecurity    Worry: Never true    Inability: Never true  . Transportation needs    Medical: No    Non-medical: No  Tobacco Use  . Smoking status: Former Smoker    Quit date: 01/31/2016    Years since quitting: 3.1  . Smokeless tobacco: Never Used  Substance and Sexual Activity  . Alcohol use: No  . Drug use: Never  . Sexual activity: Yes    Partners: Male  Lifestyle  . Physical activity    Days per week: 7 days    Minutes per session: 40 min  . Stress: To some extent  Relationships  . Social connections    Talks on phone: More than three times a week    Gets together: More than three times a week    Attends religious service: 1 to 4 times per year    Active member of club or organization: Not on file     Attends meetings of clubs or organizations: 1 to 4 times per year    Relationship status: Married  . Intimate partner violence    Fear of current or ex partner: No    Emotionally abused: No    Physically abused: No    Forced sexual activity: No  Other Topics Concern  . Not on file  Social History Narrative   Have 2 children from previous relationships now married and they are trying to get pregnant  Current Outpatient Medications:  .  cyclobenzaprine (FLEXERIL) 10 MG tablet, Take 1 tablet (10 mg total) by mouth at bedtime., Disp: 30 tablet, Rfl: 1 .  cyclopentolate (CYCLODRYL) 2 % ophthalmic solution, USE 1 DROP(S) IN LEFT EYE 2 TIMES A DAY FOR 14 DAYS, Disp: , Rfl:  .  prednisoLONE acetate (PRED FORTE) 1 % ophthalmic suspension, Place 1 to 2 drops in the left eye 6 times daily for the next 10 days, Disp: 5 mL, Rfl: 0 .  erythromycin ophthalmic ointment, APPLY A SMALL AMOUNT IN LEFT EYE ONCE IN THE EVENING, Disp: , Rfl:  .  pantoprazole (PROTONIX) 40 MG tablet, Take 1 tablet (40 mg total) by mouth daily. (Patient not taking: Reported on 04/04/2019), Disp: 30 tablet, Rfl: 0  Allergies  Allergen Reactions  . Peanuts [Peanut Oil] Swelling     ROS  Constitutional: Negative for fever or weight change.  Respiratory: Negative for cough and shortness of breath.   Cardiovascular: Negative for chest pain or palpitations.  Gastrointestinal: Negative for abdominal pain, no bowel changes.  Musculoskeletal: Negative for gait problem or joint swelling.  Skin: Negative for rash.  Neurological: Negative for dizziness or headache.  No other specific complaints in a complete review of systems (except as listed in HPI above).  Objective  Vitals:   04/04/19 0915  BP: 138/82  Pulse: 99  Resp: 16  Temp: (!) 97.5 F (36.4 C)  TempSrc: Oral  SpO2: 94%  Weight: (!) 365 lb 3.2 oz (165.7 kg)  Height: _0  (1.651 m)    Body mass index is 60.77 kg/m.  Physical Exam  Constitutional:  Patient appears well-developed and well-nourished. No distress.  HENT: Head: Normocephalic and atraumatic. Ears: B TMs ok, no erythema or effusion; Nose: Nose normal. Mouth/Throat: Oropharynx is clear and moist. No oropharyngeal exudate.  Eyes: Conjunctivae and EOM are normal. Pupils are equal, round, and reactive to light. No scleral icterus.  Neck: Normal range of motion. Neck supple. No JVD present. No thyromegaly present.  Cardiovascular: Normal rate, regular rhythm and normal heart sounds.  No murmur heard. No BLE edema. Pulmonary/Chest: Effort normal and breath sounds normal. No respiratory distress. Abdominal: Soft. Bowel sounds are normal, no distension. There is no tenderness. no masses Breast: no lumps or masses, no nipple discharge or rashes FEMALE GENITALIA:  External genitalia normal External urethra normal Vaginal vault normal without discharge or lesions Cervix normal without discharge or lesions Bimanual exam normal without masses RECTAL: no rectal masses or hemorrhoids Musculoskeletal: Normal range of motion, no joint effusions. No gross deformities Neurological: he is alert and oriented to person, place, and time. No cranial nerve deficit. Coordination, balance, strength, speech and gait are normal.  Skin: Skin is warm and dry. No rash noted. No erythema.  Psychiatric: Patient has a normal mood and affect. behavior is normal. Judgment and thought content normal.   Recent Results (from the past 2160 hour(s))  TSH     Status: None   Collection Time: 01/31/19 10:59 AM  Result Value Ref Range   TSH 1.04 mIU/L    Comment:           Reference Range .           > or = 20 Years  0.40-4.50 .                Pregnancy Ranges           First trimester    0.26-2.66  Second trimester   0.55-2.73           Third trimester    0.43-2.91   COMPLETE METABOLIC PANEL WITH GFR     Status: Abnormal   Collection Time: 01/31/19 10:59 AM  Result Value Ref Range   Glucose, Bld  101 (H) 65 - 99 mg/dL    Comment: .            Fasting reference interval . For someone without known diabetes, a glucose value between 100 and 125 mg/dL is consistent with prediabetes and should be confirmed with a follow-up test. .    BUN 11 7 - 25 mg/dL   Creat 0.64 0.50 - 1.10 mg/dL   GFR, Est Non African American 115 > OR = 60 mL/min/1.14m   GFR, Est African American 133 > OR = 60 mL/min/1.778m  BUN/Creatinine Ratio NOT APPLICABLE 6 - 22 (calc)   Sodium 137 135 - 146 mmol/L   Potassium 4.1 3.5 - 5.3 mmol/L   Chloride 105 98 - 110 mmol/L   CO2 23 20 - 32 mmol/L   Calcium 9.4 8.6 - 10.2 mg/dL   Total Protein 7.1 6.1 - 8.1 g/dL   Albumin 4.1 3.6 - 5.1 g/dL   Globulin 3.0 1.9 - 3.7 g/dL (calc)   AG Ratio 1.4 1.0 - 2.5 (calc)   Total Bilirubin 0.3 0.2 - 1.2 mg/dL   Alkaline phosphatase (APISO) 48 31 - 125 U/L   AST 16 10 - 30 U/L   ALT 13 6 - 29 U/L  Lipid panel     Status: Abnormal   Collection Time: 01/31/19 10:59 AM  Result Value Ref Range   Cholesterol 217 (H) <200 mg/dL   HDL 40 (L) > OR = 50 mg/dL   Triglycerides 98 <150 mg/dL   LDL Cholesterol (Calc) 156 (H) mg/dL (calc)    Comment: Reference range: <100 . Desirable range <100 mg/dL for primary prevention;   <70 mg/dL for patients with CHD or diabetic patients  with > or = 2 CHD risk factors. . Marland KitchenDL-C is now calculated using the Martin-Hopkins  calculation, which is a validated novel method providing  better accuracy than the Friedewald equation in the  estimation of LDL-C.  MaCresenciano Genret al. JAAnnamaria Helling203614;431([id number]  (http://education.[facility].com/faq/FAQ164)    Total CHOL/HDL Ratio 5.4 (H) <5.0 (calc)   Non-HDL Cholesterol (Calc) 177 (H) <130 mg/dL (calc)    Comment: For patients with diabetes plus 1 major ASCVD risk  factor, treating to a non-HDL-C goal of <100 mg/dL  (LDL-C of <70 mg/dL) is considered a therapeutic  option.   CBC     Status: Abnormal   Collection Time: 03/17/19  6:46 PM   Result Value Ref Range   WBC 10.8 (H) 4.0 - 10.5 K/uL   RBC 4.19 3.87 - 5.11 MIL/uL   Hemoglobin 11.4 (L) 12.0 - 15.0 g/dL   HCT 35.7 (L) 36.0 - 46.0 %   MCV 85.2 80.0 - 100.0 fL   MCH 27.2 26.0 - 34.0 pg   MCHC 31.9 30.0 - 36.0 g/dL   RDW 14.6 11.5 - 15.5 %   Platelets 481 (H) 150 - 400 K/uL   nRBC 0.0 0.0 - 0.2 %    Comment: Performed at [facility] [place] [id number] rate     Status: Abnormal   Collection Time: 03/17/19  6:46 PM  Result Value Ref Range   Sed Rate 79 (H) 0 -  20 mm/hr    Comment: Performed at [facility], [address] [place], [place] [address]  Antinuclear Antibodies, IFA     Status: None   Collection Time: 03/17/19  6:46 PM  Result Value Ref Range   ANA Ab, IFA Negative     Comment: (NOTE)                                     Negative   <1:80                                     Borderline  1:80                                     Positive   >1:80 Performed At: [facility] [place], [place] [id number] Rush Farmer MD [id number]   Renal function panel     Status: None   Collection Time: 03/17/19  6:46 PM  Result Value Ref Range   Sodium 137 135 - 145 mmol/L   Potassium 3.8 3.5 - 5.1 mmol/L   Chloride 102 98 - 111 mmol/L   CO2 25 22 - 32 mmol/L   Glucose, Bld 94 70 - 99 mg/dL   BUN 12 6 - 20 mg/dL   Creatinine, Ser 0.56 0.44 - 1.00 mg/dL   Calcium 8.9 8.9 - 10.3 mg/dL   Phosphorus 4.1 2.5 - 4.6 mg/dL   Albumin 4.2 3.5 - 5.0 g/dL   GFR calc non Af Amer >60 >60 mL/min   GFR calc Af Amer >60 >60 mL/min   Anion gap 10 5 - 15    Comment: Performed at [facility], [address] [place], [place] [address]  CYCLIC CITRUL PEPTIDE ANTIBODY, IGG/IGA     Status: None   Collection Time: 03/17/19  6:46 PM  Result Value Ref Range   CCP Antibodies IgG/IgA 3 0 - 19 units    Comment: (NOTE)                          Negative               <20                           Weak positive      20 - 39                          Moderate positive  40 - 59                          Strong positive        >59 Performed At: [facility] [place], [place] [id number] Rush Farmer MD [id number]   RPR     Status: None   Collection Time: 03/17/19  6:46 PM  Result Value Ref Range   RPR Ser Ql Non Reactive Non Reactive    Comment: (NOTE) Performed At: [facility] [address] [place], [place] [id number] Rush Farmer MD [id number]   C-reactive protein     Status: None   Collection Time: 03/17/19  10:22 PM  Result Value Ref Range   CRP <0.8 <1.0 mg/dL    Comment: Performed at [facility], [place] [address], [place], [place] [address]  Mpo/pr-3 (anca) antibodies     Status: None   Collection Time: 03/17/19 11:40 PM  Result Value Ref Range   Myeloperoxidase Abs <9.0 0.0 - 9.0 U/mL   ANCA Proteinase 3 <3.5 0.0 - 3.5 U/mL    Comment: (NOTE) Performed At: [facility] [place], [place] [id number] Rush Farmer MD [id number]    PHQ2/9: Depression screen [facility] 2/9 04/04/2019 01/31/2019  Decreased Interest 0 0  Down, Depressed, Hopeless 0 0  PHQ - 2 Score 0 0  Altered sleeping 0 3  Tired, decreased energy 0 3  Change in appetite 0 1  Feeling bad or failure about yourself  0 0  Trouble concentrating 0 0  Moving slowly or fidgety/restless 0 0  Suicidal thoughts 0 0  PHQ-9 Score 0 7  Difficult doing work/chores Not difficult at all Not difficult at all    Fall Risk: Fall Risk  04/04/2019 01/31/2019  Falls in the past year? 0 0  Number falls in past yr: 0 0  Injury with Fall? 0 0  Follow up - Falls evaluation completed   Functional Status Survey: Is the patient deaf or have difficulty hearing?: No Does the patient have difficulty seeing, even when wearing glasses/contacts?: No Does the patient have difficulty concentrating, remembering, or making decisions?: No Does the patient have  difficulty walking or climbing stairs?: No Does the patient have difficulty dressing or bathing?: No Does the patient have difficulty doing errands alone such as visiting a doctor's office or shopping?: No   Assessment & Plan  1. Well woman exam with routine gynecological exam - Cytology - PAP - Cervicovaginal ancillary only  2. Mixed hyperlipidemia - Discussed statin therapy, will hold off for now, consider after 6 month lipid recheck.  -USPSTF grade A and B recommendations reviewed with patient; age-appropriate recommendations, preventive care, screening tests, etc discussed and encouraged; healthy living encouraged; see AVS for patient education given to patient -Discussed importance of 150 minutes of physical activity weekly, eat two servings of fish weekly, eat one serving of tree nuts ( cashews, pistachios, pecans, almonds.Marland Kitchen) every other day, eat 6 servings of fruit/vegetables daily and drink plenty of water and avoid sweet beverages.

## 2019-04-04 NOTE — Patient Instructions (Signed)
Preventive Care 21-37 Years Old, Female Preventive care refers to visits with your health care provider and lifestyle choices that can promote health and wellness. This includes:  A yearly physical exam. This may also be called an annual well check.  Regular dental visits and eye exams.  Immunizations.  Screening for certain conditions.  Healthy lifestyle choices, such as eating a healthy diet, getting regular exercise, not using drugs or products that contain nicotine and tobacco, and limiting alcohol use. What can I expect for my preventive care visit? Physical exam Your health care provider will check your:  Height and weight. This may be used to calculate body mass index (BMI), which tells if you are at a healthy weight.  Heart rate and blood pressure.  Skin for abnormal spots. Counseling Your health care provider may ask you questions about your:  Alcohol, tobacco, and drug use.  Emotional well-being.  Home and relationship well-being.  Sexual activity.  Eating habits.  Work and work environment.  Method of birth control.  Menstrual cycle.  Pregnancy history. What immunizations do I need?  Influenza (flu) vaccine  This is recommended every year. Tetanus, diphtheria, and pertussis (Tdap) vaccine  You may need a Td booster every 10 years. Varicella (chickenpox) vaccine  You may need this if you have not been vaccinated. Human papillomavirus (HPV) vaccine  If recommended by your health care provider, you may need three doses over 6 months. Measles, mumps, and rubella (MMR) vaccine  You may need at least one dose of MMR. You may also need a second dose. Meningococcal conjugate (MenACWY) vaccine  One dose is recommended if you are age 19-21 years and a first-year college student living in a residence hall, or if you have one of several medical conditions. You may also need additional booster doses. Pneumococcal conjugate (PCV13) vaccine  You may need  this if you have certain conditions and were not previously vaccinated. Pneumococcal polysaccharide (PPSV23) vaccine  You may need one or two doses if you smoke cigarettes or if you have certain conditions. Hepatitis A vaccine  You may need this if you have certain conditions or if you travel or work in places where you may be exposed to hepatitis A. Hepatitis B vaccine  You may need this if you have certain conditions or if you travel or work in places where you may be exposed to hepatitis B. Haemophilus influenzae type b (Hib) vaccine  You may need this if you have certain conditions. You may receive vaccines as individual doses or as more than one vaccine together in one shot (combination vaccines). Talk with your health care provider about the risks and benefits of combination vaccines. What tests do I need?  Blood tests  Lipid and cholesterol levels. These may be checked every 5 years starting at age 20.  Hepatitis C test.  Hepatitis B test. Screening  Diabetes screening. This is done by checking your blood sugar (glucose) after you have not eaten for a while (fasting).  Sexually transmitted disease (STD) testing.  BRCA-related cancer screening. This may be done if you have a family history of breast, ovarian, tubal, or peritoneal cancers.  Pelvic exam and Pap test. This may be done every 3 years starting at age 21. Starting at age 30, this may be done every 5 years if you have a Pap test in combination with an HPV test. Talk with your health care provider about your test results, treatment options, and if necessary, the need for more tests.   Follow these instructions at home: Eating and drinking   Eat a diet that includes fresh fruits and vegetables, whole grains, lean protein, and low-fat dairy.  Take vitamin and mineral supplements as recommended by your health care provider.  Do not drink alcohol if: ? Your health care provider tells you not to drink. ? You are  pregnant, may be pregnant, or are planning to become pregnant.  If you drink alcohol: ? Limit how much you have to 0-1 drink a day. ? Be aware of how much alcohol is in your drink. In the [place] one drink equals one 12 oz bottle of beer (355 mL), one 5 oz glass of wine (148 mL), or one 1 oz glass of hard liquor (44 mL). Lifestyle  Take daily care of your teeth and gums.  Stay active. Exercise for at least 30 minutes on 5 or more days each week.  Do not use any products that contain nicotine or tobacco, such as cigarettes, e-cigarettes, and chewing tobacco. If you need help quitting, ask your health care provider.  If you are sexually active, practice safe sex. Use a condom or other form of birth control (contraception) in order to prevent pregnancy and STIs (sexually transmitted infections). If you plan to become pregnant, see your health care provider for a preconception visit. What's next?  Visit your health care provider once a year for a well check visit.  Ask your health care provider how often you should have your eyes and teeth checked.  Stay up to date on all vaccines. This information is not intended to replace advice given to you by your health care provider. Make sure you discuss any questions you have with your health care provider. Document Released: 10/17/2001 Document Revised: 05/02/2018 Document Reviewed: 05/02/2018 Elsevier Patient Education  2020 [employer].

## 2019-04-05 LAB — CERVICOVAGINAL ANCILLARY ONLY
Chlamydia: NEGATIVE
Neisseria Gonorrhea: NEGATIVE

## 2019-04-07 LAB — CYTOLOGY - PAP
Diagnosis: NEGATIVE
HPV: NOT DETECTED

## 2019-04-17 ENCOUNTER — Ambulatory Visit (INDEPENDENT_AMBULATORY_CARE_PROVIDER_SITE_OTHER): Payer: Medicaid Other | Admitting: Family Medicine

## 2019-04-17 ENCOUNTER — Other Ambulatory Visit: Payer: Self-pay

## 2019-04-17 ENCOUNTER — Encounter: Payer: Self-pay | Admitting: Family Medicine

## 2019-04-17 VITALS — BP 122/84 | HR 86 | Temp 96.8°F | Resp 14 | Ht 70.0 in | Wt 364.5 lb

## 2019-04-17 DIAGNOSIS — K219 Gastro-esophageal reflux disease without esophagitis: Secondary | ICD-10-CM

## 2019-04-17 DIAGNOSIS — Z3201 Encounter for pregnancy test, result positive: Secondary | ICD-10-CM | POA: Diagnosis not present

## 2019-04-17 DIAGNOSIS — N926 Irregular menstruation, unspecified: Secondary | ICD-10-CM | POA: Diagnosis not present

## 2019-04-17 LAB — POCT URINE PREGNANCY: Preg Test, Ur: POSITIVE — AB

## 2019-04-17 MED ORDER — PANTOPRAZOLE SODIUM 40 MG PO TBEC: 40.0000 mg | DELAYED_RELEASE_TABLET | Freq: Every day | ORAL | 3 refills | Status: DC

## 2019-04-17 NOTE — Progress Notes (Signed)
Patient ID: Ashley Herring, female    DOB: 1981/08/25, 38 y.o.   MRN: [id number]  PCP: Hubbard Hartshorn, FNP  Chief Complaint  Patient presents with  . Possible Pregnancy    positive at home tests/  needs confirmation, has ob appt next thursday    Subjective:   Ashley Herring is a 38 y.o. female, presents to clinic with CC of the following: Missed period, has been trying to conceive. LMP was around 7/6?  7/5 was last intercourse, only one other time having sex in the past ~2 months due to other illness.   OB hx: Two NVD, denies gestational DM or PIH Youngest child is 58 y/o, she has not had obgyn care here is relatively new to the area.  Her 2 miscarraiges were both prior to her term pregnancies - "has two rainbow babies" No nausea/vomiting, pelvic cramping, vaginal bleeding or spotting.  She has had + breast tenderness and worsening acid reflux- currently severe and almost daily, not taking anything.  OBGYN Dr. Rosana Hoes going to OBGYN next week [facility] - [id number] Last PAP 04/04/2019, well woman done at this same office with PCP Copied and reviewed from recent visit, also confirmed and reviewed same with pt today: "Hep C Screening: Had recent screening with [facility] and was negative July 2020 STD testing and prevention (HIV/chl/gon/syphilis): Had recent HIV and RPR which were negative.  We will check gon/chlam today with pap. Intimate partner violence: No concerns Sexual History/Pain during Intercourse: She is sexually active; no pain with intercourse Menstrual History/LMP/Abnormal Bleeding: No concerns; still having regular periods" POC Urine preg was positive here - discussed with pt results during visit.   Patient Active Problem List   Diagnosis Date Noted  . Hyperlipidemia 04/04/2019  . Elevated erythrocyte sedimentation rate 03/25/2019  . Scleritis of left eye 03/25/2019  . 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 Breast lump 01/31/2019  . DDD (degenerative disc disease), lumbar  01/31/2019  . Chronic left-sided low back pain with left-sided sciatica 01/31/2019  . Extreme obesity 01/31/2019    Current Outpatient Medications:  .  prednisoLONE acetate (PRED FORTE) 1 % ophthalmic suspension, Place 1 to 2 drops in the left eye 6 times daily for the next 10 days, Disp: 5 mL, Rfl: 0 .  cyclobenzaprine (FLEXERIL) 10 MG tablet, Take 1 tablet (10 mg total) by mouth at bedtime. (Patient not taking: Reported on 04/17/2019), Disp: 30 tablet, Rfl: 1 .  cyclopentolate (CYCLODRYL) 2 % ophthalmic solution, USE 1 DROP(S) IN LEFT EYE 2 TIMES A DAY FOR 14 DAYS, Disp: , Rfl:  .  erythromycin ophthalmic ointment, APPLY A SMALL AMOUNT IN LEFT EYE ONCE IN THE EVENING, Disp: , Rfl:   Allergies  Allergen Reactions  . Peanuts [Peanut Oil] Swelling   Family History  Problem Relation Age of Onset  . Hypertension Mother   . Diabetes Mother   . Hypertension Father   . Diabetes Father   . Asthma Daughter   . Breast cancer Paternal Aunt 63       Deceased from metastasis  . Cancer Maternal Grandmother   . Brain cancer Maternal Grandmother   . Brain cancer Paternal Grandmother   . Colon cancer Paternal Grandfather    Social History   Socioeconomic History  . Marital status: Married    Spouse name: Cornelia Copa  . Number of children: 2  . Years of education: Not on file  . Highest education level: Not on file  Occupational History  . Not on  file  Social Needs  . Financial resource strain: Not hard at all  . Food insecurity    Worry: Never true    Inability: Never true  . Transportation needs    Medical: No    Non-medical: No  Tobacco Use  . Smoking status: Former Smoker    Quit date: 01/31/2016    Years since quitting: 3.2  . Smokeless tobacco: Never Used  Substance and Sexual Activity  . Alcohol use: No  . Drug use: Never  . Sexual activity: Yes    Partners: Male  Lifestyle  . Physical activity    Days per week: 7 days    Minutes per session: 40 min  . Stress: To some  extent  Relationships  . Social connections    Talks on phone: More than three times a week    Gets together: More than three times a week    Attends religious service: 1 to 4 times per year    Active member of club or organization: Not on file    Attends meetings of clubs or organizations: 1 to 4 times per year    Relationship status: Married  . Intimate partner violence    Fear of current or ex partner: No    Emotionally abused: No    Physically abused: No    Forced sexual activity: No  Other Topics Concern  . Not on file  Social History Narrative   Have 2 children from previous relationships now married and they are trying to get pregnant   Updated OBGYN hx as able today - [facility] and not able to correctly put in order of gestations/live births/spontaneous abortion etc.   Obstetric Comments  Two NVD, denies gestational DM or PIH    I personally reviewed active problem list, medication list, allergies, family history, social history, notes from last encounter, lab results with the patient/caregiver today.  Review of Systems  Constitutional: Negative.   HENT: Negative.   Eyes: Negative.   Respiratory: Negative.   Cardiovascular: Negative.   Gastrointestinal: Negative.  Negative for abdominal pain, nausea and vomiting.  Endocrine: Negative.   Genitourinary: Negative.   Musculoskeletal: Positive for back pain (chronic).  Skin: Negative.   Allergic/Immunologic: Negative.   Neurological: Negative.   Hematological: Negative.   Psychiatric/Behavioral: Negative.   All other systems reviewed and are negative.      Objective:   Vitals:   04/17/19 1018  BP: 122/84  Pulse: 86  Resp: 14  Temp: (!) 96.8 F (36 C)  SpO2: 99%  Weight: (!) 364 lb 8 oz (165.3 kg)  Height: 5\' 10"  (1.778 m)    Body mass index is 52.3 kg/m.  Physical Exam Vitals signs and nursing note reviewed.  Constitutional:      General: She is not in acute distress.    Appearance: She  is well-developed. She is obese. She is not ill-appearing, toxic-appearing or diaphoretic.     Interventions: Face mask in place.  HENT:     Head: Normocephalic and atraumatic.     Right Ear: External ear normal.     Left Ear: External ear normal.  Eyes:     General:        Right eye: No discharge.        Left eye: No discharge.     Conjunctiva/sclera: Conjunctivae normal.  Neck:     Trachea: No tracheal deviation.  Cardiovascular:     Rate and Rhythm: Normal rate and regular rhythm.  Pulses: Normal pulses.     Heart sounds: Normal heart sounds. No murmur. No friction rub. No gallop.   Pulmonary:     Effort: Pulmonary effort is normal. No respiratory distress.     Breath sounds: Normal breath sounds. No stridor. No wheezing, rhonchi or rales.  Abdominal:     General: Bowel sounds are normal. There is no distension.     Palpations: Abdomen is soft.     Tenderness: There is no abdominal tenderness. There is no right CVA tenderness or left CVA tenderness.     Comments: Obese abdomen, exam limited secondary to body habitus  Musculoskeletal: Normal range of motion.  Skin:    General: Skin is warm and dry.     Coloration: Skin is not pale.     Findings: No rash.  Neurological:     Mental Status: She is alert.     Motor: No abnormal muscle tone.     Coordination: Coordination normal.  Psychiatric:        Mood and Affect: Mood normal.        Behavior: Behavior normal.      Depression screen [facility] 04/17/2019 04/04/2019 01/31/2019  Decreased Interest 0 0 0  Down, Depressed, Hopeless 0 0 0  PHQ - 2 Score 0 0 0  Altered sleeping 0 0 3  Tired, decreased energy 0 0 3  Change in appetite 0 0 1  Feeling bad or failure about yourself  0 0 0  Trouble concentrating 0 0 0  Moving slowly or fidgety/restless 0 0 0  Suicidal thoughts 0 0 0  PHQ-9 Score 0 0 7  Difficult doing work/chores Not difficult at all Not difficult at all Not difficult at all   PHQ 9 negative, reviewed today   Results for orders placed or performed in visit on 04/17/19  POCT urine pregnancy  Result Value Ref Range   Preg Test, Ur Positive (A) Negative        Assessment & Plan:   1. Positive pregnancy test LMP 03/10/2019 - EDD 12/15/2019 (may be farther along based off recent sexual activity?)  Estimated GA today [id number] per LMP With BMI and age - pt would be high risk pregnancy.  She is G5 she has OBGYN appt scheduled (Dr. Earlene Plateravis), discussed prenatal vitamins, advised to get and start one daily, further counseled regarding overall nutrition/exercise for healthy pregnancy.   She is on flexeril for back pain/herniated discs(?) reviewed pregnancy categories - B, advised to take as little as possible, but may still take if having severe sx or pain/spasms/tightness prevents good sleep.   No NSAIDS, ok for tylenol.  2. Gastroesophageal reflux disease, esophagitis presence not specified Worsening secondary to + preg, tx with prn H2 blockers, and can start Rx protonix daily if needed, reviewed all pregnancy safety categories with pt  3. Missed period - POCT urine pregnancy, LMP 03/10/2019 Urine pregnancy positive here in office  Follow up arranged already with OB GYN - Dr. Earlene Plateravis  Greater than 50% of this visit was spent in direct face-to-face counseling, obtaining history and physical, discussing and educating pt on treatment plan.  Total time of this visit was 30 min.  Remainder of time involved but was not limited to reviewing chart (recent and pertinent OV notes and labs), documentation in EMR, and coordinating care and treatment plan.   Danelle BerryLeisa Stasia Somero, PA-C 04/17/19 10:33 AM

## 2019-04-17 NOTE — Patient Instructions (Addendum)
Follow up with OBGYN  Please call us or sent me or Raquel Sarna a message if you feel like you need to change OBGYNs for any reason.  Try pepcid or zantac for acid reflux and can try protonix prescription in the morning on an empty stomach first thing in the morning to prevent acid reflux.

## 2019-04-24 ENCOUNTER — Ambulatory Visit (INDEPENDENT_AMBULATORY_CARE_PROVIDER_SITE_OTHER): Payer: Medicaid Other | Admitting: Advanced Practice Midwife

## 2019-04-24 ENCOUNTER — Other Ambulatory Visit: Payer: Self-pay

## 2019-04-24 ENCOUNTER — Encounter: Payer: Self-pay | Admitting: Advanced Practice Midwife

## 2019-04-24 ENCOUNTER — Other Ambulatory Visit (HOSPITAL_COMMUNITY)
Admission: RE | Admit: 2019-04-24 | Discharge: 2019-04-24 | Disposition: A | Discharge status: Home / Self Care | Payer: Medicaid Other | Source: Ambulatory Visit | Attending: Advanced Practice Midwife | Admitting: Advanced Practice Midwife

## 2019-04-24 VITALS — BP 132/86 | Wt 370.0 lb

## 2019-04-24 DIAGNOSIS — O99211 Obesity complicating pregnancy, first trimester: Secondary | ICD-10-CM

## 2019-04-24 DIAGNOSIS — O0990 Supervision of high risk pregnancy, unspecified, unspecified trimester: Secondary | ICD-10-CM | POA: Insufficient documentation

## 2019-04-24 DIAGNOSIS — Z113 Encounter for screening for infections with a predominantly sexual mode of transmission: Secondary | ICD-10-CM

## 2019-04-24 DIAGNOSIS — O99210 Obesity complicating pregnancy, unspecified trimester: Secondary | ICD-10-CM | POA: Insufficient documentation

## 2019-04-24 DIAGNOSIS — Z3A01 Less than 8 weeks gestation of pregnancy: Secondary | ICD-10-CM | POA: Diagnosis not present

## 2019-04-24 DIAGNOSIS — Z6841 Body Mass Index (BMI) 40.0 and over, adult: Secondary | ICD-10-CM | POA: Insufficient documentation

## 2019-04-24 NOTE — Progress Notes (Signed)
NOB today. LMP  03/09/2019

## 2019-04-24 NOTE — Patient Instructions (Signed)
Exercise During Pregnancy Exercise is an important part of being healthy for people of all ages. Exercise improves the function of your heart and lungs and helps you maintain strength, flexibility, and a healthy body weight. Exercise also boosts energy levels and elevates mood. Most women should exercise regularly during pregnancy. In rare cases, women with certain medical conditions or complications may be asked to limit or avoid exercise during pregnancy. How does this affect me? Along with maintaining general strength and flexibility, exercising during pregnancy can help:  Keep strength in muscles that are used during labor and childbirth.  Decrease low back pain.  Reduce symptoms of depression.  Control weight gain during pregnancy.  Reduce the risk of needing insulin if you develop diabetes during pregnancy.  Decrease the risk of cesarean delivery.  Speed up your recovery after giving birth. How does this affect my baby? Exercise can help you have a healthy pregnancy. Exercise does not cause premature birth. It will not cause your baby to weigh less at birth. What exercises can I do? Many exercises are safe for you to do during pregnancy. Do a variety of exercises that safely increase your heart and breathing rates and help you build and maintain muscle strength. Do exercises exactly as told by your health care provider. You may do these exercises:  Walking or hiking.  Swimming.  Water aerobics.  Riding a stationary bike.  Strength training.  Modified yoga or Pilates. Tell your instructor that you are pregnant. Avoid overstretching, and avoid lying on your back for long periods of time.  Running or jogging. Only choose this type of exercise if you: ? Ran or jogged regularly before your pregnancy. ? Can run or jog and still talk in complete sentences. What exercises should I avoid? Depending on your level of fitness and whether you exercised regularly before your  pregnancy, you may be told to limit high-intensity exercise. You can tell that you are exercising at a high intensity if you are breathing much harder and faster and cannot hold a conversation while exercising. You must avoid:  Contact sports.  Activities that put you at risk for falling on or being hit in the belly, such as downhill skiing, water skiing, surfing, rock climbing, cycling, gymnastics, and horseback riding.  Scuba diving.  Skydiving.  Yoga or Pilates in a room that is heated to high temperatures.  Jogging or running, unless you ran or jogged regularly before your pregnancy. While jogging or running, you should always be able to talk in full sentences. Do not run or jog so fast that you are unable to have a conversation.  Do not exercise at more than 6,000 feet above sea level (high elevation) if you are not used to exercising at high elevation. How do I exercise in a safe way?   Avoid overheating. Do not exercise in very high temperatures.  Wear loose-fitting, breathable clothes.  Avoid dehydration. Drink enough water before, during, and after exercise to keep your urine pale yellow.  Avoid overstretching. Because of hormone changes during pregnancy, it is easy to overstretch muscles, tendons, and ligaments during pregnancy.  Start slowly and ask your health care provider to recommend the types of exercise that are safe for you.  Do not exercise to lose weight. Follow these instructions at home:  Exercise on most days or all days of the week. Try to exercise for 30 minutes a day, 5 days a week, unless your health care provider tells you not to.  If   you actively exercised before your pregnancy and you are healthy, your health care provider may tell you to continue to do moderate to high-intensity exercise.  If you are just starting to exercise or did not exercise much before your pregnancy, your health care provider may tell you to do low to moderate-intensity  exercise. Questions to ask your health care provider  Is exercise safe for me?  What are signs that I should stop exercising?  Does my health condition mean that I should not exercise during pregnancy?  When should I avoid exercising during pregnancy? Stop exercising and contact a health care provider if: You have any unusual symptoms, such as:  Mild contractions of the uterus or cramps in the abdomen.  Dizziness that does not go away when you rest. Stop exercising and get help right away if: You have any unusual symptoms, such as:  Sudden, severe pain in your low back or your belly.  Mild contractions of the uterus or cramps in the abdomen that do not improve with rest and drinking fluids.  Chest pain.  Bleeding or fluid leaking from your vagina.  Shortness of breath. These symptoms may represent a serious problem that is an emergency. Do not wait to see if the symptoms will go away. Get medical help right away. Call your local emergency services (911 in the [place] Do not drive yourself to the hospital. Summary  Most women should exercise regularly throughout pregnancy. In rare cases, women with certain medical conditions or complications may be asked to limit or avoid exercise during pregnancy.  Do not exercise to lose weight during pregnancy.  Your health care provider will tell you what level of physical activity is right for you.  Stop exercising and contact a health care provider if you have mild contractions of the uterus or cramps in the abdomen. Get help right away if these contractions or cramps do not improve with rest and drinking fluids.  Stop exercising and get help right away if you have sudden, severe pain in your low back or belly, chest pain, shortness of breath, or bleeding or leaking of fluid from your vagina. This information is not intended to replace advice given to you by your health care provider. Make sure you discuss any questions you have with your  health care provider. Document Released: 08/21/2005 Document Revised: 12/12/2018 Document Reviewed: 09/25/2018 Elsevier Patient Education  2020 [employer] Eating Plan for Pregnant Women While you are pregnant, your body requires additional nutrition to help support your growing baby. You also have a higher need for some vitamins and minerals, such as folic acid, calcium, iron, and vitamin D. Eating a healthy, well-balanced diet is very important for your health and your baby's health. Your need for extra calories varies for the three 3-month segments of your pregnancy (trimesters). For most women, it is recommended to consume:  150 extra calories a day during the first trimester.  300 extra calories a day during the second trimester.  300 extra calories a day during the third trimester. What are tips for following this plan?   Do not try to lose weight or go on a diet during pregnancy.  Limit your overall intake of foods that have "empty calories." These are foods that have little nutritional value, such as sweets, desserts, candies, and sugar-sweetened beverages.  Eat a variety of foods (especially fruits and vegetables) to get a full range of vitamins and minerals.  Take a prenatal vitamin to help meet your additional vitamin   and mineral needs during pregnancy, specifically for folic acid, iron, calcium, and vitamin D.  Remember to stay active. Ask your health care provider what types of exercise and activities are safe for you.  Practice good food safety and cleanliness. Wash your hands before you eat and after you prepare raw meat. Wash all fruits and vegetables well before peeling or eating. Taking these actions can help to prevent food-borne illnesses that can be very dangerous to your baby, such as listeriosis. Ask your health care provider for more information about listeriosis. What does 150 extra calories look like? Healthy options that provide 150 extra calories each day  could be any of the following:  6-8 oz (170-230 g) of plain low-fat yogurt with  cup of berries.  1 apple with 2 teaspoons (11 g) of peanut butter.  Cut-up vegetables with  cup (60 g) of hummus.  8 oz (230 mL) or 1 cup of low-fat chocolate milk.  1 stick of string cheese with 1 medium orange.  1 peanut butter and jelly sandwich that is made with one slice of whole-wheat bread and 1 tsp (5 g) of peanut butter. For 300 extra calories, you could eat two of those healthy options each day. What is a healthy amount of weight to gain? The right amount of weight gain for you is based on your BMI before you became pregnant. If your BMI:  Was less than 18 (underweight), you should gain 28-40 lb (13-18 kg).  Was 18-24.9 (normal), you should gain 25-35 lb (11-16 kg).  Was 25-29.9 (overweight), you should gain 15-25 lb (7-11 kg).  Was 30 or greater (obese), you should gain 11-20 lb (5-9 kg). What if I am having twins or multiples? Generally, if you are carrying twins or multiples:  You may need to eat 300-600 extra calories a day.  The recommended range for total weight gain is 25-54 lb (11-25 kg), depending on your BMI before pregnancy.  Talk with your health care provider to find out about nutritional needs, weight gain, and exercise that is right for you. What foods can I eat?  Grains All grains. Choose whole grains, such as whole-wheat bread, oatmeal, or brown rice. Vegetables All vegetables. Eat a variety of colors and types of vegetables. Remember to wash your vegetables well before peeling or eating. Fruits All fruits. Eat a variety of colors and types of fruit. Remember to wash your fruits well before peeling or eating. Meats and other protein foods Lean meats, including chicken, turkey, fish, and lean cuts of beef, veal, or pork. If you eat fish or seafood, choose options that are higher in omega-3 fatty acids and lower in mercury, such as salmon, herring, mussels, trout,  sardines, pollock, shrimp, crab, and lobster. Tofu. Tempeh. Beans. Eggs. Peanut butter and other nut butters. Make sure that all meats, poultry, and eggs are cooked to food-safe temperatures or "well-done." Two or more servings of fish are recommended each week in order to get the most benefits from omega-3 fatty acids that are found in seafood. Choose fish that are lower in mercury. You can find more information online:  www.fda.gov Dairy Pasteurized milk and milk alternatives (such as almond milk). Pasteurized yogurt and pasteurized cheese. Cottage cheese. Sour cream. Beverages Water. Juices that contain 100% fruit juice or vegetable juice. Caffeine-free teas and decaffeinated coffee. Drinks that contain caffeine are okay to drink, but it is better to avoid caffeine. Keep your total caffeine intake to less than 200 mg each day (which   is 12 oz or 355 mL of coffee, tea, or soda) or the limit as told by your health care provider. Fats and oils Fats and oils are okay to include in moderation. Sweets and desserts Sweets and desserts are okay to include in moderation. Seasoning and other foods All pasteurized condiments. The items listed above may not be a complete list of recommended foods and beverages. Contact your dietitian for more options. The items listed above may not be a complete list of foods and beverages [you/your child] can eat. Contact a dietitian for more information. What foods are not recommended? Vegetables Raw (unpasteurized) vegetable juices. Fruits Unpasteurized fruit juices. Meats and other protein foods Lunch meats, bologna, hot dogs, or other deli meats. (If you must eat those meats, reheat them until they are steaming hot.) Refrigerated pat, meat spreads from a meat counter, smoked seafood that is found in the refrigerated section of a store. Raw or undercooked meats, poultry, and eggs. Raw fish, such as sushi or sashimi. Fish that have high mercury content, such as  tilefish, shark, swordfish, and king mackerel. To learn more about mercury in fish, talk with your health care provider or look for online resources, such as:  www.fda.gov Dairy Raw (unpasteurized) milk and any foods that have raw milk in them. Soft cheeses, such as feta, queso blanco, queso fresco, Brie, Camembert cheeses, blue-veined cheeses, and Panela cheese (unless it is made with pasteurized milk, which must be stated on the label). Beverages Alcohol. Sugar-sweetened beverages, such as sodas, teas, or energy drinks. Seasoning and other foods Homemade fermented foods and drinks, such as pickles, sauerkraut, or kombucha drinks. (Store-bought pasteurized versions of these are okay.) Salads that are made in a store or deli, such as ham salad, chicken salad, egg salad, tuna salad, and seafood salad. The items listed above may not be a complete list of foods and beverages to avoid. Contact your dietitian for more information. The items listed above may not be a complete list of foods and beverages [you/your child] should avoid. Contact a dietitian for more information. Where to find more information To calculate the number of calories you need based on your height, weight, and activity level, you can use an online calculator such as:  www.choosemyplate.gov/MyPlatePlan To calculate how much weight you should gain during pregnancy, you can use an online pregnancy weight gain calculator such as:  www.choosemyplate.gov/pregnancy-weight-gain-calculator Summary  While you are pregnant, your body requires additional nutrition to help support your growing baby.  Eat a variety of foods, especially fruits and vegetables to get a full range of vitamins and minerals.  Practice good food safety and cleanliness. Wash your hands before you eat and after you prepare raw meat. Wash all fruits and vegetables well before peeling or eating. Taking these actions can help to prevent food-borne illnesses, such as  listeriosis, that can be very dangerous to your baby.  Do not eat raw meat or fish. Do not eat fish that have high mercury content, such as tilefish, shark, swordfish, and king mackerel. Do not eat unpasteurized (raw) dairy.  Take a prenatal vitamin to help meet your additional vitamin and mineral needs during pregnancy, specifically for folic acid, iron, calcium, and vitamin D. This information is not intended to replace advice given to you by your health care provider. Make sure you discuss any questions you have with your health care provider. Document Released: 06/05/2014 Document Revised: 12/12/2018 Document Reviewed: 05/18/2017 Elsevier Patient Education  2020 Else[employer] Inc. Prenatal Care Prenatal care   is health care during pregnancy. It helps you and your unborn baby (fetus) stay as healthy as possible. Prenatal care may be provided by a midwife, a family practice health care provider, or a childbirth and pregnancy specialist (obstetrician). How does this affect me? During pregnancy, you will be closely monitored for any new conditions that might develop. To lower your risk of pregnancy complications, you and your health care provider will talk about any underlying conditions you have. How does this affect my baby? Early and consistent prenatal care increases the chance that your baby will be healthy during pregnancy. Prenatal care lowers the risk that your baby will be:  Born early (prematurely).  Smaller than expected at birth (small for gestational age). What can I expect at the first prenatal care visit? Your first prenatal care visit will likely be the longest. You should schedule your first prenatal care visit as soon as you know that you are pregnant. Your first visit is a good time to talk about any questions or concerns you have about pregnancy. At your visit, you and your health care provider will talk about:  Your medical history, including: ? Any past pregnancies. ? Your  family's medical history. ? The baby's father's medical history. ? Any long-term (chronic) health conditions you have and how you manage them. ? Any surgeries or procedures you have had. ? Any current over-the-counter or prescription medicines, herbs, or supplements you are taking.  Other factors that could pose a risk to your baby, including:  Your home setting and your stress levels, including: ? Exposure to abuse or violence. ? Household financial strain. ? Mental health conditions you have.  Your daily health habits, including diet and exercise. Your health care provider will also:  Measure your weight, height, and blood pressure.  Do a physical exam, including a pelvic and breast exam.  Perform blood tests and urine tests to check for: ? Urinary tract infection. ? Sexually transmitted infections (STIs). ? Low iron levels in your blood (anemia). ? Blood type and certain proteins on red blood cells (Rh antibodies). ? Infections and immunity to viruses, such as hepatitis B and rubella. ? HIV (human immunodeficiency virus).  Do an ultrasound to confirm your baby's growth and development and to help predict your estimated due date (EDD). This ultrasound is done with a probe that is inserted into the vagina (transvaginal ultrasound).  Discuss your options for genetic screening.  Give you information about how to keep yourself and your baby healthy, including: ? Nutrition and taking vitamins. ? Physical activity. ? How to manage pregnancy symptoms such as nausea and vomiting (morning sickness). ? Infections and substances that may be harmful to your baby and how to avoid them. ? Food safety. ? Dental care. ? Working. ? Travel. ? Warning signs to watch for and when to call your health care provider. How often will I have prenatal care visits? After your first prenatal care visit, you will have regular visits throughout your pregnancy. The visit schedule is often as follows:   Up to week 28 of pregnancy: once every 4 weeks.  28-36 weeks: once every 2 weeks.  After 36 weeks: every week until delivery. Some women may have visits more or less often depending on any underlying health conditions and the health of the baby. Keep all follow-up and prenatal care visits as told by your health care provider. This is important. What happens during routine prenatal care visits? Your health care provider will:  Measure your   weight and blood pressure.  Check for fetal heart sounds.  Measure the height of your uterus in your abdomen (fundal height). This may be measured starting around week 20 of pregnancy.  Check the position of your baby inside your uterus.  Ask questions about your diet, sleeping patterns, and whether you can feel the baby move.  Review warning signs to watch for and signs of labor.  Ask about any pregnancy symptoms you are having and how you are dealing with them. Symptoms may include: ? Headaches. ? Nausea and vomiting. ? Vaginal discharge. ? Swelling. ? Fatigue. ? Constipation. ? Any discomfort, including back or pelvic pain. Make a list of questions to ask your health care provider at your routine visits. What tests might I have during prenatal care visits? You may have blood, urine, and imaging tests throughout your pregnancy, such as:  Urine tests to check for glucose, protein, or signs of infection.  Glucose tests to check for a form of diabetes that can develop during pregnancy (gestational diabetes mellitus). This is usually done around week 24 of pregnancy.  An ultrasound to check your baby's growth and development and to check for birth defects. This is usually done around week 20 of pregnancy.  A test to check for group B strep (GBS) infection. This is usually done around week 36 of pregnancy.  Genetic testing. This may include blood or imaging tests, such as an ultrasound. Some genetic tests are done during the first trimester  and some are done during the second trimester. What else can I expect during prenatal care visits? Your health care provider may recommend getting certain vaccines during pregnancy. These may include:  A yearly flu shot (annual influenza vaccine). This is especially important if you will be pregnant during flu season.  Tdap (tetanus, diphtheria, pertussis) vaccine. Getting this vaccine during pregnancy can protect your baby from whooping cough (pertussis) after birth. This vaccine may be recommended between weeks 27 and 36 of pregnancy. Later in your pregnancy, your health care provider may give you information about:  Childbirth and breastfeeding classes.  Choosing a health care provider for your baby.  Umbilical cord banking.  Breastfeeding.  Birth control after your baby is born.  The hospital labor and delivery unit and how to tour it.  Registering at the hospital before you go into labor. Where to find more information  Office on Women's Health: womenshealth.gov  American Pregnancy Association: americanpregnancy.org  March of Dimes: marchofdimes.org Summary  Prenatal care helps you and your baby stay as healthy as possible during pregnancy.  Your first prenatal care visit will most likely be the longest.  You will have visits and tests throughout your pregnancy to monitor your health and your baby's health.  Bring a list of questions to your visits to ask your health care provider.  Make sure to keep all follow-up and prenatal care visits with your health care provider. This information is not intended to replace advice given to you by your health care provider. Make sure you discuss any questions you have with your health care provider. Document Released: 08/24/2003 Document Revised: 12/11/2018 Document Reviewed: 08/20/2017 Elsevier Patient Education  2020 Elsevier Inc.    COVID-19 and Your Pregnancy FAQ  How can I prevent infection with COVID-19 during my  pregnancy? Social distancing is key. Please limit any interactions in public. Try and work from home if possible. Frequently wash your hands after touching possibly contaminated surfaces. Avoid touching your face.  Minimize trips to   the store. Consider online ordering when possible.   Should I wear a mask? YES. It is recommended by the CDC that all people wear a cloth mask or facial covering in public. You should wear a mask to your visits in the office. This will help reduce transmission as well as your risk or acquiring COVID-19. New studies are showing that even asymptomatic individuals can spread the virus from talking.   Where can I get a mask? [place] and the city of [place] are partnering to provide masks to community members. You can pick up a mask from several locations. This website also has instructions about how to make a mask by sewing or without sewing by using a t-shirt or bandana.  https://www.[place].gov/i-want-to/learn-about/covid-19-information-and-updates/covid-19-face-mask-project  Studies have shown that if you were a tube or nylon stocking from pantyhose over a cloth mask it makes the cloth mask almost as effective as a N95 mask.  https://www.npr.org/sections/goatsandsoda/2018/12/25/840146830/adding-a-nylon-stocking-layer-could-boost-protection-from-cloth-masks-study-find  What are the symptoms of COVID-19? Fever (greater than 100.4 F), dry cough, shortness of breath.  Am I more at risk for COVID-19 since I am pregnant? There is not currently data showing that pregnant women are more adversely impacted by COVID-19 than the general population. However, we know that pregnant women tend to have worse respiratory complications from similar diseases such as the flu and SARS and for this reason should be considered an at-risk population.  What do I do if I am experiencing the symptoms of COVID-19? Testing is being limited because of test availability. If you are  experiencing symptoms you should quarantine yourself, and the members of your family, for at least 2 weeks at home.   Please visit this website for more information: https://www.cdc.gov/coronavirus/2019-ncov/if-you-are-sick/steps-when-sick.html  When should I go to the Emergency Room? Please go to the emergency room if you are experiencing ANY of these symptoms*:  1.    Difficulty breathing or shortness of breath 2.    Persistent pain or pressure in the chest 3.    Confusion or difficulty being aroused (or awakened) 4.    Bluish lips or face  *This list is not all inclusive. Please consult our office for any other symptoms that are severe or concerning.  What do I do if I am having difficulty breathing? You should go to the Emergency Room for evaluation. At this time they have a tent set up for evaluating patients with COVID-19 symptoms.   How will my prenatal care be different because of the COVID-19 pandemic? It has been recommended to reduce the frequency of face-to-face visits and use resources such as telephone and virtual visits when possible. Using a scale, blood pressure machine and fetal doppler at home can further help reduce face-to-face visits. You will be provided with additional information on this topic.  We ask that you come to your visits alone to minimize potential exposures to  COVID-19.  How can I receive childbirth education? At this time in-person classes have been cancelled. You can register for online childbirth education, breastfeeding, and newborn care classes.  Please visit:  [email or web address] for more information  How will my hospital birth experience be different? The hospital is currently limiting visitors. This means that while you are in labor you can only have one person at the hospital with you. Additional family members will not be allowed to wait in the building or outside your room. Your one support person can be the father of the baby, a  relative, a doula, or a friend. Once one support person   is designated that person will wear a band. This band cannot be shared with multiple people.  Nitrous Gas is not being offered for pain relief since the tubing and filter for the machine can not be sanitized in a way to guarantee prevention of transmission of COVID-19.  Nasal cannula use of oxygen for fetal indications has also been discontinued.  Currently a clear plastic sheet is being hung between mom and the delivering provider during pushing and delivery to help prevent transmission of COVID-19.      How long will I stay in the hospital for after giving birth? It is also recommended that discharge home be expedited during the COVID-19 outbreak. This means staying for 1 day after a vaginal delivery and 2 days after a cesarean section. Patients who need to stay longer for medical reasons are allowed to do so, but the goal will be for expedited discharge home.   What if I have COVID-19 and I am in labor? We ask that you wear a mask while on labor and delivery. We will try and accommodate you being placed in a room that is capable of filtering the air. Please call ahead if you are in labor and on your way to the hospital. The phone number for labor and delivery at [facility] is [phone].  If I have COVID-19 when my baby is born how can I prevent my baby from contracting COVID-19? This is an issue that will have to be discussed on a case-by-case basis. Current recommendations suggest providing separate isolation rooms for both the mother and new infant as well as limiting visitors. However, there are practical challenges to this recommendation. The situation will assuredly change and decisions will be influenced by the desires of the mother and availability of space.  Some suggestions are the use of a curtain or physical barrier between mom and infant, hand hygiene, mom wearing a mask, or 6 feet of spacing between a  mom and infant.   Can I breastfeed during the COVID-19 pandemic?   Yes, breastfeeding is encouraged.  Can I breastfeed if I have COVID-19? Yes. Covid-19 has not been found in breast milk. This means you cannot give COVID-19 to your child through breast milk. Breast feeding will also help pass antibodies to fight infection to your baby.   What precautions should I take when breastfeeding if I have COVID-19? If a mother and newborn do room-in and the mother wishes to feed at the breast, she should put on a facemask and practice hand hygiene before each feeding.  What precautions should I take when pumping if I have COVID-19? Prior to expressing breast milk, mothers should practice hand hygiene. After each pumping session, all parts that come into contact with breast milk should be thoroughly washed and the entire pump should be appropriately disinfected per the manufacturer's instructions. This expressed breast milk should be fed to the newborn by a healthy caregiver.  What if I am pregnant and work in healthcare? Based on limited data regarding COVID-19 and pregnancy, ACOG currently does not propose creating additional restrictions on pregnant health care personnel because of COVID-19 alone. Pregnant women do not appear to be at higher risk of severe disease related to COVID-19. Pregnant health care personnel should follow CDC risk assessment and infection control guidelines for health care personnel exposed to patients with suspected or confirmed COVID-19. Adherence to recommended infection prevention and control practices is an important part of protecting all health care personnel in   health care settings.    Information on COVID-19 in pregnancy is very limited; however, facilities may want to consider limiting exposure of pregnant health care personnel to patients with confirmed or suspected COVID-19 infection, especially during higher-risk procedures (eg, aerosol-generating procedures), if  feasible, based on staffing availability.    

## 2019-04-26 LAB — URINE CULTURE

## 2019-04-26 LAB — CERVICOVAGINAL ANCILLARY ONLY
Chlamydia: NEGATIVE
Neisseria Gonorrhea: NEGATIVE
Trichomonas: NEGATIVE

## 2019-04-26 NOTE — Progress Notes (Signed)
New Obstetric Patient H&P  Date of Service: 04/24/2019  Chief Complaint: "Desires prenatal care"   History of Present Illness: Patient is a 38 y.o. [id number] Not Hispanic or Latino female, presents with amenorrhea and positive home pregnancy test. Patient's last menstrual period was 03/10/2019 (exact date). and based on her  LMP, her EDD is Estimated Date of Delivery: 12/15/19 and her EGA is 2938w5d. Cycles are 6. days, regular, and occur approximately every : 28 days. Her last pap smear was 3 weeks ago and was no abnormalities.    She had a urine pregnancy test which was positive 1 or 2 week(s)  ago. Her last menstrual period was shorter than usual and lasted for  3 day(s). Since her LMP she claims she has experienced breast tenderness, fatigue, nausea. She denies vaginal bleeding. Her past medical history is contributory for obesity with BMI greater than 50, chronic back pain/degenerative disc disease. Her prior pregnancies are notable for G1 SAB, G2 SAB, G3 FT SVD, G4 FT SVD. She denies hx GDM or PIH.  Since her LMP, she admits to the use of tobacco products  no She claims she has gained   6 pounds since the start of her pregnancy.  There are cats in the home in the home  no  She admits close contact with children on a regular basis  yes  She has had chicken pox in the past yes She has had Tuberculosis exposures, symptoms, or previously tested positive for TB   no Current or past history of domestic violence. no  Genetic Screening/Teratology Counseling: (Includes patient, baby's father, or anyone in either family with:)   1. [facility]  yes 2. Thalassemia ([place], [place], Mediterranean, or Asian background): MCV<80  no 3. Neural tube defect (meningomyelocele, spina bifida, anencephaly)  no 4. Congenital heart defect  no  5. Down syndrome  no 6. Tay-Sachs (Jewish, [place])  no 7. Canavan's Disease  no 8. Sickle cell disease or trait (African)  no  9. Hemophilia or  other blood disorders  no  10. Muscular dystrophy  no  11. Cystic fibrosis  no  12. Huntington's Chorea  no  13. Mental retardation/autism  no 14. Other inherited genetic or chromosomal disorder  no 15. Maternal metabolic disorder (DM, PKU, etc)  no 16. Patient or FOB with a child with a birth defect not listed above no  16a. Patient or FOB with a birth defect themselves no 17. Recurrent pregnancy loss, or stillbirth  no  18. Any medications since LMP other than prenatal vitamins (include vitamins, supplements, OTC meds, drugs, alcohol)  no 19. Any other genetic/environmental exposure to discuss  no  Infection History:   1. Lives with someone with TB or TB exposed  no  2. Patient or partner has history of genital herpes  no 3. Rash or viral illness since LMP  no 4. History of STI (GC, CT, HPV, syphilis, HIV)  no 5. History of recent travel :  no  Other pertinent information:  no     Review of Systems:10 point review of systems negative unless otherwise noted in HPI  Past Medical History:  Past Medical History:  Diagnosis Date  . Back pain   . Eye inflammation   . Miscarriage   . Sciatica     Past Surgical History:  Past Surgical History:  Procedure Laterality Date  . HEMORRHOID SURGERY      Gynecologic History: Patient's last menstrual period was 03/10/2019 (exact  date).  Obstetric History: [id number]  Family History:  Family History  Problem Relation Age of Onset  . Hypertension Mother   . Diabetes Mother   . Hypertension Father   . Diabetes Father   . Asthma Daughter   . Breast cancer Paternal Aunt 52       Deceased from metastasis  . Cancer Maternal Grandmother   . Brain cancer Maternal Grandmother   . Brain cancer Paternal Grandmother   . Colon cancer Paternal Grandfather     Social History:  Social History   Socioeconomic History  . Marital status: Married    Spouse name: Cornelia Copa  . Number of children: 2  . Years of education: Not on file  .  Highest education level: Not on file  Occupational History  . Not on file  Social Needs  . Financial resource strain: Not hard at all  . Food insecurity    Worry: Never true    Inability: Never true  . Transportation needs    Medical: No    Non-medical: No  Tobacco Use  . Smoking status: Former Smoker    Quit date: 01/31/2016    Years since quitting: 3.2  . Smokeless tobacco: Never Used  Substance and Sexual Activity  . Alcohol use: No  . Drug use: Never  . Sexual activity: Yes    Partners: Male    Birth control/protection: None  Lifestyle  . Physical activity    Days per week: 7 days    Minutes per session: 40 min  . Stress: To some extent  Relationships  . Social connections    Talks on phone: More than three times a week    Gets together: More than three times a week    Attends religious service: 1 to 4 times per year    Active member of club or organization: Not on file    Attends meetings of clubs or organizations: 1 to 4 times per year    Relationship status: Married  . Intimate partner violence    Fear of current or ex partner: No    Emotionally abused: No    Physically abused: No    Forced sexual activity: No  Other Topics Concern  . Not on file  Social History Narrative   Have 2 children from previous relationships now married and they are trying to conceive    + POC urine preg 04/17/2019    Allergies:  Allergies  Allergen Reactions  . Peanuts [Peanut Oil] Swelling    Medications: Prior to Admission medications   Not on File    Physical Exam Vitals: Blood pressure 132/86, weight (!) 370 lb (167.8 kg), last menstrual period 03/10/2019.  General: NAD HEENT: normocephalic, anicteric Thyroid: no enlargement, no palpable nodules Pulmonary: No increased work of breathing, CTAB Cardiovascular: RRR, distal pulses 2+ Abdomen: NABS, soft, non-tender, non-distended.  Umbilicus without lesions.  No hepatomegaly, splenomegaly or masses palpable. No evidence  of hernia  Genitourinary: Deferred for no concerns/recent PAP Extremities: no edema, erythema, or tenderness Neurologic: Grossly intact Psychiatric: mood appropriate, affect full   Assessment: 38 y.o. [id number] at [redacted]w[redacted]d presenting to initiate prenatal care  Plan: 1) Avoid alcoholic beverages. 2) Patient encouraged not to smoke.  3) Discontinue the use of all non-medicinal drugs and chemicals.  4) Take prenatal vitamins daily.  5) Nutrition, food safety (fish, cheese advisories, and high nitrite foods) and exercise discussed. 6) Hospital and practice style discussed with cross coverage system.  7) Genetic Screening, such as with  1st Trimester Screening, cell free fetal DNA, AFP testing, and Ultrasound, as well as with amniocentesis and CVS as appropriate, is discussed with patient. At the conclusion of today's visit patient requested genetic testing 8) Patient is asked about travel to areas at risk for the Zika virus, and counseled to avoid travel and exposure to mosquitoes or sexual partners who may have themselves been exposed to the virus. Testing is discussed, and will be ordered as appropriate.  9) Urine culture, aptima today 10) Future order early 1 hr gtt ordered 11) Return to office in 1 week for dating scan and rob 12) Labs at 12 weeks: NOB panel, sickle cell, early 1 hour, MaterniT 21, Inheritest   Ashley Herring, CNM [facility] 04/26/2019, 12:54 PM

## 2019-05-01 ENCOUNTER — Ambulatory Visit (INDEPENDENT_AMBULATORY_CARE_PROVIDER_SITE_OTHER): Payer: Self-pay | Admitting: Maternal Newborn

## 2019-05-01 ENCOUNTER — Other Ambulatory Visit: Payer: Self-pay

## 2019-05-01 ENCOUNTER — Encounter: Payer: Self-pay | Admitting: Maternal Newborn

## 2019-05-01 ENCOUNTER — Ambulatory Visit (INDEPENDENT_AMBULATORY_CARE_PROVIDER_SITE_OTHER): Payer: Medicaid Other

## 2019-05-01 VITALS — BP 130/70

## 2019-05-01 DIAGNOSIS — O0990 Supervision of high risk pregnancy, unspecified, unspecified trimester: Secondary | ICD-10-CM

## 2019-05-01 DIAGNOSIS — Z3A01 Less than 8 weeks gestation of pregnancy: Secondary | ICD-10-CM

## 2019-05-01 DIAGNOSIS — O99210 Obesity complicating pregnancy, unspecified trimester: Secondary | ICD-10-CM

## 2019-05-01 DIAGNOSIS — N8311 Corpus luteum cyst of right ovary: Secondary | ICD-10-CM | POA: Diagnosis not present

## 2019-05-01 DIAGNOSIS — Z369 Encounter for antenatal screening, unspecified: Secondary | ICD-10-CM

## 2019-05-01 DIAGNOSIS — O99211 Obesity complicating pregnancy, first trimester: Secondary | ICD-10-CM

## 2019-05-01 DIAGNOSIS — Z3689 Encounter for other specified antenatal screening: Secondary | ICD-10-CM

## 2019-05-01 DIAGNOSIS — O0991 Supervision of high risk pregnancy, unspecified, first trimester: Secondary | ICD-10-CM

## 2019-05-01 DIAGNOSIS — O3481 Maternal care for other abnormalities of pelvic organs, first trimester: Secondary | ICD-10-CM | POA: Diagnosis not present

## 2019-05-01 LAB — POCT URINALYSIS DIPSTICK OB: Glucose, UA: NEGATIVE

## 2019-05-01 NOTE — Progress Notes (Signed)
No problems.rj 

## 2019-05-01 NOTE — Progress Notes (Signed)
    Routine Prenatal Care Visit  Subjective  Ashley Herring is a 38 y.o. [id number] at [redacted]w[redacted]d being seen today for ongoing prenatal care.  She is currently monitored for the following issues for this high-risk pregnancy and has Breast lump; DDD (degenerative disc disease), lumbar; Chronic left-sided low back pain with left-sided sciatica; Extreme obesity; Elevated erythrocyte sedimentation rate; Scleritis of left eye; Spondylosis of lumbar joint; Hyperlipidemia; Supervision of high risk pregnancy, antepartum; Obesity affecting pregnancy, antepartum; and BMI 50.0-59.9, adult ([place]) on their problem list.  ----------------------------------------------------------------------------------- Patient reports no complaints.   Vag. Bleeding: None.   ----------------------------------------------------------------------------------- The following portions of the patient's history were reviewed and updated as appropriate: allergies, current medications, past family history, past medical history, past social history, past surgical history and problem list. Problem list updated.   Objective  Blood pressure 130/70, last menstrual period 03/10/2019. Pregravid weight 364 lb (165.1 kg) Total Weight Gain 6 lb (2.722 kg) Urinalysis: Urine dipstick shows negative for glucose, positive for protein (trace).  Fetal Status: Fetal Heart Rate (bpm): Present ([place])         General:  Alert, oriented and cooperative. Patient is in no acute distress.  Skin: Skin is warm and dry. No rash noted.   Cardiovascular: Normal heart rate noted  Respiratory: Normal respiratory effort, no problems with respiration noted  Abdomen: Soft, gravid, appropriate for gestational age. Pain/Pressure: Absent     Pelvic:  Cervical exam deferred        Extremities: Normal range of motion.     Mental Status: Normal mood and affect. Normal behavior. Normal judgment and thought content.     Assessment   38 y.o. [id number] at [redacted]w[redacted]d, EDD 12/15/2019 by  Last Menstrual Period presenting for a routine prenatal visit.  Plan   pregnancy Problems (from 04/24/19 to present)    No problems associated with this episode.      Ultrasound today showed a singleton IUP with a gestational age of 6w 1d, heartbeat present but size was too small to measure FHT. EDD changed to 12/24/2019 per ACOG Guidelines. Results reviewed with patient. Repeat ultrasound in two weeks.  GTT/labs not done today, patient waiting to see if she qualifies for pregnancy Medicaid. Plan to do labs with genetic screening.  Please refer to After Visit Summary for other counseling recommendations.   Return in about 2 weeks (around 05/15/2019) for ROB with follow up dating scan.  Avel Sensor, CNM 05/01/2019  3:52 PM

## 2019-05-15 ENCOUNTER — Other Ambulatory Visit: Payer: Self-pay

## 2019-05-15 ENCOUNTER — Ambulatory Visit (INDEPENDENT_AMBULATORY_CARE_PROVIDER_SITE_OTHER): Payer: Medicaid Other

## 2019-05-15 ENCOUNTER — Ambulatory Visit (INDEPENDENT_AMBULATORY_CARE_PROVIDER_SITE_OTHER): Payer: Medicaid Other | Admitting: Certified Nurse Midwife

## 2019-05-15 VITALS — BP 140/80 | Wt 366.8 lb

## 2019-05-15 DIAGNOSIS — Z3687 Encounter for antenatal screening for uncertain dates: Secondary | ICD-10-CM

## 2019-05-15 DIAGNOSIS — Z3A08 8 weeks gestation of pregnancy: Secondary | ICD-10-CM

## 2019-05-15 DIAGNOSIS — O99210 Obesity complicating pregnancy, unspecified trimester: Secondary | ICD-10-CM

## 2019-05-15 DIAGNOSIS — O0990 Supervision of high risk pregnancy, unspecified, unspecified trimester: Secondary | ICD-10-CM

## 2019-05-15 DIAGNOSIS — Z369 Encounter for antenatal screening, unspecified: Secondary | ICD-10-CM

## 2019-05-15 DIAGNOSIS — O99211 Obesity complicating pregnancy, first trimester: Secondary | ICD-10-CM

## 2019-05-15 DIAGNOSIS — Z6841 Body Mass Index (BMI) 40.0 and over, adult: Secondary | ICD-10-CM

## 2019-05-15 DIAGNOSIS — O0991 Supervision of high risk pregnancy, unspecified, first trimester: Secondary | ICD-10-CM

## 2019-05-15 LAB — POCT URINALYSIS DIPSTICK OB: Glucose, UA: NEGATIVE

## 2019-05-15 NOTE — Progress Notes (Signed)
HROB and  ultrasound today at  8wk1d: CRL 8wk4d with FHR 164. She desires NIPT. WIll not be getting Medicaid and will be self pay. Will get MaterniT 21, 1 hour GTT and NOB labs at next visit in 2-3 weeks. She complains of nausea and vomiting in the AM. Has lost 3-4 # in 3 weeks. Given samples of Bonjesta with instructions for use. Has also been having some acid reflux. Pepcid and Tums OK to use.  Will need anesthesia consult at 20-24 weeks  Dalia Heading, CNM

## 2019-05-15 NOTE — Progress Notes (Signed)
C/o nauseous c bending over; stomach acid; dates - do they need to change.rj

## 2019-05-21 ENCOUNTER — Telehealth: Payer: Self-pay

## 2019-05-21 NOTE — Telephone Encounter (Signed)
Patient states she keeps tasting blood after having morning sickness. Inquiring if this is normal if she needs to be seen. MB#[phone]

## 2019-05-21 NOTE — Telephone Encounter (Signed)
Spoke w/pt. Patient states she is vomiting 1x/day. She tried the [place] around 2 am Saturday but wasn't able to function at work the next day. She had a lot of n/v on Sunday. Advised to take Bonjesta and Prenatal Vitamin at bedtime and see how well that works for her. Also advised tasting blood may be due to dry throat from vomiting in a.m. prior to fluids being consumed. Monitor for vomiting pure blood. Advised on OTC meds (B6, Doxalamine, Ginger) to help with N/V if the [facility] still makes her too sleepy after taking at night. Patient will try these and let us know if she needs further assistance.

## 2019-05-28 ENCOUNTER — Ambulatory Visit: Payer: Medicaid Other | Attending: Oncology | Admitting: *Deleted

## 2019-05-28 ENCOUNTER — Other Ambulatory Visit: Payer: Self-pay | Admitting: Maternal Newborn

## 2019-05-28 ENCOUNTER — Telehealth: Payer: Self-pay | Admitting: *Deleted

## 2019-05-28 ENCOUNTER — Other Ambulatory Visit: Payer: Self-pay

## 2019-05-28 VITALS — BP 148/80 | HR 79 | Temp 98.4°F

## 2019-05-28 DIAGNOSIS — Z1379 Encounter for other screening for genetic and chromosomal anomalies: Secondary | ICD-10-CM

## 2019-05-28 DIAGNOSIS — O219 Vomiting of pregnancy, unspecified: Secondary | ICD-10-CM

## 2019-05-28 MED ORDER — DOXYLAMINE-PYRIDOXINE 10-10 MG PO TBEC: 2.0000 | DELAYED_RELEASE_TABLET | Freq: Every day | ORAL | 5 refills | Status: DC

## 2019-05-28 NOTE — Progress Notes (Signed)
  Subjective:     Patient ID: Ashley Herring, female   DOB: 1981-06-06, 38 y.o.   MRN: [id number]  HPI   Review of Systems     Objective:   Physical Exam Chest:     Breasts:        Right: No swelling, bleeding, inverted nipple, mass, nipple discharge, skin change or tenderness.        Left: No swelling, bleeding, inverted nipple, nipple discharge, skin change or tenderness.    Lymphadenopathy:     Upper Body:     Right upper body: No supraclavicular or axillary adenopathy.     Left upper body: No supraclavicular or axillary adenopathy.        Assessment:     38 year old African American female returns to [facility] for repeat clinical breast exam.  Last exam on 02/26/19 revealed a bb sized nodule at 6:00 in the left nipple.  This was only palpable sitting in semi-flowers position and squeezing the nipple.  The patient is now pregnant [redacted] weeks pregnant and states she feels like the area has gotten larger.  On clinical breast exam I can palpate a thickening at 6:00 in the left nipple only if I squeeze the nipple.  It does feel different that on earlier exam.  It is more of a thickening now vs a bb sized nodule before.  The patient has a family history of breast cancer in a paternal aunt with breast cancer at age 35.  Patients Tyrer-Cuzick risk assessment score was 15.4%, but met criteria with NCCN guideline recommendations for genetic testing.  Discussed this with patient and she would like to see someone in our high risk clinic.  I feel it may be prudent to see a surgeon for further evaluation of the nipple changes.  She is agreeable.    Plan:        Message sent to Jeanella Anton to schedule patient for surgical consult.  Referral sent to [facility] for appointment to our high risk clinic.  Will follow up as indicated per BCCCP protocol.

## 2019-05-28 NOTE — Progress Notes (Signed)
Rx for Diclegis. 

## 2019-05-30 ENCOUNTER — Telehealth: Payer: Self-pay | Admitting: Obstetrics & Gynecology

## 2019-05-30 ENCOUNTER — Other Ambulatory Visit: Payer: Medicaid Other

## 2019-05-30 ENCOUNTER — Other Ambulatory Visit: Payer: Self-pay

## 2019-05-30 ENCOUNTER — Encounter: Payer: Self-pay | Admitting: Obstetrics & Gynecology

## 2019-05-30 ENCOUNTER — Ambulatory Visit (INDEPENDENT_AMBULATORY_CARE_PROVIDER_SITE_OTHER): Payer: Medicaid Other | Admitting: Obstetrics & Gynecology

## 2019-05-30 VITALS — BP 120/80 | Wt 366.0 lb

## 2019-05-30 DIAGNOSIS — O99210 Obesity complicating pregnancy, unspecified trimester: Secondary | ICD-10-CM

## 2019-05-30 DIAGNOSIS — Z3A10 10 weeks gestation of pregnancy: Secondary | ICD-10-CM

## 2019-05-30 DIAGNOSIS — O09521 Supervision of elderly multigravida, first trimester: Secondary | ICD-10-CM

## 2019-05-30 DIAGNOSIS — O0991 Supervision of high risk pregnancy, unspecified, first trimester: Secondary | ICD-10-CM

## 2019-05-30 DIAGNOSIS — O99211 Obesity complicating pregnancy, first trimester: Secondary | ICD-10-CM

## 2019-05-30 DIAGNOSIS — O0990 Supervision of high risk pregnancy, unspecified, unspecified trimester: Secondary | ICD-10-CM

## 2019-05-30 DIAGNOSIS — Z6841 Body Mass Index (BMI) 40.0 and over, adult: Secondary | ICD-10-CM

## 2019-05-30 LAB — POCT URINALYSIS DIPSTICK OB: Glucose, UA: NEGATIVE

## 2019-05-30 NOTE — Telephone Encounter (Signed)
Attempted to call patient phone number on file isn't currently accessing calls at this time. willl send message thru My chart

## 2019-05-30 NOTE — Progress Notes (Signed)
  Subjective  No nausea or pain  Objective  BP 120/80   Wt (!) 366 lb (166 kg)   LMP 03/10/2019 (Exact Date)   BMI 52.52 kg/m  General: NAD Pumonary: no increased work of breathing Abdomen: gravid, non-tender Extremities: no edema Psychiatric: mood appropriate, affect full  Assessment  38 y.o. [id number] at [redacted]w[redacted]d by  12/24/2019, by Ultrasound presenting for routine prenatal visit  Plan   Problem List Items Addressed This Visit      Other   Supervision of high risk pregnancy, antepartum   Relevant Orders   RPR+Rh+ABO+Rub Ab+Ab Scr+CB...   Obesity affecting pregnancy, antepartum   Relevant Orders   Ambulatory referral to Anesthesiology    Other Visit Diagnoses    Multigravida of advanced maternal age in first trimester    -  Primary   Relevant Orders   MaterniT21 PLUS Core+SCA   [redacted] weeks gestation of pregnancy          pregnancy Problems (from 04/24/19 to present)    Problem Noted Resolved   Supervision of high risk pregnancy, antepartum 04/24/2019 by Rod Can, CNM No   Overview Addendum 04/26/2019 12:49 PM by Rod Can, [place] Prenatal Labs Today  Dating [place] Blood type:     Genetic Screen 1 Screen:    AFP:     Quad:     NIPS: Antibody:   Anatomic [place]  Rubella:   Varicella: @VZVIGG @  GTT Early:               Third trimester:  RPR: Non Reactive (07/13 1846)   Rhogam  HBsAg:     TDaP vaccine         Flu Shot: considering HIV:     Baby Food Considering breast                GBS:   Contraception unsure Pap: 2020 negative  CBB  no   CS/VBAC na   Support Person Husband Cornelia Copa          Obesity affecting pregnancy, antepartum 04/24/2019 by Rod Can, CNM No   Overview Signed 05/01/2019  3:14 PM by Rexene Agent, CNM    BMI >=40 [x ] early 1h gtt -  [x ] u/s for dating [ ]   [x ] nutritional goals [x ] folic acid 1mg  [ ]  bASA (>12 weeks) [ ]  consider nutrition consult [ ]  consider maternal EKG 1st trimester [ ]  Growth u/s 28 [ ] , 32  [ ] , 36 weeks [ ]  [ ]  NST/AFI weekly 36+ weeks (36[] , 37[] , 38[] , 39[] , 40[] ) [ ]  IOL by 41 weeks (scheduled, prn [] )        Labs today, NIPT as well  Barnett Applebaum, MD, [facility], [facility] 05/30/2019  2:47 PM

## 2019-05-30 NOTE — Patient Instructions (Signed)
Genetic Testing During Pregnancy Genetic testing during pregnancy is also called prenatal genetic testing. This type of testing can determine if your baby is at risk of being born with a disorder caused by abnormal genes or chromosomes (genetic disorder). Chromosomes contain genes that control how your baby will develop in your womb. There are many different genetic disorders. Examples of genetic disorders that may be found through genetic testing include Down syndrome and cystic fibrosis. Gene changes (mutations) can be passed down through families. Genetic testing is offered to all women before or during pregnancy. You can choose whether to have genetic testing. Why is genetic testing done? Genetic testing is done during pregnancy to find out whether your child is at risk for a genetic disorder. Having genetic testing allows you to:  Discuss your test results and options with a genetic counselor.  Prepare for a baby that may be born with a genetic disorder. Learning about the disorder ahead of time helps you be better prepared to manage it. Your health care providers can also be prepared in case your baby requires special care before or after birth.  Consider whether you want to continue with the pregnancy. In some cases, genetic testing may be done to learn about the traits a child will inherit. Types of genetic tests There are two basic types of genetic testing. Screening tests indicate whether your developing baby (fetus) is at higher risk for a genetic disorder. Diagnostic tests check actual fetal cells to diagnose a genetic disorder. Screening tests     Screening tests will not harm your baby. They are recommended for all pregnant women. Types of screening tests include:  Carrier screening. This test involves checking genes from both parents by testing their blood or saliva. The test checks to find out if the parents carry a genetic mutation that may be passed to a baby. In most cases,  both parents must carry the mutation for a baby to be at risk.  First trimester screening. This test combines a blood test with sound wave imaging of your baby (fetal ultrasound). This screening test checks for a risk of Down syndrome or other defects caused by having extra chromosomes. It also checks for defects of the heart, abdomen, or skeleton.  Second trimester screening also combines a blood test with a fetal ultrasound exam. It checks for a risk of genetic defects of the face, brain, spine, heart, or limbs.  Combined or sequential screening. This type of testing combines the results of first and second trimester screening. This type of testing may be more accurate than first or second trimester screening alone.  Cell-free DNA testing. This is a blood test that detects cells released by the placenta that get into the mother's blood. It can be used to check for a risk of Down syndrome, other extra chromosome syndromes, and disorders caused by abnormal numbers of sex chromosomes. This test can be done any time after 10 weeks of pregnancy.  Diagnostic tests Diagnostic tests carry slight risks of problems, including bleeding, infection, and loss of the pregnancy. These tests are done only if your baby is at risk for a genetic disorder. You may meet with a genetic counselor to discuss the risks and benefits before having diagnostic tests. Examples of diagnostic tests include:  Chorionic villus sampling (CVS). This involves a procedure to remove and test a sample of cells taken from the placenta. The procedure may be done between 10 and 12 weeks of pregnancy.  Amniocentesis. This involves a  procedure to remove and test a sample of fluid (amniotic fluid) and cells from the sac that surrounds the developing baby. The procedure may be done between 15 and 20 weeks of pregnancy. What do the results mean? For a screening test:  If the results are negative, it often means that your child is not at higher  risk. There is still a slight chance your child could have a genetic disorder.  If the results are positive, it does not mean your child will have a genetic disorder. It may mean that your child has a higher-than-normal risk for a genetic disorder. In that case, you may want to talk with a genetic counselor about whether you should have diagnostic genetic tests. For a diagnostic test:  If the result is negative, it is unlikely that your child will have a genetic disorder.  If the test is positive for a genetic disorder, it is likely that your child will have the disorder. The test may not tell how severe the disorder will be. Talk with your health care provider about your options. Questions to ask your health care provider Before talking to your health care provider about genetic testing, find out if there is a history of genetic disorders in your family. It may also help to know your family's ethnic origins. Then ask your health care provider the following questions:  Is my baby at risk for a genetic disorder?  What are the benefits of having genetic screening?  What tests are best for me and my baby?  What are the risks of each test?  If I get a positive result on a screening test, what is the next step?  Should I meet with a genetic counselor before having a diagnostic test?  Should my partner or other members of my family be tested?  How much do the tests cost? Will my insurance cover the testing? Summary  Genetic testing is done during pregnancy to find out whether your child is at risk for a genetic disorder.  Genetic testing is offered to all women before or during pregnancy. You can choose whether to have genetic testing.  There are two basic types of genetic testing. Screening tests indicate whether your developing baby (fetus) is at higher risk for a genetic disorder. Diagnostic tests check actual fetal cells to diagnose a genetic disorder.  If a diagnostic genetic test is  positive, talk with your health care provider about your options. This information is not intended to replace advice given to you by your health care provider. Make sure you discuss any questions you have with your health care provider. Document Released: 11/05/2017 Document Revised: 12/12/2018 Document Reviewed: 11/05/2017 Elsevier Patient Education  2020 [employer]vier Inc.  

## 2019-05-30 NOTE — Addendum Note (Signed)
Addended by: Quintella Baton D on: 05/30/2019 02:52 PM   Modules accepted: Orders

## 2019-05-31 LAB — RPR+RH+ABO+RUB AB+AB SCR+CB...
Antibody Screen: NEGATIVE
HIV Screen 4th Generation wRfx: NONREACTIVE
Hematocrit: 32.7 % — ABNORMAL LOW (ref 34.0–46.6)
Hemoglobin: 11.2 g/dL (ref 11.1–15.9)
Hepatitis B Surface Ag: NEGATIVE
MCH: 29 pg (ref 26.6–33.0)
MCHC: 34.3 g/dL (ref 31.5–35.7)
MCV: 85 fL (ref 79–97)
Platelets: 401 10*3/uL (ref 150–450)
RBC: 3.86 x10E6/uL (ref 3.77–5.28)
RDW: 14.9 % (ref 11.7–15.4)
RPR Ser Ql: NONREACTIVE
Rh Factor: POSITIVE
Rubella Antibodies, IGG: 10.2 index (ref >0.99)
Varicella zoster IgG: 318 index (ref >165)
WBC: 10.2 10*3/uL (ref 3.4–10.8)

## 2019-05-31 LAB — GLUCOSE, 1 HOUR GESTATIONAL: Gestational Diabetes Screen: 119 mg/dL (ref 65–139)

## 2019-06-02 NOTE — Telephone Encounter (Signed)
Patient is aware of location date and time 

## 2019-06-05 LAB — MATERNIT21 PLUS CORE+SCA
Fetal Fraction: 6
Monosomy X (Turner Syndrome): NOT DETECTED
Result (T21): NEGATIVE
Trisomy 13 (Patau syndrome): NEGATIVE
Trisomy 18 (Edwards syndrome): NEGATIVE
Trisomy 21 (Down syndrome): NEGATIVE
XXX (Triple X Syndrome): NOT DETECTED
XXY (Klinefelter Syndrome): NOT DETECTED
XYY (Jacobs Syndrome): NOT DETECTED

## 2019-06-13 ENCOUNTER — Other Ambulatory Visit: Payer: Self-pay

## 2019-06-13 ENCOUNTER — Encounter: Payer: Self-pay | Admitting: Oncology

## 2019-06-13 ENCOUNTER — Inpatient Hospital Stay: Payer: Medicaid Other | Attending: Oncology | Admitting: Oncology

## 2019-06-13 VITALS — BP 132/70 | HR 80 | Temp 98.8°F | Ht 70.0 in | Wt 367.0 lb

## 2019-06-13 DIAGNOSIS — R7989 Other specified abnormal findings of blood chemistry: Secondary | ICD-10-CM | POA: Diagnosis not present

## 2019-06-13 DIAGNOSIS — N649 Disorder of breast, unspecified: Secondary | ICD-10-CM | POA: Diagnosis not present

## 2019-06-13 DIAGNOSIS — Z803 Family history of malignant neoplasm of breast: Secondary | ICD-10-CM | POA: Insufficient documentation

## 2019-06-13 DIAGNOSIS — N6342 Unspecified lump in left breast, subareolar: Secondary | ICD-10-CM | POA: Diagnosis present

## 2019-06-13 DIAGNOSIS — E785 Hyperlipidemia, unspecified: Secondary | ICD-10-CM | POA: Insufficient documentation

## 2019-06-13 DIAGNOSIS — E669 Obesity, unspecified: Secondary | ICD-10-CM | POA: Insufficient documentation

## 2019-06-13 DIAGNOSIS — N6320 Unspecified lump in the left breast, unspecified quadrant: Secondary | ICD-10-CM | POA: Diagnosis present

## 2019-06-13 DIAGNOSIS — M5136 Other intervertebral disc degeneration, lumbar region: Secondary | ICD-10-CM | POA: Insufficient documentation

## 2019-06-13 NOTE — Progress Notes (Signed)
Hematology/Oncology Consult note [facility] Telephone:([phone] [phone]  Patient Care Team: Hubbard Hartshorn, FNP as PCP - General (Family Medicine)   Name of the patient: Ashley Herring  [id number]  1981-03-03    Reason for referral- left nipple hardness/mass   Referring physician- [facility] clinic  Date of visit: 06/13/19   History of presenting illness-patient is a 38 year old female G5, P2 L2 currently in her first trimester of pregnancy.  Patient was seen in the [facility] clinic for a pea-sized area of concern over her left nipple. This was followed by a mammogram and an ultrasound in June 2020 which did not reveal any evidence of malignancy in either breast particularly no significant abnormality was noted in the nipple.  Patient was then again seen in the [facility] clinic recently in September 2020 when the patient felt that the left nipple feels more abnormal.  She has never had any prior breast biopsies or abnormal mammograms.  Family history significant for breast cancer in her paternal aunt at the age of 46 who died in her early 73s.  Patient's menarche was at the age of 66 and her age of first child was 71.  Denies any other family history of breast or ovarian cancer. Patient currently denies any nipple discharge.  Denies any other complaints at this time  ECOG PS- 0  Pain scale- 0   Review of systems- Review of Systems  Constitutional: Negative for chills, fever, malaise/fatigue and weight loss.  HENT: Negative for congestion, ear discharge and nosebleeds.   Eyes: Negative for blurred vision.  Respiratory: Negative for cough, hemoptysis, sputum production, shortness of breath and wheezing.   Cardiovascular: Negative for chest pain, palpitations, orthopnea and claudication.  Gastrointestinal: Negative for abdominal pain, blood in stool, constipation, diarrhea, heartburn, melena, nausea and vomiting.  Genitourinary: Negative for dysuria, flank  pain, frequency, hematuria and urgency.  Musculoskeletal: Negative for back pain, joint pain and myalgias.  Skin: Negative for rash.  Neurological: Negative for dizziness, tingling, focal weakness, seizures, weakness and headaches.  Endo/Heme/Allergies: Does not bruise/bleed easily.  Psychiatric/Behavioral: Negative for depression and suicidal ideas. The patient does not have insomnia.     Allergies  Allergen Reactions   Peanuts [Peanut Oil] Swelling    Patient Active Problem List   Diagnosis Date Noted   Supervision of high risk pregnancy, antepartum 04/24/2019   Obesity affecting pregnancy, antepartum 04/24/2019   BMI 50.0-59.9, adult ([place]) 04/24/2019   Hyperlipidemia 04/04/2019   Elevated erythrocyte sedimentation rate 03/25/2019   Scleritis of left eye 03/25/2019   Spondylosis of lumbar joint 03/25/2019   Breast lump 01/31/2019   DDD (degenerative disc disease), lumbar 01/31/2019   Chronic left-sided low back pain with left-sided sciatica 01/31/2019   Extreme obesity 01/31/2019     Past Medical History:  Diagnosis Date   Back pain    Eye inflammation    Miscarriage    Sciatica      Past Surgical History:  Procedure Laterality Date   HEMORRHOID SURGERY      Social History   Socioeconomic History   Marital status: Married    Spouse name: Cornelia Copa   Number of children: 2   Years of education: Not on file   Highest education level: Not on file  Occupational History   Not on file  Social Needs   Financial resource strain: Not hard at all   Food insecurity    Worry: Never true    Inability: Never true   Transportation needs  Medical: No    Non-medical: No  Tobacco Use   Smoking status: Former Smoker    Quit date: 01/31/2016    Years since quitting: 3.3   Smokeless tobacco: Never Used  Substance and Sexual Activity   Alcohol use: No   Drug use: Never   Sexual activity: Yes    Partners: Male    Birth control/protection:  None  Lifestyle   Physical activity    Days per week: 7 days    Minutes per session: 40 min   Stress: To some extent  Relationships   Social connections    Talks on phone: More than three times a week    Gets together: More than three times a week    Attends religious service: 1 to 4 times per year    Active member of club or organization: Not on file    Attends meetings of clubs or organizations: 1 to 4 times per year    Relationship status: Married   Intimate partner violence    Fear of current or ex partner: No    Emotionally abused: No    Physically abused: No    Forced sexual activity: No  Other Topics Concern   Not on file  Social History Narrative   Have 2 children from previous relationships now married and they are trying to conceive    + POC urine preg 04/17/2019     Family History  Problem Relation Age of Onset   Hypertension Mother    Diabetes Mother    Hypertension Father    Diabetes Father    Asthma Daughter    Breast cancer Paternal Aunt [id number]       Deceased from metastasis   Cancer Maternal Grandmother    Brain cancer Maternal Grandmother    Brain cancer Paternal Grandmother    Colon cancer Paternal Grandfather      Current Outpatient Medications:    Doxylamine-Pyridoxine (DICLEGIS) 10-10 MG TBEC, Take 2 tablets by mouth at bedtime. If symptoms persist, add one tablet in the morning and one in the afternoon, Disp: 100 tablet, Rfl: 5   Prenatal Vit-Fe Fumarate-FA (PRENATAL VITAMINS PO), Take 1 tablet by mouth 1 day or 1 dose., Disp: , Rfl:    Physical exam:  Vitals:   06/13/19 1340  BP: 132/70  Pulse: 80  Temp: 98.8 F (37.1 C)  TempSrc: Tympanic  Weight: (!) 367 lb (166.5 kg)  Height: 5\' 10"  (1.778 m)   Physical Exam Constitutional:      General: She is not in acute distress.    Appearance: She is obese.  HENT:     Head: Normocephalic and atraumatic.  Eyes:     Pupils: Pupils are equal, round, and reactive to light.  Neck:      Musculoskeletal: Normal range of motion.  Cardiovascular:     Rate and Rhythm: Normal rate and regular rhythm.     Heart sounds: Normal heart sounds.  Pulmonary:     Effort: Pulmonary effort is normal.     Breath sounds: Normal breath sounds.  Abdominal:     General: Bowel sounds are normal.     Palpations: Abdomen is soft.  Skin:    General: Skin is warm and dry.  Neurological:     Mental Status: She is alert and oriented to person, place, and time.   No palpable masses in either breast no palpable bilateral axillary adenopathy.  Left nipple feels more firmer to palpation as compared to right but I  do not feel a distinct palpable mass.    CMP Latest Ref Rng & Units 03/17/2019  Glucose 70 - 99 mg/dL 94  BUN 6 - 20 mg/dL 12  Creatinine 0.35 - 4.65 mg/dL 6.81  Sodium 275 - 170 mmol/L 137  Potassium 3.5 - 5.1 mmol/L 3.8  Chloride 98 - 111 mmol/L 102  CO2 22 - 32 mmol/L 25  Calcium 8.9 - 10.3 mg/dL 8.9  Total Protein 6.1 - 8.1 g/dL -  Total Bilirubin 0.2 - 1.2 mg/dL -  Alkaline Phos 50 - 017 Unit/L -  AST 10 - 30 U/L -  ALT 6 - 29 U/L -   CBC Latest Ref Rng & Units 05/30/2019  WBC 3.4 - 10.8 x10E3/uL 10.2  Hemoglobin 11.1 - 15.9 g/dL 49.4  Hematocrit 49.6 - 46.6 % 32.7(L)  Platelets 150 - 450 x10E3/uL 401    No images are attached to the encounter.  US Ob Follow Up  Result Date: 05/20/2019 Patient Name: Kairie Vangieson DOB: August 26, 1981 MRN: [id number] ULTRASOUND REPORT Location: [facility] Date of Service: 05/15/2019 Indications:dating Findings: Mason Jim intrauterine pregnancy is visualized with a CRL consistent with [redacted]w[redacted]d gestation, giving an (U/S) EDD of 12/21/2019. The (U/S) EDD is consistent with the clinically established EDD of 12/15/2019. FHR: 164 BPM CRL measurement: 20.4 mm Yolk sac is visualized and appears normal and early anatomy is normal. Amnion: visualized and appears normal Impression: 1. [redacted]w[redacted]d Viable Singleton Intrauterine pregnancy by U/S. 2. (U/S) EDD is  consistent with Clinically established EDD of 12/15/2019. Deanna Artis, RT There is a viable singleton gestation.  Detailed evaluation of the fetal anatomy is precluded by early gestational age.  It must be noted that a normal ultrasound particular at this early gestational age is unable to rule out fetal aneuploidy, risk of first trimester miscarriage, or anatomic birth defects. Thomasene Mohair, MD, FACOG [facility], [facility] 05/20/2019 3:00 PM     Assessment and plan- Patient is a 38 y.o. female referred to high risk breast clinic for concerns with left nipple  Clinically her left nipple appears more for more to palpate as compared to right but I do not feel a distinct palpable mass.  No palpable masses in either breast or palpable axillary adenopathy.  Patient is currently in her first trimester of pregnancy and her recent mammogram from June 2020 was unremarkable.  I will refer her to surgery at this time to see if there would be any role for biopsy versus continued observation  In terms of Her lifetime risk for breast cancer based on the Tyrer- Cuzick model for breast cancer risk evaluation; I am getting a lifetime risk of breast cancer at 13.4% for her as compared to an average woman with a risk of 11%.  She therefore does not qualify for any additional high risk breast cancer screening such as MRI at this time despite her family history of breast cancer in her paternal aunt.  I will discuss her case at the next tumor board and reach out to the patient following that   Thank you for this kind referral and the opportunity to participate in the care of this patient   Visit Diagnosis 1. Abnormal nipple     Dr. Owens Shark, MD, MPH [facility] [phone] 06/13/2019  3:31 PM

## 2019-06-24 ENCOUNTER — Other Ambulatory Visit: Payer: Medicaid Other

## 2019-06-25 ENCOUNTER — Other Ambulatory Visit: Payer: Self-pay | Admitting: Maternal Newborn

## 2019-06-25 ENCOUNTER — Encounter: Payer: Self-pay | Admitting: Obstetrics & Gynecology

## 2019-06-25 DIAGNOSIS — O219 Vomiting of pregnancy, unspecified: Secondary | ICD-10-CM

## 2019-06-25 MED ORDER — PROMETHAZINE HCL 25 MG PO TABS: 25.0000 mg | ORAL_TABLET | Freq: Four times a day (QID) | ORAL | 2 refills | Status: DC | PRN

## 2019-06-25 NOTE — Progress Notes (Unsigned)
Sent Rx for Phenergan.

## 2019-06-26 ENCOUNTER — Ambulatory Visit: Payer: Medicaid Other | Admitting: General Surgery

## 2019-06-27 ENCOUNTER — Other Ambulatory Visit: Payer: Self-pay

## 2019-06-27 ENCOUNTER — Ambulatory Visit (INDEPENDENT_AMBULATORY_CARE_PROVIDER_SITE_OTHER): Payer: Medicaid Other | Admitting: Maternal Newborn

## 2019-06-27 ENCOUNTER — Encounter
Admission: RE | Admit: 2019-06-27 | Discharge: 2019-06-27 | Disposition: A | Discharge status: Home / Self Care | Payer: Medicaid Other | Source: Ambulatory Visit | Attending: Anesthesiology | Admitting: Anesthesiology

## 2019-06-27 VITALS — Wt 366.0 lb

## 2019-06-27 DIAGNOSIS — O219 Vomiting of pregnancy, unspecified: Secondary | ICD-10-CM | POA: Diagnosis not present

## 2019-06-27 DIAGNOSIS — Z3689 Encounter for other specified antenatal screening: Secondary | ICD-10-CM

## 2019-06-27 NOTE — Progress Notes (Signed)
Virtual Visit via Telephone Note  I connected with Ashley Herring on 06/27/19 at 8:33 AM EDT by telephone and verified that I am speaking with the correct person using two identifiers.  Location: Patient: Home Provider: Office  I discussed the limitations of performing an evaluation and management service by telephone and the availability of in person appointments. The patient expressed understanding and agreed to proceed with a telephone visit today.  Subjective  Ashley Herring is a 38 y.o. [id number] at [redacted]w[redacted]d being seen today for ongoing prenatal care.  She is currently monitored for the following issues for this high-risk pregnancy and has Breast lump; DDD (degenerative disc disease), lumbar; Chronic left-sided low back pain with left-sided sciatica; Extreme obesity; Elevated erythrocyte sedimentation rate; Scleritis of left eye; Spondylosis of lumbar joint; Hyperlipidemia; Supervision of high risk pregnancy, antepartum; Obesity affecting pregnancy, antepartum; BMI 50.0-59.9, adult ([place]); and Nausea and vomiting during pregnancy on their problem list.  ----------------------------------------------------------------------------------- Patient reports nausea, occasional vomiting. Will see if new prescription helps. She is usually able to eat small meals and keep food down. She has had some mild intermittent pain in her lower abdomen, which happens when changing positions (supine to sitting and sitting to standing). She has been having some headaches. No other concerns today. Vag. Bleeding: None.   ----------------------------------------------------------------------------------- The following portions of the patient's history were reviewed and updated as appropriate: allergies, current medications, past family history, past medical history, past social history, past surgical history and problem list. Problem list updated.   Objective  Weight (!) 366 lb (166 kg), last menstrual period  03/10/2019. Pregravid weight 364 lb (165.1 kg) Total Weight Gain 2 lb (0.907 kg)  Physical exam could not be completed as this was a telephone visit done during COVID-19 precautions.   Assessment   38 y.o. [id number] at [redacted]w[redacted]d EDD 12/24/2019, by Ultrasound presenting for a prenatal telephone visit.  Plan   pregnancy Problems (from 04/24/19 to present)    Problem Noted Resolved   Supervision of high risk pregnancy, antepartum 04/24/2019 by Rod Can, CNM No   Overview Addendum 04/26/2019 12:49 PM by Rod Can, [place] Prenatal Labs  Dating  Blood type:     Genetic Screen 1 Screen:    AFP:     Quad:     NIPS: Antibody:   Anatomic [place]  Rubella:   Varicella: @VZVIGG @  GTT Early:               Third trimester:  RPR: Non Reactive (07/13 1846)   Rhogam  HBsAg:     TDaP vaccine         Flu Shot: considering HIV:     Baby Food Considering breast                GBS:   Contraception  Pap: 2020 negative  CBB     CS/VBAC    Support Person Husband Cornelia Copa          Obesity affecting pregnancy, antepartum 04/24/2019 by Rod Can, CNM No   Overview Signed 05/01/2019  3:14 PM by Rexene Agent, CNM    BMI >=40 [ ]  early 1h gtt -  [ ]  u/s for dating [ ]   [ ]  nutritional goals [ ]  folic acid 1mg  [ ]  bASA (>12 weeks) [ ]  consider nutrition consult [ ]  consider maternal EKG 1st trimester [ ]  Growth u/s 28 [ ] , 32 [ ] , 36 weeks [ ]  [ ]  NST/AFI weekly 36+ weeks (  36[] , 37[] , 38[] , 39[] , 40[] ) [ ]  IOL by 41 weeks (scheduled, prn [] )       Discussed using magnesium supplements to help with headaches.  Anatomy scan next visit.  I discussed the assessment and treatment plan with the patient. The patient was provided an opportunity to ask questions and all were answered. The patient agreed with the plan and demonstrated an understanding of the instructions.   The patient was advised to call back or seek an in-person evaluation as needed.  I provided 7 minutes of  non-face-to-face time during this encounter.  Please refer to After Visit Summary for other counseling recommendations.   Return in about 4 weeks (around 07/25/2019) for ROB and anatomy scan.  , CNM 06/27/2019  8:47 AM

## 2019-06-27 NOTE — Patient Instructions (Signed)

## 2019-06-27 NOTE — Consult Note (Signed)
[facility] Anesthesia Consultation  Elverda Wendel UYQ:[id number] DOB: [birth year] DOA: 06/27/2019 PCP: Doren Custard, FNP   Requesting physician: Dr. Tiburcio Pea Date of consultation: 06/27/19 Reason for consultation: Obesity during pregnancy  CHIEF COMPLAINT:  Obesity during pregnancy  HISTORY OF PRESENT ILLNESS: Ashley Herring  is a 38 y.o. female with a known history of obesity in pregnancy. Denies any hx of cardiovascular disease. Denies hx of asthma, did use inhaler after pneumonia in the past. Denies personal or family hx of bleeding disorders. She has had 2 prior vaginal deliveries, epidural analgesia with first delivery and IV analgesia with second delivery. Planning on using IV analgesia for this delivery.   PAST MEDICAL HISTORY:   Past Medical History:  Diagnosis Date  . Back pain   . Eye inflammation   . Miscarriage   . Sciatica     PAST SURGICAL HISTORY:  Past Surgical History:  Procedure Laterality Date  . HEMORRHOID SURGERY      SOCIAL HISTORY:  Social History   Tobacco Use  . Smoking status: Former Smoker    Quit date: 01/31/2016    Years since quitting: 3.4  . Smokeless tobacco: Never Used  Substance Use Topics  . Alcohol use: No    FAMILY HISTORY:  Family History  Problem Relation Age of Onset  . Hypertension Mother   . Diabetes Mother   . Hypertension Father   . Diabetes Father   . Asthma Daughter   . Breast cancer Paternal Aunt 30       Deceased from metastasis  . Cancer Maternal Grandmother   . Brain cancer Maternal Grandmother   . Brain cancer Paternal Grandmother   . Colon cancer Paternal Grandfather     DRUG ALLERGIES:  Allergies  Allergen Reactions  . Peanuts [Peanut Oil] Swelling    REVIEW OF SYSTEMS:   RESPIRATORY: No cough, shortness of breath, wheezing.  CARDIOVASCULAR: No chest pain, orthopnea, edema.  HEMATOLOGY: No anemia, easy bruising or bleeding SKIN: No rash or  lesion. NEUROLOGIC: No tingling, numbness, weakness.  PSYCHIATRY: No anxiety or depression.   MEDICATIONS AT HOME:  Prior to Admission medications   Medication Sig Start Date End Date Taking? Authorizing Provider  Doxylamine-Pyridoxine (DICLEGIS) 10-10 MG TBEC Take 2 tablets by mouth at bedtime. If symptoms persist, add one tablet in the morning and one in the afternoon 05/28/19   Oswaldo Conroy, CNM  Prenatal Vit-Fe Fumarate-FA (PRENATAL VITAMINS PO) Take 1 tablet by mouth 1 day or 1 dose.    [provider]  promethazine (PHENERGAN) 25 MG tablet Take 1 tablet (25 mg total) by mouth every 6 (six) hours as needed for nausea or vomiting. 06/25/19   Oswaldo Conroy, CNM      PHYSICAL EXAMINATION:   VITAL SIGNS: Last menstrual period 03/10/2019.  GENERAL:  38 y.o.-year-old patient no acute distress.  HEENT: Head atraumatic, normocephalic. Oropharynx and nasopharynx clear. MP 2, TM distance >3 cm, normal mouth opening, grade 1 upper lip bite LUNGS: Normal breath sounds bilaterally, no wheezing, rales,rhonchi. No use of accessory muscles of respiration.  CARDIOVASCULAR: S1, S2 normal. No murmurs, rubs, or gallops.  EXTREMITIES: No pedal edema, cyanosis, or clubbing.  NEUROLOGIC: normal gait PSYCHIATRIC: The patient is alert and oriented x 3.  SKIN: No obvious rash, lesion, or ulcer.    IMPRESSION AND PLAN:   Ashley Herring  is a 38 y.o. female presenting with obesity during pregnancy. BMI is currently 52 at [redacted] weeks gestation.   Airway exam  reassuring today. Significant back adiposity, likely difficult epidural placement.  Did not discuss epidural analgesia today as patient is not planning this method. Plan to discuss at our next appointment when she is closer to delivery. Discussed increased risk of difficult intubation during pregnancy should an emergency cesarean delivery be required.   We discussed repeat evaluation at 34-36 weeks by anesthesia to determine whether  there is a high risk of complications of anesthesia for which we would recommend transfer of OB care to a facility with a higher maternal level of care designation.

## 2019-07-01 ENCOUNTER — Encounter: Payer: Self-pay | Admitting: *Deleted

## 2019-07-01 NOTE — Progress Notes (Signed)
Left message for patient to return my call.  Per discussion in breast case conference yesterday, it was recommended that patient return for another ultrasound of her left breast for the left nipple changes.  The patient has not been scheduled for surgical consult.

## 2019-07-03 ENCOUNTER — Encounter: Payer: Self-pay | Admitting: *Deleted

## 2019-07-03 ENCOUNTER — Other Ambulatory Visit: Payer: Self-pay | Admitting: *Deleted

## 2019-07-03 DIAGNOSIS — N63 Unspecified lump in unspecified breast: Secondary | ICD-10-CM

## 2019-07-03 NOTE — Progress Notes (Signed)
Aside from additional imaging, what else was recommended at case conference?  I'm not really sure what the concern is with this patient (haven't seen her yet, but reading the chart, I'm a little confused about the surgical referal).  Thanks!

## 2019-07-03 NOTE — Progress Notes (Addendum)
Tried to call patient again, but had to leave a message for her to return my call.  I would like to assist with scheduling patient for another left breast ultrasound as per recommendation from breast case conference.  I will cc this note to Dr. Celine Ahr.  She has an appointment with her for surgical consult next week.  Patient returned my call.  I have scheduled her for a left breast ultrasound on 07/09/19 to assess the nipple changes.  Patient states the area of concern has gotten larger.

## 2019-07-07 ENCOUNTER — Ambulatory Visit (INDEPENDENT_AMBULATORY_CARE_PROVIDER_SITE_OTHER): Payer: Medicaid Other | Admitting: Maternal Newborn

## 2019-07-07 ENCOUNTER — Encounter: Payer: Self-pay | Admitting: Obstetrics & Gynecology

## 2019-07-07 ENCOUNTER — Other Ambulatory Visit: Payer: Self-pay

## 2019-07-07 VITALS — BP 122/74 | Wt 362.0 lb

## 2019-07-07 DIAGNOSIS — O99612 Diseases of the digestive system complicating pregnancy, second trimester: Secondary | ICD-10-CM

## 2019-07-07 DIAGNOSIS — K219 Gastro-esophageal reflux disease without esophagitis: Secondary | ICD-10-CM

## 2019-07-07 DIAGNOSIS — O0990 Supervision of high risk pregnancy, unspecified, unspecified trimester: Secondary | ICD-10-CM

## 2019-07-07 DIAGNOSIS — O219 Vomiting of pregnancy, unspecified: Secondary | ICD-10-CM

## 2019-07-07 DIAGNOSIS — Z3A15 15 weeks gestation of pregnancy: Secondary | ICD-10-CM

## 2019-07-07 MED ORDER — PANTOPRAZOLE SODIUM 40 MG PO TBEC: 40.0000 mg | DELAYED_RELEASE_TABLET | Freq: Every day | ORAL | 3 refills | Status: DC

## 2019-07-07 MED ORDER — ONDANSETRON HCL 4 MG PO TABS: 4.0000 mg | ORAL_TABLET | Freq: Four times a day (QID) | ORAL | 1 refills | Status: DC | PRN

## 2019-07-07 NOTE — Progress Notes (Signed)
Work in [employer], throwing up with blood.

## 2019-07-07 NOTE — Telephone Encounter (Signed)
Can you call pt and have her worked in with [employer]

## 2019-07-07 NOTE — Telephone Encounter (Signed)
Patient is schedule for 07/07/19 at 1:50 with [facility]

## 2019-07-07 NOTE — Progress Notes (Signed)
Prenatal Problem Visit  Subjective  Ashley Herring is a 38 y.o. [id number] at [redacted]w[redacted]d being seen today for ongoing prenatal care.  She is currently monitored for the following issues for this high-risk pregnancy and has Breast lump; DDD (degenerative disc disease), lumbar; Chronic left-sided low back pain with left-sided sciatica; Extreme obesity; Elevated erythrocyte sedimentation rate; Scleritis of left eye; Spondylosis of lumbar joint; Hyperlipidemia; Supervision of high risk pregnancy, antepartum; Obesity affecting pregnancy, antepartum; BMI 50.0-59.9, adult (HCC); and Nausea and vomiting during pregnancy on their problem list.  ----------------------------------------------------------------------------------- Patient reports that her symptoms had improved, and although she had ongoing nausea, she had not vomited since 10/21. This morning she had four episodes of emesis. The first two were very close together followed by the third and fourth about 15 minutes later. The first emesis contained food/stomach contents, the second was liquid with a spot of blood, and the last two were also liquid with slightly more blood than the second. She reports acid reflux and low appetite; only able to eat small amounts of food before she feels full. Vag. Bleeding: None. No leaking of fluid.  ----------------------------------------------------------------------------------- The following portions of the patient's history were reviewed and updated as appropriate: allergies, current medications, past family history, past medical history, past social history, past surgical history and problem list. Problem list updated.  Objective  Blood pressure 122/74, weight (!) 362 lb (164.2 kg), last menstrual period 03/10/2019. Pregravid weight 364 lb (165.1 kg) Total Weight Gain -2 lb (-0.907 kg)   General:  Alert, oriented and cooperative. Patient is in no acute distress.  Skin: Skin is dry.   Cardiovascular: Normal  heart rate noted  Respiratory: Normal respiratory effort, no problems with respiration noted  Abdomen: Soft, gravid, appropriate for gestational age. Pain/Pressure: Absent     Pelvic:  Cervical exam deferred        Extremities: Normal range of motion.     Mental Status: Normal mood and affect. Normal behavior. Normal judgment and thought content.     Assessment   38 y.o. [id number] at [redacted]w[redacted]d, EDD 12/24/2019 by Ultrasound presenting for a work-in prenatal visit.  Plan   pregnancy Problems (from 04/24/19 to present)    Problem Noted Resolved   Supervision of high risk pregnancy, antepartum 04/24/2019 by Tresea Mall, CNM No   Overview Addendum 04/26/2019 12:49 PM by Tresea Mall, CNM    Clinic [facility] Prenatal Labs  Dating  Blood type:     Genetic Screen 1 Screen:    AFP:     Quad:     NIPS: Antibody:   Anatomic [place]  Rubella:   Varicella: @VZVIGG @  GTT Early:               Third trimester:  RPR: Non Reactive (07/13 1846)   Rhogam  HBsAg:     TDaP vaccine         Flu Shot: considering HIV:     Baby Food Considering breast                GBS:   Contraception  Pap: 2020 negative  CBB     CS/VBAC    Support Person Husband 2021          Obesity affecting pregnancy, antepartum 04/24/2019 by 04/26/2019, CNM No   Overview Signed 05/01/2019  3:14 PM by 05/03/2019, CNM    BMI >=40 [ ]  early 1h gtt -  [ ]  u/s for dating [ ]   [ ]  nutritional  goals [ ]  folic acid 1mg  [ ]  bASA (>12 weeks) [ ]  consider nutrition consult [ ]  consider maternal EKG 1st trimester [ ]  Growth u/s 28 [ ] , 32 [ ] , 36 weeks [ ]  [ ]  NST/AFI weekly 36+ weeks (36[] , 37[] , 38[] , 39[] , 40[] ) [ ]  IOL by 41 weeks (scheduled, prn [] )        Discussed that blood in emesis was likely related to multiple closely spaced episodes with a lot of acidic content irritating the esophagus/GI mucosa. Will try antacid.  Also talked about strategies for low appetite such as eating small and frequent meals, protein  based foods with carbohydrates.  Rx sent for Zofran and Protonix, Phenergan discontinued.  Will go to hospital for IV hydration if she cannot tolerate food/fluids for greater than 24 hours.  Keep ROB scheduled for 11/20.  Avel Sensor, CNM 07/07/2019  3:02 PM

## 2019-07-07 NOTE — Telephone Encounter (Signed)
Please advise 

## 2019-07-08 ENCOUNTER — Ambulatory Visit: Payer: Medicaid Other | Admitting: General Surgery

## 2019-07-09 ENCOUNTER — Ambulatory Visit
Admission: RE | Admit: 2019-07-09 | Discharge: 2019-07-09 | Disposition: A | Discharge status: Home / Self Care | Payer: Medicaid Other | Source: Ambulatory Visit | Attending: Oncology | Admitting: Oncology

## 2019-07-09 ENCOUNTER — Encounter: Payer: Self-pay | Admitting: Obstetrics & Gynecology

## 2019-07-09 ENCOUNTER — Other Ambulatory Visit: Payer: Self-pay

## 2019-07-09 DIAGNOSIS — N63 Unspecified lump in unspecified breast: Secondary | ICD-10-CM

## 2019-07-10 ENCOUNTER — Ambulatory Visit (INDEPENDENT_AMBULATORY_CARE_PROVIDER_SITE_OTHER): Payer: Medicaid Other | Admitting: General Surgery

## 2019-07-10 ENCOUNTER — Other Ambulatory Visit: Payer: Self-pay

## 2019-07-10 ENCOUNTER — Encounter: Payer: Self-pay | Admitting: General Surgery

## 2019-07-10 VITALS — BP 136/83 | HR 91 | Temp 98.1°F | Ht 66.5 in | Wt 361.0 lb

## 2019-07-10 DIAGNOSIS — N6459 Other signs and symptoms in breast: Secondary | ICD-10-CM | POA: Diagnosis not present

## 2019-07-10 HISTORY — DX: Other signs and symptoms in breast: N64.59

## 2019-07-10 NOTE — Progress Notes (Signed)
Patient ID: Ashley Herring, female   DOB: Jan 20, 1981, 38 y.o.   MRN: [id number]  Chief Complaint  Patient presents with  . New Patient (Initial Visit)    nipple thickening    HPI Ashley Herring is a 38 y.o. female.   She has been referred for further evaluation of left nipple thickening.  She is a 38 year old woman who is currently about 16 or [redacted] weeks pregnant.  She says that she noticed that her left nipple felt different from the right back in June 2020, prior to becoming pregnant.  She says that it started out the size of the tip of a ballpoint pen but has grown since then.  A mammogram and ultrasound were performed that were negative for any abnormalities.  She was seen by Ashley Herring in the cancer center.  She was then discussed in tumor board.  Recommendations were made for repeat ultrasound.  This was performed yesterday and again no abnormal findings were appreciated.  She was referred to general surgery to evaluate whether or not biopsy is indicated.  She has never had a breast biopsy nor has she ever had any abnormal imaging.  She states that her age of menarche was about 77 or 39.  She had her first child at 72 and has had a total of 3 pregnancies.  She did not breast-feed either of her previous children.  She states that for birth control in the past, she received a Depo-Provera shot in 2002 but then used an intrauterine device from 2007-2018.  She has not taken oral contraceptive pills or hormone replacement therapy.  She has a paternal aunt who died of breast cancer around the age of 85.  No family history of ovarian or uterine cancer.  She denies any nipple discharge or skin changes.  She states that the area of concern is painful.   Past Medical History:  Diagnosis Date  . Back pain   . Eye inflammation   . Miscarriage   . Sciatica     Past Surgical History:  Procedure Laterality Date  . HEMORRHOID SURGERY      Family History  Problem Relation Age of Onset  . Hypertension  Mother   . Diabetes Mother   . Hypertension Father   . Diabetes Father   . Asthma Daughter   . Breast cancer Paternal Aunt 61       Deceased from metastasis  . Cancer Maternal Grandmother   . Brain cancer Maternal Grandmother   . Brain cancer Paternal Grandmother   . Colon cancer Paternal Grandfather     Social History Social History   Tobacco Use  . Smoking status: Former Smoker    Quit date: 01/31/2016    Years since quitting: 3.4  . Smokeless tobacco: Never Used  Substance Use Topics  . Alcohol use: No  . Drug use: Never    Allergies  Allergen Reactions  . Peanuts [Peanut Oil] Swelling    Current Outpatient Medications  Medication Sig Dispense Refill  . Doxylamine-Pyridoxine (DICLEGIS) 10-10 MG TBEC Take 2 tablets by mouth at bedtime. If symptoms persist, add one tablet in the morning and one in the afternoon 100 tablet 5  . ondansetron (ZOFRAN) 4 MG tablet Take 1 tablet (4 mg total) by mouth every 6 (six) hours as needed for nausea or vomiting. 20 tablet 1  . pantoprazole (PROTONIX) 40 MG tablet Take 1 tablet (40 mg total) by mouth daily. 30 tablet 3  . Prenatal Vit-Fe Fumarate-FA (PRENATAL VITAMINS PO)  Take 1 tablet by mouth 1 day or 1 dose.     No current facility-administered medications for this visit.     Review of Systems Review of Systems  All other systems reviewed and are negative.   Blood pressure 136/83, pulse 91, temperature 98.1 F (36.7 C), height 5' 6.5" (1.689 m), weight (!) 361 lb (163.7 kg), last menstrual period 03/10/2019, SpO2 98 %. Body mass index is 57.39 kg/m.  Physical Exam Physical Exam Vitals signs reviewed. Exam conducted with a chaperone present.  Constitutional:      General: She is not in acute distress.    Appearance: She is obese.  HENT:     Head: Normocephalic and atraumatic.     Nose:     Comments: Covered with a mask secondary to COVID-19 precautions    Mouth/Throat:     Comments: Covered with a mask secondary to  COVID-19 precautions Eyes:     General: No scleral icterus.       Right eye: No discharge.        Left eye: No discharge.  Neck:     Musculoskeletal: No neck rigidity.  Cardiovascular:     Rate and Rhythm: Normal rate and regular rhythm.     Heart sounds: Murmur present.     Comments: 1 out of 6 systolic ejection murmur Pulmonary:     Effort: Pulmonary effort is normal. No respiratory distress.     Comments: Breath sounds difficult to appreciate secondary to patient body habitus Chest:     Breasts:        Right: Normal.        Left: Tenderness present. No swelling, bleeding, inverted nipple, mass or nipple discharge.     Comments: The left nipple is possibly more thickened and firm when compared to the right.  There is very little discernible difference between the 2.  The areolae are normal.  The left nipple is tender right in the center.  With perpendicular pressure, I sense there may be a slight firmness there, but certainly I do not appreciate any masses. Abdominal:     Palpations: Abdomen is soft.     Comments: Protuberant, consistent with her level of obesity.  Gravid uterus is not palpated.  Genitourinary:    Comments: Deferred Musculoskeletal:     Comments: 1+ nonpitting edema at the ankles.  Lymphadenopathy:     Cervical: No cervical adenopathy.     Upper Body:     Right upper body: No supraclavicular, axillary or pectoral adenopathy.     Left upper body: No supraclavicular, axillary or pectoral adenopathy.  Skin:    General: Skin is warm and dry.  Neurological:     General: No focal deficit present.     Mental Status: She is alert and oriented to person, place, and time.  Psychiatric:        Mood and Affect: Mood normal.        Behavior: Behavior normal.     Data Reviewed I reviewed both sets of recent imaging, the mammogram and ultrasound done in June, as well as the more recent ultrasound done yesterday.  The radiologist interpretations are copied  here.  CLINICAL DATA:  Left nipple deformity noted by the patient for approximately 1 month.  EXAM: DIGITAL DIAGNOSTIC BILATERAL MAMMOGRAM WITH CAD AND TOMO  ULTRASOUND LEFT BREAST  COMPARISON:  Previous exam(s).  ACR Breast Density Category b: There are scattered areas of fibroglandular density.  FINDINGS: Mammographically, there are no suspicious masses, areas  of architectural distortion or microcalcifications in either breast.  Mammographic images were processed with CAD.  On physical exam, no suspicious masses are palpated. No significant abnormalities of the nipple.  Targeted ultrasound is performed, showing no suspicious masses or shadowing lesions in the retroareolar left breast.  IMPRESSION: No mammographic or sonographic evidence of malignancy in either breast.  RECOMMENDATION: Further management of patient's left breast nipple deformity should be based on clinical grounds.  Otherwise, screening mammogram at age 89 unless there are persistent or intervening clinical concerns. (Code:SM-B-40A)  I have discussed the findings and recommendations with the patient. Results were also provided in writing at the conclusion of the visit. If applicable, a reminder letter will be sent to the patient regarding the next appointment.  BI-RADS CATEGORY  1: Negative.  CLINICAL DATA:  38 year old female [redacted] weeks pregnant. Patient complains of increased thickening of the left nipple. Patient had a normal diagnostic mammogram and ultrasound in June of 2020.  EXAM: ULTRASOUND OF THE LEFT BREAST  COMPARISON:  Previous exam(s).  FINDINGS: On physical exam, the left nipple is enlarged and thick to palpation when compared to the right nipple. There is no erythema or ulceration.  Targeted ultrasound is performed, showing normal tissue in the subareolar region of the left breast. No solid or cystic mass, abnormal shadowing or distortion visualized.  Sonographic evaluation of the left axilla does not show any enlarged adenopathy.  IMPRESSION: No sonographic evidence of malignancy in the subareolar region of the left breast.  RECOMMENDATION: Surgical consultation with possible punch biopsy of the left nipple is recommended. The patient is scheduled to see Dr. Duanne GuessJennifer Jasira Herring at [facility] on 07/10/2019.  I have discussed the findings and recommendations with the patient. If applicable, a reminder letter will be sent to the patient regarding the next appointment.  BI-RADS CATEGORY  1: Negative.  Assessment This is a 38 year old woman who has subjective complaints of thickening in her left nipple.  On physical exam, the difference between the right and left nipples is fairly subtle.  I do think there is some degree of induration or tissue thickening, but it is minimal.  I do not appreciate any masses and there are no overlying skin changes such as erythema or scaling.  Imaging studies are negative.  To be honest, I am not certain what might be going on with this patient.  She does have a family history of breast cancer at a young age in a paternal aunt.  Plan I am wary of performing any sort of surgical intervention on this patient, as the area of concern does seem to potentially involve her milk ducts and she is pregnant, planning to breast-feed.  I do not think a punch biopsy is likely to be high yield.  While my inclination is to simply observe for now, my lack of comfort and familiarity with the potential issues at hand lead me to recommend that she seek evaluation with a fellowship-trained breast surgeon, rather than just a general surgeon or as in my case, an endocrine surgeon.  [facility]  We we will place the referral request and see her back on an as-needed basis.    Ashley GuessJennifer Rylynn Herring 07/10/2019, 5:24 PM

## 2019-07-10 NOTE — Patient Instructions (Signed)
We will refer you to a breast specialists at [facility] for further evaluation of this area.  They will contact you to schedule this appointment.  Call us if you do not hear back from them in the next two weeks.

## 2019-07-25 ENCOUNTER — Encounter: Payer: Self-pay | Admitting: Obstetrics & Gynecology

## 2019-07-25 ENCOUNTER — Other Ambulatory Visit: Payer: Self-pay

## 2019-07-25 ENCOUNTER — Ambulatory Visit (INDEPENDENT_AMBULATORY_CARE_PROVIDER_SITE_OTHER): Payer: Medicaid Other | Admitting: Obstetrics & Gynecology

## 2019-07-25 ENCOUNTER — Ambulatory Visit (INDEPENDENT_AMBULATORY_CARE_PROVIDER_SITE_OTHER): Payer: Medicaid Other

## 2019-07-25 VITALS — BP 122/80 | Wt 360.0 lb

## 2019-07-25 DIAGNOSIS — Z3689 Encounter for other specified antenatal screening: Secondary | ICD-10-CM

## 2019-07-25 DIAGNOSIS — O0992 Supervision of high risk pregnancy, unspecified, second trimester: Secondary | ICD-10-CM

## 2019-07-25 DIAGNOSIS — Z363 Encounter for antenatal screening for malformations: Secondary | ICD-10-CM | POA: Diagnosis not present

## 2019-07-25 DIAGNOSIS — O0990 Supervision of high risk pregnancy, unspecified, unspecified trimester: Secondary | ICD-10-CM

## 2019-07-25 DIAGNOSIS — O99210 Obesity complicating pregnancy, unspecified trimester: Secondary | ICD-10-CM

## 2019-07-25 DIAGNOSIS — O99212 Obesity complicating pregnancy, second trimester: Secondary | ICD-10-CM

## 2019-07-25 DIAGNOSIS — Z3A18 18 weeks gestation of pregnancy: Secondary | ICD-10-CM

## 2019-07-25 LAB — POCT URINALYSIS DIPSTICK OB: Glucose, UA: NEGATIVE

## 2019-07-25 NOTE — Patient Instructions (Signed)

## 2019-07-25 NOTE — Addendum Note (Signed)
Addended by: Quintella Baton D on: 07/25/2019 04:19 PM   Modules accepted: Orders

## 2019-07-25 NOTE — Progress Notes (Signed)
  Subjective  Fetal Movement? yes Contractions? no Leaking Fluid? no Vaginal Bleeding? no  Objective  BP 122/80   Wt (!) 360 lb (163.3 kg)   LMP 03/10/2019 (Exact Date)   BMI 57.24 kg/m  General: NAD Pumonary: no increased work of breathing Abdomen: gravid, non-tender Extremities: no edema Psychiatric: mood appropriate, affect full  Assessment  38 y.o. [id number] at [redacted]w[redacted]d by  12/24/2019, by Ultrasound presenting for routine prenatal visit  Plan   Problem List Items Addressed This Visit      Other   Supervision of high risk pregnancy, antepartum   Obesity affecting pregnancy, antepartum    Other Visit Diagnoses    [redacted] weeks gestation of pregnancy    -  Primary   Screening, antenatal, for fetal anatomic survey       Relevant Orders   US OB Follow Up      pregnancy Problems (from 04/24/19 to present)    Problem Noted Resolved   Supervision of high risk pregnancy, antepartum 04/24/2019 by Rod Can, CNM No   Overview Addendum 04/26/2019 12:49 PM by Rod Can, [place] Prenatal Labs  Dating  Blood type:     Genetic Screen 1 Screen:    AFP:     Quad:     NIPS: Antibody:   Anatomic [place]  Rubella:   Varicella: @VZVIGG @  GTT Early:               Third trimester:  RPR: Non Reactive (07/13 1846)   Rhogam  HBsAg:     TDaP vaccine         Flu Shot: considering HIV:     Baby Food Considering breast                GBS:   Contraception  Pap: 2020 negative  CBB     CS/VBAC    Support Person Husband Cornelia Copa          Obesity affecting pregnancy, antepartum 04/24/2019 by Rod Can, CNM No   Overview Signed 05/01/2019  3:14 PM by Rexene Agent, CNM    BMI >=40 [ ]  early 1h gtt -  [ ]  u/s for dating [ ]   [ ]  nutritional goals [ ]  folic acid 1mg  [ ]  bASA (>12 weeks) [ ]  consider nutrition consult [ ]  consider maternal EKG 1st trimester [ ]  Growth u/s 28 [ ] , 32 [ ] , 36 weeks [ ]  [ ]  NST/AFI weekly 36+ weeks (36[] , 37[] , 38[] , 39[] , 40[] ) [ ]  IOL by  41 weeks (scheduled, prn [] )        Obesity risk factors discussed  [place] discussed      Needs follow up 4 weeks anatomy  Nausea mgt discussed       Cont meds  PNV, add baby ASA daily  Barnett Applebaum, MD, [facility], [facility] 07/25/2019  4:16 PM

## 2019-07-28 ENCOUNTER — Encounter: Payer: Self-pay | Admitting: Obstetrics & Gynecology

## 2019-07-30 NOTE — Progress Notes (Signed)
Patient has been referred to [facility] Breast Surgery. She is scheduled to see Dr Rush Farmer on 07/29/19.

## 2019-08-05 ENCOUNTER — Telehealth: Payer: Self-pay

## 2019-08-05 ENCOUNTER — Encounter: Payer: Self-pay | Admitting: Obstetrics & Gynecology

## 2019-08-05 ENCOUNTER — Other Ambulatory Visit: Payer: Self-pay

## 2019-08-05 ENCOUNTER — Ambulatory Visit (INDEPENDENT_AMBULATORY_CARE_PROVIDER_SITE_OTHER): Payer: Medicaid Other | Admitting: Obstetrics & Gynecology

## 2019-08-05 VITALS — BP 122/80 | Wt 360.0 lb

## 2019-08-05 DIAGNOSIS — Z3A19 19 weeks gestation of pregnancy: Secondary | ICD-10-CM

## 2019-08-05 DIAGNOSIS — O4692 Antepartum hemorrhage, unspecified, second trimester: Secondary | ICD-10-CM

## 2019-08-05 NOTE — Progress Notes (Signed)
Obstetric Problem Visit   Chief Complaint: Bleeding  History of Present Illness: Patient is a 38 y.o. [id number] [redacted]w[redacted]d presenting for bleeding.  The onset of bleeding was this am, noted pink on tissue when wiping.  No recent trauma, sex, infection.  Denies ctx's, pain, fever.  Prior (last) NSVD 13 years ago, no LEEP or D&C or EAB since that time.  Is Rh status: A+  PMHx: She  has a past medical history of Back pain, Eye inflammation, Miscarriage, and Sciatica. Also,  has a past surgical history that includes Hemorrhoid surgery., family history includes Asthma in her daughter; Brain cancer in her maternal grandmother and paternal grandmother; Breast cancer (age of onset: 21) in her paternal aunt; Cancer in her maternal grandmother; Colon cancer in her paternal grandfather; Diabetes in her father and mother; Hypertension in her father and mother.,  reports that she quit smoking about 3 years ago. She has never used smokeless tobacco. She reports that she does not drink alcohol or use drugs.  She has a current medication list which includes the following prescription(s): doxylamine-pyridoxine, ondansetron, pantoprazole, and prenatal vit-fe fumarate-fa. Also, is allergic to peanuts [peanut oil].  Review of Systems  Constitutional: Negative for chills, fever and malaise/fatigue.  HENT: Negative for congestion, sinus pain and sore throat.   Eyes: Negative for blurred vision and pain.  Respiratory: Negative for cough and wheezing.   Cardiovascular: Negative for chest pain and leg swelling.  Gastrointestinal: Negative for abdominal pain, constipation, diarrhea, heartburn, nausea and vomiting.  Genitourinary: Negative for dysuria, frequency, hematuria and urgency.  Musculoskeletal: Negative for back pain, joint pain, myalgias and neck pain.  Skin: Negative for itching and rash.  Neurological: Negative for dizziness, tremors and weakness.  Endo/Heme/Allergies: Does not bruise/bleed easily.   Psychiatric/Behavioral: Negative for depression. The patient is not nervous/anxious and does not have insomnia.     Objective: Vitals:   08/05/19 1419  BP: 122/80   Physical Exam Constitutional:      General: She is not in acute distress.    Appearance: She is well-developed.  Genitourinary:     Pelvic exam was performed with patient supine.     Vagina and uterus normal.     No vaginal erythema or bleeding.     No cervical motion tenderness, discharge, polyp or nabothian cyst.     Uterus is mobile.     Uterus is not enlarged.     No uterine mass detected.    Uterus is midaxial.     No right or left adnexal mass present.     Right adnexa not tender.     Left adnexa not tender.  HENT:     Head: Normocephalic and atraumatic.     Nose: Nose normal.  Abdominal:     General: There is no distension.     Palpations: Abdomen is soft.     Tenderness: There is no abdominal tenderness.  Musculoskeletal: Normal range of motion.  Neurological:     Mental Status: She is alert and oriented to person, place, and time.     Cranial Nerves: No cranial nerve deficit.  Skin:    General: Skin is warm and dry.  Psychiatric:        Attention and Perception: Attention normal.        Mood and Affect: Mood and affect normal.        Speech: Speech normal.        Behavior: Behavior normal.        Thought  Content: Thought content normal.        Judgment: Judgment normal.   FHT 150s  Assessment: 38 y.o. [id number] [redacted]w[redacted]d  Plan: Problem List Items Addressed This Visit    Second trimester bleeding    -  Primary     The incidence and clinical course of second trimester bleeding is discussed in detail with the patient today.  Likely cervical, not likely incompetence based on exam, and not likely detrimental to the pregnancy at this point. There is no clearly documented benefit to limiting or modifying activity and sexual intercourse in altering clinic course.    The patient is Rh POS, rhogam is  therefore not indicated to decrease the risk rhesus alloimmunization.    Routine bleeding precautions were discussed with the patient prior the conclusion of today's visit.  Barnett Applebaum, MD, [facility], [facility] 08/05/2019  2:53 PM

## 2019-08-05 NOTE — Telephone Encounter (Signed)
Pt transferred to me by front desk. Reports having pink vaginal d/c. Denies cramping, LOF, no intercourse past 72 hours. Patient concerned. Apt scheduled for 2:10 today w/RPH for eval.

## 2019-08-22 ENCOUNTER — Ambulatory Visit (INDEPENDENT_AMBULATORY_CARE_PROVIDER_SITE_OTHER): Payer: Medicaid Other

## 2019-08-22 ENCOUNTER — Other Ambulatory Visit: Payer: Self-pay

## 2019-08-22 ENCOUNTER — Ambulatory Visit (INDEPENDENT_AMBULATORY_CARE_PROVIDER_SITE_OTHER): Payer: Medicaid Other | Admitting: Obstetrics and Gynecology

## 2019-08-22 VITALS — BP 128/84 | Wt 358.0 lb

## 2019-08-22 DIAGNOSIS — O99212 Obesity complicating pregnancy, second trimester: Secondary | ICD-10-CM

## 2019-08-22 DIAGNOSIS — Z362 Encounter for other antenatal screening follow-up: Secondary | ICD-10-CM

## 2019-08-22 DIAGNOSIS — O99210 Obesity complicating pregnancy, unspecified trimester: Secondary | ICD-10-CM

## 2019-08-22 DIAGNOSIS — O0992 Supervision of high risk pregnancy, unspecified, second trimester: Secondary | ICD-10-CM

## 2019-08-22 DIAGNOSIS — Z3A22 22 weeks gestation of pregnancy: Secondary | ICD-10-CM

## 2019-08-22 DIAGNOSIS — Z3689 Encounter for other specified antenatal screening: Secondary | ICD-10-CM

## 2019-08-22 DIAGNOSIS — O0990 Supervision of high risk pregnancy, unspecified, unspecified trimester: Secondary | ICD-10-CM

## 2019-08-22 NOTE — Progress Notes (Addendum)
Routine Prenatal Care Visit  Subjective  Ashley Herring is a 38 y.o. [id number] at [redacted]w[redacted]d being seen today for ongoing prenatal care.  She is currently monitored for the following issues for this high-risk pregnancy and has Breast lump; DDD (degenerative disc disease), lumbar; Chronic left-sided low back pain with left-sided sciatica; Extreme obesity; Elevated erythrocyte sedimentation rate; Scleritis of left eye; Spondylosis of lumbar joint; Hyperlipidemia; Supervision of high risk pregnancy, antepartum; Obesity affecting pregnancy, antepartum; BMI 50.0-59.9, adult (HCC); Nausea and vomiting during pregnancy; and Nipple problem on their problem list.  ----------------------------------------------------------------------------------- Patient reports no complaints.  Had scleritis earlier this year feels has a little area of redness corner left eye.  No irritation no vision changes Contractions: Not present. Vag. Bleeding: None.  Movement: Present. Denies leaking of fluid.  ----------------------------------------------------------------------------------- The following portions of the patient's history were reviewed and updated as appropriate: allergies, current medications, past family history, past medical history, past social history, past surgical history and problem list. Problem list updated.   Objective  Blood pressure 128/84, weight (!) 358 lb (162.4 kg), last menstrual period 03/10/2019. Pregravid weight 364 lb (165.1 kg) Total Weight Gain -6 lb (-2.722 kg)  Body mass index is 56.92 kg/m.  Urinalysis:      Fetal Status: Fetal Heart Rate (bpm): 145   Movement: Present     General:  Alert, oriented and cooperative. Patient is in no acute distress.  Skin: Skin is warm and dry. No rash noted.   Cardiovascular: Normal heart rate noted  Respiratory: Normal respiratory effort, no problems with respiration noted  Abdomen: Soft, gravid, appropriate for gestational age. Pain/Pressure:  Present     Pelvic:  Cervical exam deferred        Extremities: Normal range of motion.     ental Status: Normal Herring and affect. Normal behavior. Normal judgment and thought content.   US OB Comp + 14 Wk  Result Date: 07/28/2019 Patient Name: Ashley Herring DOB: 23-Nov-1980 MRN: [id number] ULTRASOUND REPORT Location: [facility] Date of Service: 07/25/2019 Indications:Anatomy Ultrasound Findings: Ashley Herring intrauterine pregnancy is visualized with FHR at 160 BPM. Biometrics give an (U/S) Gestational age of [redacted]w[redacted]d and an (U/S) EDD of 12/18/2019; this correlates with the clinically established Estimated Date of Delivery: 12/24/19 Fetal presentation is Breech. EFW: 271 g ( 10 oz ). Placenta: fundal. Grade: 0 AFI: subjectively normal. Anatomic survey is incomplete for transverse spine, AA, 3VV and normal; Gender - female.  Right Ovary:not visualized Left Ovary: is not visualized. Survey of the adnexa demonstrates no adnexal masses. There is no free peritoneal fluid in the cul de sac. Impression: 1. [redacted]w[redacted]d Viable Singleton Intrauterine pregnancy by U/S. 2. (U/S) EDD is consistent with Clinically established Estimated Date of Delivery: 12/24/19 . 3. Anatomy scan is incomplete for the structures mentioned above. Recommendations: 1.Clinical correlation with the patient's History and Physical Exam. Ashley Herring, RT Review of ULTRASOUND. I have personally reviewed images and report of recent ultrasound done at [facility]. There is a singleton gestation with subjectively normal amniotic fluid volume. The fetal biometry correlates with established dating. Detailed evaluation of the fetal anatomy was performed.The fetal anatomical survey appears within normal limits within the resolution of ultrasound as described above.  It must be noted that a normal ultrasound is unable to rule out fetal aneuploidy.  Ashley Major, MD, [facility], [facility] 07/28/2019  11:58 AM   US OB Follow Up  Result  Date: 08/22/2019 Patient Name: Ashley Herring DOB: 09/30/1980 MRN:  [id number] ULTRASOUND REPORT Location: [facility] Date of Service: 08/22/2019 Indications: Anatomy follow up ultrasound Findings: Singleton intrauterine pregnancy is visualized with FHR at 147 BPM. Fetal presentation is Cephalic. Placenta: posterior. Grade: 1 AFI: subjectively normal. Anatomic survey is complete. There is no free peritoneal fluid in the cul de sac. Impression: 1. [redacted]w[redacted]d Viable Singleton Intrauterine pregnancy previously established criteria. 2. Normal Anatomy Scan is now complete Recommendations: 1.Clinical correlation with the patient's History and Physical Exam. Ashley Herring, RT There is a singleton gestation with subjectively normal amniotic fluid volume.  Limited evaluation of the fetal anatomy was performed today, focusing on on anatomic structures not fully visualized at the time of prior study.The visualized fetal anatomical survey appears within normal limits within the resolution of ultrasound as described above, and the anatomic survey is now complete.  It must be noted that a normal ultrasound is unable to rule out fetal aneuploidy, subtle defects such as small ASD or VDS may also not be visible on imaging. Ashley Mood, MD, [facility], [facility] 08/22/2019, 3:14 PM     Assessment   38 y.o. [id number] at [redacted]w[redacted]d by  12/24/2019, by Ultrasound presenting for routine prenatal visit  Plan   pregnancy Problems (from 04/24/19 to present)    Problem Noted Resolved   Supervision of high risk pregnancy, antepartum 04/24/2019 by Ashley Herring, Ashley Herring No   Overview Addendum 08/22/2019  4:07 PM by Ashley Herring, [place] Prenatal Labs  Dating 6 week [place] Blood type: A/Positive/-- (09/25 1539)   Genetic Screen NIPS: normal XY Antibody:Negative (09/25 1539)  Anatomic [place] Complete and normal 08/22/2019 Rubella: 10.20 (09/25 1539) Varicella: Immune  GTT Early: 35 Third  trimester:  RPR: Non Reactive (09/25 1539)   Rhogam N/A HBsAg: Negative (09/25 1539)   TDaP vaccine         Flu Shot: considering HIV: Non Reactive (09/25 1539)   Baby Food Considering breast                GBS:   Contraception  Pap: 2020 negative  CBB     CS/VBAC    Support Person Husband Cornelia Copa          Obesity affecting pregnancy, antepartum 04/24/2019 by Ashley Herring, Ashley Herring No   Overview Signed 05/01/2019  3:14 PM by Rexene Agent, Ashley Herring    BMI >=40 [ ]  early 1h gtt -  [ ]  u/s for dating [ ]   [ ]  nutritional goals [ ]  folic acid 1mg  [ ]  bASA (>12 weeks) [ ]  consider nutrition consult [ ]  consider maternal EKG 1st trimester [ ]  Growth u/s 28 [ ] , 32 [ ] , 36 weeks [ ]  [ ]  NST/AFI weekly 36+ weeks (36[] , 37[] , 38[] , 39[] , 40[] ) [ ]  IOL by 41 weeks (scheduled, prn [] )          Gestational age appropriate obstetric precautions including but not limited to vaginal bleeding, contractions, leaking of fluid and fetal movement were reviewed in detail with the patient.    - scleritis unsure if residual from prior episode or recurrence.  Monitor if worsening would confer with Michie who has seen patient previously as to treatment options including oral steroids.    Return in about 4 weeks (around 09/19/2019) for 4 weeks ROB, 6 week ROB and 28 week labs.  Ashley Mood, MD, [facility], [facility] 08/22/2019, 4:08 PM

## 2019-08-22 NOTE — Progress Notes (Signed)
ROB

## 2019-09-02 ENCOUNTER — Encounter: Payer: Self-pay | Admitting: Family Medicine

## 2019-09-02 ENCOUNTER — Ambulatory Visit (INDEPENDENT_AMBULATORY_CARE_PROVIDER_SITE_OTHER): Payer: Medicaid Other | Admitting: Family Medicine

## 2019-09-02 ENCOUNTER — Other Ambulatory Visit: Payer: Self-pay

## 2019-09-02 DIAGNOSIS — E668 Other obesity: Secondary | ICD-10-CM | POA: Diagnosis not present

## 2019-09-02 DIAGNOSIS — E782 Mixed hyperlipidemia: Secondary | ICD-10-CM | POA: Diagnosis not present

## 2019-09-02 DIAGNOSIS — M7702 Medial epicondylitis, left elbow: Secondary | ICD-10-CM

## 2019-09-02 DIAGNOSIS — M5136 Other intervertebral disc degeneration, lumbar region: Secondary | ICD-10-CM

## 2019-09-02 NOTE — Progress Notes (Signed)
Name: Ashley Herring   MRN: [id number]    DOB: Jan 01, 1981   Date:09/02/2019       Progress Note  Subjective  Chief Complaint  Chief Complaint  Patient presents with  . Hyperlipidemia    I connected with  [name]  on 09/02/19 at  1:00 PM EST by a video enabled telemedicine application and verified that I am speaking with the correct person using two identifiers.  I discussed the limitations of evaluation and management by telemedicine and the availability of in person appointments. The patient expressed understanding and agreed to proceed. Staff also discussed with the patient that there may be a patient responsible charge related to this service. Patient Location: Home Provider Location: Office Additional Individuals present: None  HPI   Pregnancy: She is about 6 months pregnant, seeing [facility].  She is considered high risk due to history miscarriages and morbid obesity.  She is still having nausea and vomiting. She is taking zofran, taking protonix, all with only minimal relief.  HLD/Obesity: She has not been able to eat a lot of vegetables because of her nausea.  She is eating in smaller portions.  She has been taking a fiber supplement, but sometimes cannot tolerate.  She walks a lot at work, but otherwise no formal exercise.   DDD Lumbar Spine: Has not had any issues since becoming pregnant.  She denies weakness/numbness/tingling in the legs.  Tylenol PRN.  Hand tingling: When she holds her phone, repetitive motions, resting her elbow on something hard like the mid-car console. Has history carpal tunnel and tennis elbow in the past - thinks she is having a flare.  No weakness.  Patient Active Problem List   Diagnosis Date Noted  . Nipple problem 07/10/2019  . Nausea and vomiting during pregnancy 06/25/2019  . Supervision of high risk pregnancy, antepartum 04/24/2019  . Obesity affecting pregnancy, antepartum 04/24/2019  . BMI 50.0-59.9, adult (HCC) 04/24/2019  .  Hyperlipidemia 04/04/2019  . Elevated erythrocyte sedimentation rate 03/25/2019  . Scleritis of left eye 03/25/2019  . 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 Breast lump 01/31/2019  . DDD (degenerative disc disease), lumbar 01/31/2019  . Chronic left-sided low back pain with left-sided sciatica 01/31/2019  . Extreme obesity 01/31/2019    Past Surgical History:  Procedure Laterality Date  . HEMORRHOID SURGERY      Family History  Problem Relation Age of Onset  . Hypertension Mother   . Diabetes Mother   . Hypertension Father   . Diabetes Father   . Asthma Daughter   . Breast cancer Paternal Aunt 30       Deceased from metastasis  . Cancer Maternal Grandmother   . Brain cancer Maternal Grandmother   . Brain cancer Paternal Grandmother   . Colon cancer Paternal Grandfather     Social History   Socioeconomic History  . Marital status: Married    Spouse name: Dennard Nip  . Number of children: 2  . Years of education: Not on file  . Highest education level: Not on file  Occupational History  . Not on file  Tobacco Use  . Smoking status: Former Smoker    Quit date: 01/31/2016    Years since quitting: 3.5  . Smokeless tobacco: Never Used  Substance and Sexual Activity  . Alcohol use: No  . Drug use: Never  . Sexual activity: Yes    Partners: Male    Birth control/protection: None  Other Topics Concern  . Not on file  Social History Narrative   Have 2 children from previous relationships now married and they are trying to conceive    + POC urine preg 04/17/2019   Social Determinants of Health   Financial Resource Strain: Low Risk   . Difficulty of Paying Living Expenses: Not hard at all  Food Insecurity: No Food Insecurity  . Worried About Charity fundraiser in the Last Year: Never true  . Ran Out of Food in the Last Year: Never true  Transportation Needs: No Transportation Needs  . Lack of Transportation (Medical): No  . Lack of Transportation (Non-Medical): No   Physical Activity: Sufficiently Active  . Days of Exercise per Week: 7 days  . Minutes of Exercise per Session: 40 min  Stress: Stress Concern Present  . Feeling of Stress : To some extent  Social Connections: Not Isolated  . Frequency of Communication with Friends and Family: More than three times a week  . Frequency of Social Gatherings with Friends and Family: More than three times a week  . Attends Religious Services: 1 to 4 times per year  . Active Member of Clubs or Organizations: Not on file  . Attends Archivist Meetings: 1 to 4 times per year  . Marital Status: Married  Human resources officer Violence: Not At Risk  . Fear of Current or Ex-Partner: No  . Emotionally Abused: No  . Physically Abused: No  . Sexually Abused: No     Current Outpatient Medications:  .  Doxylamine-Pyridoxine (DICLEGIS) 10-10 MG TBEC, Take 2 tablets by mouth at bedtime. If symptoms persist, add one tablet in the morning and one in the afternoon, Disp: 100 tablet, Rfl: 5 .  ondansetron (ZOFRAN) 4 MG tablet, Take 1 tablet (4 mg total) by mouth every 6 (six) hours as needed for nausea or vomiting., Disp: 20 tablet, Rfl: 1 .  pantoprazole (PROTONIX) 40 MG tablet, Take 1 tablet (40 mg total) by mouth daily., Disp: 30 tablet, Rfl: 3 .  Prenatal Vit-Fe Fumarate-FA (PRENATAL VITAMINS PO), Take 1 tablet by mouth 1 day or 1 dose., Disp: , Rfl:   Allergies  Allergen Reactions  . Peanuts [Peanut Oil] Swelling    I personally reviewed active problem list, medication list, allergies, notes from last encounter, lab results with the patient/caregiver today.   ROS  Ten systems reviewed and is negative except as mentioned in HPI  Objective  Virtual encounter, vitals not obtained.  There is no height or weight on file to calculate BMI.  Physical Exam  Constitutional: Patient appears well-developed and well-nourished. No distress.  HENT: Head: Normocephalic and atraumatic.  Neck: Normal range of  motion. Pulmonary/Chest: Effort normal. No respiratory distress. Speaking in complete sentences Neurological: Pt is alert and oriented to person, place, and time. Coordination, speech are normal.  Psychiatric: Patient has a normal mood and affect. behavior is normal. Judgment and thought content normal.   No results found for this or any previous visit (from the past 72 hour(s)).  PHQ2/9: Depression screen [facility] 2/9 09/02/2019 04/17/2019 04/04/2019 01/31/2019  Decreased Interest 0 0 0 0  Down, Depressed, Hopeless 0 0 0 0  PHQ - 2 Score 0 0 0 0  Altered sleeping 0 0 0 3  Tired, decreased energy 0 0 0 3  Change in appetite 0 0 0 1  Feeling bad or failure about yourself  0 0 0 0  Trouble concentrating 0 0 0 0  Moving slowly or fidgety/restless 0 0 0 0  Suicidal  thoughts 0 0 0 0  PHQ-9 Score 0 0 0 7  Difficult doing work/chores - Not difficult at all Not difficult at all Not difficult at all   PHQ-2/9 Result is negative.    Fall Risk: Fall Risk  09/02/2019 07/10/2019 04/17/2019 04/04/2019 01/31/2019  Falls in the past year? 0 0 0 0 0  Number falls in past yr: 0 0 0 0 0  Injury with Fall? 0 0 0 0 0  Follow up - - - - Falls evaluation completed     Assessment & Plan  1. Mixed hyperlipidemia - We will plan to recheck in about 6 months after she gives birth.  2. Extreme obesity - Discussed importance of 150 minutes of physical activity weekly, eat two servings of fish weekly, eat one serving of tree nuts ( cashews, pistachios, pecans, almonds.Marland Kitchen.) every other day, eat 6 servings of fruit/vegetables daily and drink plenty of water and avoid sweet beverages.   3. DDD (degenerative disc disease), lumbar - No issues right now.  4. Medial epicondylitis of left elbow - Avoid positioning that causes symptoms.  Ice area if needed.   I discussed the assessment and treatment plan with the patient. The patient was provided an opportunity to ask questions and all were answered. The patient agreed  with the plan and demonstrated an understanding of the instructions.  The patient was advised to call back or seek an in-person evaluation if the symptoms worsen or if the condition fails to improve as anticipated.  I provided 20 minutes of non-face-to-face time during this encounter.

## 2019-09-05 NOTE — L&D Delivery Note (Signed)
Date of delivery: 12/07/19 Estimated Date of Delivery: 12/24/19 Patient's last menstrual period was 03/10/2019 (exact date). EGA: [redacted]w[redacted]d  Delivery Note At 5:58 AM a viable female was delivered via Vaginal, Spontaneous  Presentation: OA, ROA.  APGAR: 8, 9; weight: 3150 g, 6#15oz    Placenta status: Spontaneous, Intact.  Cord: 3 vessels with the following complications: None.  Cord pH: NA  Called to see patient for presenting membranes. Patient had just had epidural placed.  Mom pushed 1 time to deliver a viable female infant.  The head followed by shoulders, which delivered without difficulty, and the rest of the body.  Nuchal cord and body cord noted and reduced on perineum.  Baby to mom's chest.  Cord clamped and cut after 6 min delay.  No cord blood obtained.  Placenta delivered spontaneously, intact, with a 3-vessel cord.   All counts correct.  Hemostasis obtained with IV pitocin and fundal massage.     Anesthesia: Epidural Episiotomy: None Lacerations: None Suture Repair: NA Est. Blood Loss (mL): 710  Mom to postpartum after 24 hours IV magnesium sulfate.   Baby to Couplet care / Skin to Skin.  Tresea Mall, CNM 12/07/2019, 6:56 AM

## 2019-09-19 ENCOUNTER — Other Ambulatory Visit: Payer: Self-pay

## 2019-09-19 ENCOUNTER — Encounter: Payer: Self-pay | Admitting: Obstetrics & Gynecology

## 2019-09-19 ENCOUNTER — Ambulatory Visit (INDEPENDENT_AMBULATORY_CARE_PROVIDER_SITE_OTHER): Payer: Medicaid Other | Admitting: Obstetrics & Gynecology

## 2019-09-19 VITALS — BP 128/80 | Wt 356.0 lb

## 2019-09-19 DIAGNOSIS — O09522 Supervision of elderly multigravida, second trimester: Secondary | ICD-10-CM

## 2019-09-19 DIAGNOSIS — O99212 Obesity complicating pregnancy, second trimester: Secondary | ICD-10-CM

## 2019-09-19 DIAGNOSIS — O09521 Supervision of elderly multigravida, first trimester: Secondary | ICD-10-CM

## 2019-09-19 DIAGNOSIS — O0992 Supervision of high risk pregnancy, unspecified, second trimester: Secondary | ICD-10-CM

## 2019-09-19 DIAGNOSIS — O99210 Obesity complicating pregnancy, unspecified trimester: Secondary | ICD-10-CM

## 2019-09-19 DIAGNOSIS — O0990 Supervision of high risk pregnancy, unspecified, unspecified trimester: Secondary | ICD-10-CM

## 2019-09-19 DIAGNOSIS — Z3A26 26 weeks gestation of pregnancy: Secondary | ICD-10-CM

## 2019-09-19 LAB — POCT URINALYSIS DIPSTICK OB: Glucose, UA: NEGATIVE

## 2019-09-19 NOTE — Patient Instructions (Signed)

## 2019-09-19 NOTE — Addendum Note (Signed)
Addended by: Cornelius Moras D on: 09/19/2019 04:02 PM   Modules accepted: Orders

## 2019-09-19 NOTE — Progress Notes (Signed)
  Subjective  Fetal Movement? yes Contractions? no Leaking Fluid? no Vaginal Bleeding? no  Objective  BP 128/80   Wt (!) 356 lb (161.5 kg)   LMP 03/10/2019 (Exact Date)   BMI 56.60 kg/m  General: NAD Pumonary: no increased work of breathing Abdomen: gravid, non-tender Extremities: no edema Psychiatric: mood appropriate, affect full  Assessment  39 y.o. [id number] at [redacted]w[redacted]d by  12/24/2019, by Ultrasound presenting for routine prenatal visit  Plan   Problem List Items Addressed This Visit      Other   Supervision of high risk pregnancy, antepartum   Obesity affecting pregnancy, antepartum   Relevant Orders   US OB Follow Up    Other Visit Diagnoses    [redacted] weeks gestation of pregnancy    -  Primary   Multigravida of advanced maternal age in first trimester          pregnancy Problems (from 04/24/19 to present)    Problem Noted Resolved   Supervision of high risk pregnancy, antepartum 04/24/2019 by Tresea Mall, CNM No   Overview Addendum 08/22/2019  4:07 PM by Vena Austria, MD    Clinic [facility] Prenatal Labs  Dating 6 week [place] Blood type: A/Positive/-- (09/25 1539)   Genetic Screen NIPS: normal XY Antibody:Negative (09/25 1539)  Anatomic [place] Complete and normal 08/22/2019 Rubella: 10.20 (09/25 1539) Varicella: Immune  GTT Early: 119 Third trimester:  RPR: Non Reactive (09/25 1539)   Rhogam N/A HBsAg: Negative (09/25 1539)   TDaP vaccine         Flu Shot: considering HIV: Non Reactive (09/25 1539)   Baby Food Considering breast                GBS:   Contraception  Pap: 2020 negative  CBB     CS/VBAC    Support Person Husband Dennard Nip          Obesity affecting pregnancy, antepartum 04/24/2019 by Tresea Mall, CNM No   Overview Signed 05/01/2019  3:14 PM by Oswaldo Conroy, CNM    BMI >=40 [x ] early 1h gtt - nml  [x ] u/s for dating [x ]  [x ] nutritional goals [x ] folic acid 1mg  [x ] bASA (>12 weeks) [x ] consider nutrition consult [x ] Growth u/s 50 [  ], 32 [ ] , 36 weeks [ ]  [x ] NST/AFI weekly 36+ weeks (36[] , 37[] , 38[] , 39[] , 40[] ) [x ] IOL by 41 weeks (scheduled, prn [] )        Colace for constipation  26 nv growth  Glucola nv  PNV  [facility]  Delivery concerns addressed.  Last pregnancy 13 years ago, 6 lbs, prior pregnancy 16 yrs ago 5 lbs.      Different paternity, may be larger baby, still would have vag labor and probable delivery  Prefers Stadol in labor.  Positions and labor pattern discussed  , MD, Ob/Gyn, [facility] 09/19/2019  3:58 PM

## 2019-10-03 ENCOUNTER — Other Ambulatory Visit: Payer: Self-pay

## 2019-10-03 ENCOUNTER — Ambulatory Visit (INDEPENDENT_AMBULATORY_CARE_PROVIDER_SITE_OTHER): Payer: Medicaid Other | Admitting: Obstetrics and Gynecology

## 2019-10-03 ENCOUNTER — Ambulatory Visit (INDEPENDENT_AMBULATORY_CARE_PROVIDER_SITE_OTHER): Payer: Medicaid Other

## 2019-10-03 ENCOUNTER — Other Ambulatory Visit: Payer: Medicaid Other

## 2019-10-03 VITALS — BP 126/88 | Wt 347.0 lb

## 2019-10-03 DIAGNOSIS — O99213 Obesity complicating pregnancy, third trimester: Secondary | ICD-10-CM

## 2019-10-03 DIAGNOSIS — O99210 Obesity complicating pregnancy, unspecified trimester: Secondary | ICD-10-CM

## 2019-10-03 DIAGNOSIS — O0993 Supervision of high risk pregnancy, unspecified, third trimester: Secondary | ICD-10-CM

## 2019-10-03 DIAGNOSIS — Z3A28 28 weeks gestation of pregnancy: Secondary | ICD-10-CM

## 2019-10-03 DIAGNOSIS — O0990 Supervision of high risk pregnancy, unspecified, unspecified trimester: Secondary | ICD-10-CM

## 2019-10-03 NOTE — Progress Notes (Signed)
ROB  28 week labs ultrasound

## 2019-10-03 NOTE — Progress Notes (Signed)
Routine Prenatal Care Visit  Subjective  Ashley Herring is a 39 y.o. [id number] at [redacted]w[redacted]d being seen today for ongoing prenatal care.  She is currently monitored for the following issues for this low-risk pregnancy and has Breast lump; DDD (degenerative disc disease), lumbar; Chronic left-sided low back pain with left-sided sciatica; Extreme obesity; Elevated erythrocyte sedimentation rate; Scleritis of left eye; Spondylosis of lumbar joint; Hyperlipidemia; Supervision of high risk pregnancy, antepartum; Obesity affecting pregnancy, antepartum; BMI 50.0-59.9, adult ([place]); Nausea and vomiting during pregnancy; and Nipple problem on their problem list.  ----------------------------------------------------------------------------------- Patient reports no complaints.   Contractions: Irritability. Vag. Bleeding: None.  Movement: Present. Denies leaking of fluid.  ----------------------------------------------------------------------------------- The following portions of the patient's history were reviewed and updated as appropriate: allergies, current medications, past family history, past medical history, past social history, past surgical history and problem list. Problem list updated.   Objective  Blood pressure 126/88, weight (!) 347 lb (157.4 kg), last menstrual period 03/10/2019. Pregravid weight 364 lb (165.1 kg) Total Weight Gain -17 lb (-7.711 kg) Urinalysis:      Fetal Status: Fetal Heart Rate (bpm): 145   Movement: Present     General:  Alert, oriented and cooperative. Patient is in no acute distress.  Skin: Skin is warm and dry. No rash noted.   Cardiovascular: Normal heart rate noted  Respiratory: Normal respiratory effort, no problems with respiration noted  Abdomen: Soft, gravid, appropriate for gestational age. Pain/Pressure: Present     Pelvic:  Cervical exam deferred        Extremities: Normal range of motion.     ental Status: Normal mood and affect. Normal behavior.  Normal judgment and thought content.   US OB Follow Up  Result Date: 10/03/2019 Patient Name: Ashley Herring DOB: [birth year] MRN: [id number] ULTRASOUND REPORT Location: [facility] Date of Service: 10/03/2019 Indications:growth/afi Findings: Nelda [place] intrauterine pregnancy is visualized with FHR at 136 BPM. Biometrics give an (U/S) Gestational age of [redacted]w[redacted]d and an (U/S) EDD of 12/08/2019; this correlates with the clinically established Estimated Date of Delivery: 12/24/19. Fetal presentation is Cephalic. Placenta: posterior. Grade: 1 AFI: 10.4 cm Growth percentile is 82.8%.  AC percentile is 97.5%. EFW: 1574 g  ( 3 lb 8 oz ) Impression: 1. [redacted]w[redacted]d Viable Singleton Intrauterine pregnancy previously established criteria. 2. Growth is 82.8 %ile.  AFI is 10.4 cm. Recommendations: 1.Clinical correlation with the patient's History and Physical Exam. Gweneth Dimitri, RT There is a singleton gestation with normal amniotic fluid volume. The fetal biometry correlates with established dating, there is AC acceleration present on today's scan.  Limited fetal anatomy was performed.The visualized fetal anatomical survey appears within normal limits within the resolution of ultrasound as described above.  It must be noted that a normal ultrasound is unable to rule out fetal aneuploidy.  Malachy Mood, MD, [facility], [facility] 10/03/2019, 4:05 PM     Assessment   40 y.o. [id number] at [redacted]w[redacted]d by  12/24/2019, by Ultrasound presenting for routine prenatal visit  Plan   pregnancy Problems (from 04/24/19 to present)    Problem Noted Resolved   Supervision of high risk pregnancy, antepartum 04/24/2019 by Rod Can, CNM No   Overview Addendum 08/22/2019  4:07 PM by Malachy Mood, [place] Prenatal Labs  Dating 6 week [place] Blood type: A/Positive/-- (09/25 1539)   Genetic Screen NIPS: normal XY Antibody:Negative (09/25 1539)  Anatomic [place] Complete and normal 08/22/2019 Rubella:  10.20 (09/25 1539) Varicella:  Immune  GTT Early: 119 Third trimester:  RPR: Non Reactive (09/25 1539)   Rhogam N/A HBsAg: Negative (09/25 1539)   TDaP vaccine         Flu Shot: considering HIV: Non Reactive (09/25 1539)   Baby Food Considering breast                GBS:   Contraception  Pap: 2020 negative  CBB     CS/VBAC    Support Person Husband Dennard Nip          Obesity affecting pregnancy, antepartum 04/24/2019 by Tresea Mall, CNM No   Overview Signed 05/01/2019  3:14 PM by Oswaldo Conroy, CNM    BMI >=40 [ ]  early 1h gtt -  [ ]  u/s for dating [ ]   [ ]  nutritional goals [ ]  folic acid 1mg  [ ]  bASA (>12 weeks) [ ]  consider nutrition consult [ ]  consider maternal EKG 1st trimester [ ]  Growth u/s 28 [ ] , 32 [ ] , 36 weeks [ ]  [ ]  NST/AFI weekly 36+ weeks (36[] , 37[] , 38[] , 39[] , 40[] ) [ ]  IOL by 41 weeks (scheduled, prn [] )          Gestational age appropriate obstetric precautions including but not limited to vaginal bleeding, contractions, leaking of fluid and fetal movement were reviewed in detail with the patient.    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  , MD, OB/GYN, [facility] 10/03/2019, 4:51 PM

## 2019-10-04 LAB — 28 WEEK RH+PANEL
Basophils Absolute: 0 10*3/uL (ref 0.0–0.2)
Basos: 0 %
EOS (ABSOLUTE): 0.1 10*3/uL (ref 0.0–0.4)
Eos: 1 %
Gestational Diabetes Screen: 104 mg/dL (ref 65–139)
HIV Screen 4th Generation wRfx: NONREACTIVE
Hematocrit: 29.8 % — ABNORMAL LOW (ref 34.0–46.6)
Hemoglobin: 10.2 g/dL — ABNORMAL LOW (ref 11.1–15.9)
Immature Grans (Abs): 0 10*3/uL (ref 0.0–0.1)
Immature Granulocytes: 0 %
Lymphocytes Absolute: 2.1 10*3/uL (ref 0.7–3.1)
Lymphs: 21 %
MCH: 29.3 pg (ref 26.6–33.0)
MCHC: 34.2 g/dL (ref 31.5–35.7)
MCV: 86 fL (ref 79–97)
Monocytes Absolute: 0.6 10*3/uL (ref 0.1–0.9)
Monocytes: 6 %
Neutrophils Absolute: 7.2 10*3/uL — ABNORMAL HIGH (ref 1.4–7.0)
Neutrophils: 72 %
Platelets: 389 10*3/uL (ref 150–450)
RBC: 3.48 x10E6/uL — ABNORMAL LOW (ref 3.77–5.28)
RDW: 13.8 % (ref 11.7–15.4)
RPR Ser Ql: NONREACTIVE
WBC: 9.9 10*3/uL (ref 3.4–10.8)

## 2019-10-05 ENCOUNTER — Encounter: Payer: Self-pay | Admitting: Obstetrics and Gynecology

## 2019-10-05 ENCOUNTER — Other Ambulatory Visit: Payer: Self-pay | Admitting: Obstetrics and Gynecology

## 2019-10-05 DIAGNOSIS — O99019 Anemia complicating pregnancy, unspecified trimester: Secondary | ICD-10-CM | POA: Insufficient documentation

## 2019-10-05 MED ORDER — FERROUS SULFATE 325 (65 FE) MG PO TABS: 325.0000 mg | ORAL_TABLET | Freq: Every day | ORAL | 6 refills | Status: DC

## 2019-10-16 ENCOUNTER — Encounter: Payer: Self-pay | Admitting: Obstetrics and Gynecology

## 2019-10-16 ENCOUNTER — Ambulatory Visit (INDEPENDENT_AMBULATORY_CARE_PROVIDER_SITE_OTHER): Payer: Medicaid Other | Admitting: Obstetrics and Gynecology

## 2019-10-16 ENCOUNTER — Other Ambulatory Visit: Payer: Self-pay

## 2019-10-16 VITALS — BP 137/71 | Wt 348.0 lb

## 2019-10-16 DIAGNOSIS — Z3A30 30 weeks gestation of pregnancy: Secondary | ICD-10-CM

## 2019-10-16 DIAGNOSIS — O0990 Supervision of high risk pregnancy, unspecified, unspecified trimester: Secondary | ICD-10-CM

## 2019-10-16 DIAGNOSIS — O0993 Supervision of high risk pregnancy, unspecified, third trimester: Secondary | ICD-10-CM

## 2019-10-16 DIAGNOSIS — O99013 Anemia complicating pregnancy, third trimester: Secondary | ICD-10-CM

## 2019-10-16 DIAGNOSIS — Z6841 Body Mass Index (BMI) 40.0 and over, adult: Secondary | ICD-10-CM

## 2019-10-16 DIAGNOSIS — Z23 Encounter for immunization: Secondary | ICD-10-CM | POA: Diagnosis not present

## 2019-10-16 DIAGNOSIS — O99213 Obesity complicating pregnancy, third trimester: Secondary | ICD-10-CM

## 2019-10-16 DIAGNOSIS — O99210 Obesity complicating pregnancy, unspecified trimester: Secondary | ICD-10-CM

## 2019-10-16 LAB — POCT URINALYSIS DIPSTICK OB: Glucose, UA: NEGATIVE

## 2019-10-16 NOTE — Progress Notes (Signed)
Routine Prenatal Care Visit  Subjective  Ashley Herring is a 39 y.o. [id number] at [redacted]w[redacted]d being seen today for ongoing prenatal care.  She is currently monitored for the following issues for this high-risk pregnancy and has Breast lump; DDD (degenerative disc disease), lumbar; Chronic left-sided low back pain with left-sided sciatica; Extreme obesity; Elevated erythrocyte sedimentation rate; Scleritis of left eye; Spondylosis of lumbar joint; Hyperlipidemia; Supervision of high risk pregnancy, antepartum; Obesity affecting pregnancy, antepartum; BMI 50.0-59.9, adult ([facility]); Nausea and vomiting during pregnancy; Nipple problem; and Anemia affecting pregnancy on their problem list.  ----------------------------------------------------------------------------------- Patient reports no complaints.   Contractions: Irritability. Vag. Bleeding: None.  Movement: Present. Leaking Fluid denies.  ----------------------------------------------------------------------------------- The following portions of the patient's history were reviewed and updated as appropriate: allergies, current medications, past family history, past medical history, past social history, past surgical history and problem list. Problem list updated.  Objective  Blood pressure 137/71, weight (!) 348 lb (157.9 kg), last menstrual period 03/10/2019. Pregravid weight 364 lb (165.1 kg) Total Weight Gain -16 lb (-7.258 kg) Urinalysis: Urine Protein Small (1+)  Urine Glucose Negative  Fetal Status: Fetal Heart Rate (bpm): 135   Movement: Present     General:  Alert, oriented and cooperative. Patient is in no acute distress.  Skin: Skin is warm and dry. No rash noted.   Cardiovascular: Normal heart rate noted  Respiratory: Normal respiratory effort, no problems with respiration noted  Abdomen: Soft, gravid, appropriate for gestational age. Pain/Pressure: Present     Pelvic:  Cervical exam deferred        Extremities: Normal range of  motion.  Edema: None  Mental Status: Normal mood and affect. Normal behavior. Normal judgment and thought content.   Assessment   39 y.o. [id number] at [redacted]w[redacted]d by  12/24/2019, by Ultrasound presenting for routine prenatal visit  Plan   pregnancy Problems (from 04/24/19 to present)    Problem Noted Resolved   Anemia affecting pregnancy 10/05/2019 by Vena Austria, MD No   Supervision of high risk pregnancy, antepartum 04/24/2019 by Tresea Mall, CNM No   Overview Addendum 10/05/2019  9:30 AM by Vena Austria, MD    Clinic [facility] Prenatal Labs  Dating 6 week [place] Blood type: A/Positive/-- (09/25 1539)   Genetic Screen NIPS: normal XY Antibody:Negative (09/25 1539)  Anatomic [place] Complete and normal 08/22/2019 Rubella: 10.20 (09/25 1539) Varicella: Immune  GTT Early: 119 Third trimester: 104 RPR: Non Reactive (09/25 1539)   Rhogam N/A HBsAg: Negative (09/25 1539)   TDaP vaccine         Flu Shot: considering HIV: Non Reactive (09/25 1539)   Baby Food Considering breast                GBS:   Contraception  Pap: 2020 negative  CBB     CS/VBAC    Support Person Husband Dennard Nip          Obesity affecting pregnancy, antepartum 04/24/2019 by Tresea Mall, CNM No   Overview Signed 05/01/2019  3:14 PM by Oswaldo Conroy, CNM    BMI >=40 [ ]  early 1h gtt -  [ ]  u/s for dating [ ]   [ ]  nutritional goals [ ]  folic acid 1mg  [ ]  bASA (>12 weeks) [ ]  consider nutrition consult [ ]  consider maternal EKG 1st trimester [ ]  Growth u/s 28 [ ] , 32 [ ] , 36 weeks [ ]  [ ]  NST/AFI weekly 36+ weeks (36[] , 37[] , 38[] , 39[] , 40[] ) [ ]  IOL by 41 weeks (scheduled,  prn [] )          Preterm labor symptoms and general obstetric precautions including but not limited to vaginal bleeding, contractions, leaking of fluid and fetal movement were reviewed in detail with the patient. Please refer to After Visit Summary for other counseling recommendations.   - TDaP today - Anesthesia consult referral  sent  Return in about 2 weeks (around 10/30/2019) for U/S for growth/AFI and routine prenatal.  Prentice Docker, MD, [place], [facility] 10/16/2019 5:05 PM

## 2019-10-30 ENCOUNTER — Ambulatory Visit (INDEPENDENT_AMBULATORY_CARE_PROVIDER_SITE_OTHER): Payer: Medicaid Other

## 2019-10-30 ENCOUNTER — Ambulatory Visit (INDEPENDENT_AMBULATORY_CARE_PROVIDER_SITE_OTHER): Payer: Medicaid Other | Admitting: Obstetrics and Gynecology

## 2019-10-30 ENCOUNTER — Other Ambulatory Visit: Payer: Self-pay

## 2019-10-30 VITALS — BP 114/70 | Wt 349.0 lb

## 2019-10-30 DIAGNOSIS — O99213 Obesity complicating pregnancy, third trimester: Secondary | ICD-10-CM

## 2019-10-30 DIAGNOSIS — M5442 Lumbago with sciatica, left side: Secondary | ICD-10-CM

## 2019-10-30 DIAGNOSIS — Z362 Encounter for other antenatal screening follow-up: Secondary | ICD-10-CM | POA: Diagnosis not present

## 2019-10-30 DIAGNOSIS — O99013 Anemia complicating pregnancy, third trimester: Secondary | ICD-10-CM

## 2019-10-30 DIAGNOSIS — G8929 Other chronic pain: Secondary | ICD-10-CM

## 2019-10-30 DIAGNOSIS — Z3A32 32 weeks gestation of pregnancy: Secondary | ICD-10-CM

## 2019-10-30 DIAGNOSIS — Z3A33 33 weeks gestation of pregnancy: Secondary | ICD-10-CM

## 2019-10-30 DIAGNOSIS — O0990 Supervision of high risk pregnancy, unspecified, unspecified trimester: Secondary | ICD-10-CM

## 2019-10-30 DIAGNOSIS — E668 Other obesity: Secondary | ICD-10-CM

## 2019-10-30 DIAGNOSIS — O99210 Obesity complicating pregnancy, unspecified trimester: Secondary | ICD-10-CM

## 2019-10-30 DIAGNOSIS — Z6841 Body Mass Index (BMI) 40.0 and over, adult: Secondary | ICD-10-CM

## 2019-10-30 DIAGNOSIS — O09893 Supervision of other high risk pregnancies, third trimester: Secondary | ICD-10-CM

## 2019-10-30 NOTE — Progress Notes (Signed)
ROB/US C/p pelvic pain, sharp pain on left side with certain movements  Denies lof, no vb, Good FM

## 2019-10-30 NOTE — Progress Notes (Signed)
Routine Prenatal Care Visit  Subjective  Ashley Herring is a 39 y.o. [id number] at [redacted]w[redacted]d being seen today for ongoing prenatal care.  She is currently monitored for the following issues for this high-risk pregnancy and has Breast lump; DDD (degenerative disc disease), lumbar; Chronic left-sided low back pain with left-sided sciatica; Extreme obesity; Elevated erythrocyte sedimentation rate; Scleritis of left eye; Spondylosis of lumbar joint; Hyperlipidemia; Supervision of high risk pregnancy, antepartum; Obesity affecting pregnancy, antepartum; BMI 50.0-59.9, adult ([facility]); Nausea and vomiting during pregnancy; Nipple problem; and Anemia affecting pregnancy on their problem list.  ----------------------------------------------------------------------------------- Patient reports noted some more sharp interment left sided pain lower abdominal pain, positional..   Contractions: Irritability. Vag. Bleeding: None.  Movement: Present. Denies leaking of fluid.  ----------------------------------------------------------------------------------- The following portions of the patient's history were reviewed and updated as appropriate: allergies, current medications, past family history, past medical history, past social history, past surgical history and problem list. Problem list updated.   Objective  Blood pressure 114/70, weight (!) 349 lb (158.3 kg), last menstrual period 03/10/2019. Pregravid weight 364 lb (165.1 kg) Total Weight Gain -15 lb (-6.804 kg) Urinalysis:      Fetal Status: Fetal Heart Rate (bpm): 135 Fundal Height: 36 cm Movement: Present     General:  Alert, oriented and cooperative. Patient is in no acute distress.  Skin: Skin is warm and dry. No rash noted.   Cardiovascular: Normal heart rate noted  Respiratory: Normal respiratory effort, no problems with respiration noted  Abdomen: Soft, gravid, appropriate for gestational age. Pain/Pressure: Present     Pelvic:  Cervical exam  deferred        Extremities: Normal range of motion.     ental Status: Normal mood and affect. Normal behavior. Normal judgment and thought content.   US OB Follow Up  Result Date: 10/30/2019 Patient Name: Ashley Herring DOB: 1981-04-14 MRN: [id number] ULTRASOUND REPORT Location: [facility] Date of Service: 10/30/2019 Indications:growth/afi Findings: [place] intrauterine pregnancy is visualized with FHR at 132 BPM. Biometrics give an (U/S) Gestational age of [redacted]w[redacted]d and an (U/S) EDD of 12/12/2019; this correlates with the clinically established Estimated Date of Delivery: 12/24/19. Fetal presentation is Cephalic. Placenta: posterior. Grade: 2 AFI: 12.7 cm Growth percentile is 74%.  AC percentile is 97.2%. EFW: 2331 g  ( 5 lb 2 oz) Impression: 1. [redacted]w[redacted]d Viable Singleton Intrauterine pregnancy previously established criteria. 2. Growth is 74 %ile.  AFI is 12.7 cm. Recommendations: 1.Clinical correlation with the patient's History and Physical Exam. Deanna Artis, RT There is a singleton gestation with normal amniotic fluid volume. The fetal biometry correlates with established dating, see slight AC acceleration on today's study.  Limited fetal anatomy was performed.The visualized fetal anatomical survey appears within normal limits within the resolution of ultrasound as described above.  It must be noted that a normal ultrasound is unable to rule out fetal aneuploidy.  Vena Austria, MD, [facility], [facility] 10/30/2019, 4:39 PM   US OB Follow Up  Result Date: 10/03/2019 Patient Name: Ashley Herring DOB: 01-24-1981 MRN: [id number] ULTRASOUND REPORT Location: [facility] Date of Service: 10/03/2019 Indications:growth/afi Findings: [place] intrauterine pregnancy is visualized with FHR at 136 BPM. Biometrics give an (U/S) Gestational age of [redacted]w[redacted]d and an (U/S) EDD of 12/08/2019; this correlates with the clinically established Estimated Date of Delivery: 12/24/19. Fetal  presentation is Cephalic. Placenta: posterior. Grade: 1 AFI: 10.4 cm Growth percentile is 82.8%.  AC percentile is 97.5%. EFW: 1574 g  ( 3  lb 8 oz ) Impression: 1. [redacted]w[redacted]d Viable Singleton Intrauterine pregnancy previously established criteria. 2. Growth is 82.8 %ile.  AFI is 10.4 cm. Recommendations: 1.Clinical correlation with the patient's History and Physical Exam. Gweneth Dimitri, RT There is a singleton gestation with normal amniotic fluid volume. The fetal biometry correlates with established dating, there is AC acceleration present on today's scan.  Limited fetal anatomy was performed.The visualized fetal anatomical survey appears within normal limits within the resolution of ultrasound as described above.  It must be noted that a normal ultrasound is unable to rule out fetal aneuploidy.  Malachy Mood, MD, [facility], [facility] 10/03/2019, 4:05 PM     Assessment   39 y.o. [id number] at [redacted]w[redacted]d by  12/24/2019, by Ultrasound presenting for routine prenatal visit  Plan   pregnancy Problems (from 04/24/19 to present)    Problem Noted Resolved   Anemia affecting pregnancy 10/05/2019 by Malachy Mood, MD No   Supervision of high risk pregnancy, antepartum 04/24/2019 by Rod Can, CNM No   Overview Addendum 10/05/2019  9:30 AM by Malachy Mood, [facility] Prenatal Labs  Dating 6 week [place] Blood type: A/Positive/-- (09/25 1539)   Genetic Screen NIPS: normal XY Antibody:Negative (09/25 1539)  Anatomic [place] Complete and normal 08/22/2019 Rubella: 10.20 (09/25 1539) Varicella: Immune  GTT Early: 119 Third trimester: 104 RPR: Non Reactive (09/25 1539)   Rhogam N/A HBsAg: Negative (09/25 1539)   TDaP vaccine         Flu Shot: considering HIV: Non Reactive (09/25 1539)   Baby Food Considering breast                GBS:   Contraception  Pap: 2020 negative  CBB     CS/VBAC    Support Person Husband Cornelia Copa          Obesity affecting pregnancy,  antepartum 04/24/2019 by Rod Can, CNM No   Overview Signed 05/01/2019  3:14 PM by Rexene Agent, CNM    BMI >=40 [ ]  early 1h gtt -  [ ]  u/s for dating [ ]   [ ]  nutritional goals [ ]  folic acid 1mg  [ ]  bASA (>12 weeks) [ ]  consider nutrition consult [ ]  consider maternal EKG 1st trimester [ ]  Growth u/s 28 [ ] , 32 [ ] , 36 weeks [ ]  [ ]  NST/AFI weekly 36+ weeks (36[] , 37[] , 38[] , 39[] , 40[] ) [ ]  IOL by 41 weeks (scheduled, prn [] )          Gestational age appropriate obstetric precautions including but not limited to vaginal bleeding, contractions, leaking of fluid and fetal movement were reviewed in detail with the patient.    Return in about 2 weeks (around 11/13/2019) for [place].  Malachy Mood, MD, [facility], [facility] 10/30/2019, 4:53 PM

## 2019-11-10 ENCOUNTER — Other Ambulatory Visit: Payer: Self-pay

## 2019-11-10 ENCOUNTER — Encounter
Admission: RE | Admit: 2019-11-10 | Discharge: 2019-11-10 | Disposition: A | Discharge status: Home / Self Care | Payer: Medicaid Other | Source: Ambulatory Visit | Attending: Anesthesiology | Admitting: Anesthesiology

## 2019-11-10 NOTE — Consult Note (Signed)
[facility] Anesthesia Consultation  Ashley Herring WER:[id number] DOB: [birth year] DOA: 11/10/2019 PCP: Ashley Hartshorn, FNP   Requesting physician: Dr. Georgianne Fick Date of consultation: 11/10/19 Reason for consultation: Obesity during pregnancy  CHIEF COMPLAINT:  Obesity during pregnancy  HISTORY OF PRESENT ILLNESS: Ashley Herring  is a 39 y.o. female with a known history of obesity in pregnancy. This is her 3rd pregnancy, 2 prior vaginal deliveries (most recent was 13 yrs ago). Has been diagnosed with asthma but doesn't have an inhaler. Denies hx of cardiovascular disease. Denies personal or family hx of bleeding disorders.   PAST MEDICAL HISTORY:   Past Medical History:  Diagnosis Date  . Back pain   . Eye inflammation   . Miscarriage   . Sciatica     PAST SURGICAL HISTORY:  Past Surgical History:  Procedure Laterality Date  . HEMORRHOID SURGERY      SOCIAL HISTORY:  Social History   Tobacco Use  . Smoking status: Former Smoker    Quit date: 01/31/2016    Years since quitting: 3.7  . Smokeless tobacco: Never Used  Substance Use Topics  . Alcohol use: No    FAMILY HISTORY:  Family History  Problem Relation Age of Onset  . Hypertension Mother   . Diabetes Mother   . Hypertension Father   . Diabetes Father   . Asthma Daughter   . Breast cancer Paternal Aunt 61       Deceased from metastasis  . Cancer Maternal Grandmother   . Brain cancer Maternal Grandmother   . Brain cancer Paternal Grandmother   . Colon cancer Paternal Grandfather     DRUG ALLERGIES:  Allergies  Allergen Reactions  . Peanuts [Peanut Oil] Swelling    REVIEW OF SYSTEMS:   RESPIRATORY: No cough, shortness of breath, wheezing.  CARDIOVASCULAR: No chest pain, orthopnea, edema.  HEMATOLOGY: No anemia, easy bruising or bleeding SKIN: No rash or lesion. NEUROLOGIC: No tingling, numbness, weakness.  PSYCHIATRY: No anxiety or depression.   MEDICATIONS AT  HOME:  Prior to Admission medications   Medication Sig Start Date End Date Taking? Authorizing Provider  Doxylamine-Pyridoxine (DICLEGIS) 10-10 MG TBEC Take 2 tablets by mouth at bedtime. If symptoms persist, add one tablet in the morning and one in the afternoon 05/28/19   Rexene Agent, CNM  ferrous sulfate (FERROUSUL) 325 (65 FE) MG tablet Take 1 tablet (325 mg total) by mouth daily with breakfast. 10/05/19   Malachy Mood, MD  ondansetron (ZOFRAN) 4 MG tablet Take 1 tablet (4 mg total) by mouth every 6 (six) hours as needed for nausea or vomiting. 07/07/19   Rexene Agent, CNM  pantoprazole (PROTONIX) 40 MG tablet Take 1 tablet (40 mg total) by mouth daily. 07/07/19   Rexene Agent, CNM  Prenatal Vit-Fe Fumarate-FA (PRENATAL VITAMINS PO) Take 1 tablet by mouth 1 day or 1 dose.    [provider]      PHYSICAL EXAMINATION:   VITAL SIGNS: Last menstrual period 03/10/2019.  GENERAL:  39 y.o.-year-old patient no acute distress.  HEENT: Head atraumatic, normocephalic. Oropharynx and nasopharynx clear. MP 2, TM distance >3 cm, normal mouth opening. LUNGS: Normal breath sounds bilaterally, no wheezing, rales,rhonchi. No use of accessory muscles of respiration.  CARDIOVASCULAR: S1, S2 normal. No murmurs, rubs, or gallops.  EXTREMITIES: No pedal edema, cyanosis, or clubbing.  NEUROLOGIC: normal gait PSYCHIATRIC: The patient is alert and oriented x 3.  SKIN: No obvious rash, lesion, or ulcer.    IMPRESSION  AND PLAN:   [name]  is a 39 y.o. female presenting with obesity during pregnancy. BMI is currently 56 at [redacted] weeks gestation. Planning for NCB or stadol for pain management during labor.   Airway exam reassuring. Significant lower back adiposity, spinal interspaces palpable around L1/L2.   We discussed analgesic options during labor including epidural analgesia. Discussed that in obesity there can be increased difficulty with epidural placement or even  failure of successful epidural. We also discussed that even after successful epidural placement there is increased risk of catheter migration out of the epidural space that would require catheter replacement. Discussed use of epidural vs spinal vs GA if cesarean delivery is required. Discussed increased risk of difficult intubation during pregnancy should an emergency cesarean delivery be required.   Plan for delivery at [facility].

## 2019-11-13 ENCOUNTER — Encounter: Payer: Self-pay | Admitting: Obstetrics & Gynecology

## 2019-11-13 ENCOUNTER — Other Ambulatory Visit: Payer: Self-pay

## 2019-11-13 ENCOUNTER — Ambulatory Visit (INDEPENDENT_AMBULATORY_CARE_PROVIDER_SITE_OTHER): Payer: Medicaid Other | Admitting: Obstetrics & Gynecology

## 2019-11-13 VITALS — BP 120/80 | Wt 349.0 lb

## 2019-11-13 DIAGNOSIS — O09523 Supervision of elderly multigravida, third trimester: Secondary | ICD-10-CM

## 2019-11-13 DIAGNOSIS — O0990 Supervision of high risk pregnancy, unspecified, unspecified trimester: Secondary | ICD-10-CM

## 2019-11-13 DIAGNOSIS — Z3A34 34 weeks gestation of pregnancy: Secondary | ICD-10-CM

## 2019-11-13 DIAGNOSIS — O09521 Supervision of elderly multigravida, first trimester: Secondary | ICD-10-CM

## 2019-11-13 DIAGNOSIS — O99210 Obesity complicating pregnancy, unspecified trimester: Secondary | ICD-10-CM

## 2019-11-13 DIAGNOSIS — O0993 Supervision of high risk pregnancy, unspecified, third trimester: Secondary | ICD-10-CM

## 2019-11-13 DIAGNOSIS — O99213 Obesity complicating pregnancy, third trimester: Secondary | ICD-10-CM

## 2019-11-13 LAB — POCT URINALYSIS DIPSTICK OB: Glucose, UA: NEGATIVE

## 2019-11-13 NOTE — Addendum Note (Signed)
Addended by: Cornelius Moras D on: 11/13/2019 04:29 PM   Modules accepted: Orders

## 2019-11-13 NOTE — Progress Notes (Signed)
  Subjective  Fetal Movement? yes Contractions? No, but occas BHs Leaking Fluid? no Vaginal Bleeding? no Some nausea Objective  BP 120/80   Wt (!) 349 lb (158.3 kg)   LMP 03/10/2019 (Exact Date)   BMI 55.49 kg/m  General: NAD Pumonary: no increased work of breathing Abdomen: gravid, non-tender Extremities: no edema Psychiatric: mood appropriate, affect full  Assessment  39 y.o. [id number] at [redacted]w[redacted]d by  12/24/2019, by Ultrasound presenting for routine prenatal visit  Plan   Problem List Items Addressed This Visit      Other   Supervision of high risk pregnancy, antepartum   Obesity affecting pregnancy, antepartum   Relevant Orders   US OB Limited    Other Visit Diagnoses    Multigravida of advanced maternal age in first trimester    -  Primary   [redacted] weeks gestation of pregnancy          pregnancy Problems (from 04/24/19 to present)    Problem Noted Resolved   Anemia affecting pregnancy 10/05/2019 by Vena Austria, MD No   Supervision of high risk pregnancy, antepartum 04/24/2019 by Tresea Mall, CNM No   Overview Addendum 11/13/2019  4:14 PM by Nadara Mustard, MD    Clinic [facility] Prenatal Labs  Dating 6 week [place] Blood type: A/Positive/-- (09/25 1539)   Genetic Screen NIPS: normal XY Antibody:Negative (09/25 1539)  Anatomic [place] Complete and normal 08/22/2019 Rubella: 10.20 (09/25 1539) Varicella: Immune  GTT Early: 119 Third trimester: 104 RPR: Non Reactive (09/25 1539)   Rhogam N/A HBsAg: Negative (09/25 1539)   TDaP vaccine         Flu Shot: considering HIV: Non Reactive (09/25 1539)   Baby Food Considering breast                GBS:   Contraception  Pap: 2020 negative  CBB  No   CS/VBAC n/a   Support Person Husband Dennard Nip          Obesity affecting pregnancy, antepartum 04/24/2019 by Tresea Mall, CNM No   Overview Addendum 11/13/2019  4:15 PM by Nadara Mustard, MD    BMI >=40 [x ] early 1h gtt - nml [x ] u/s for dating [ x]  [x ] nutritional  goals [x ] folic acid 1mg  [x ] bASA (>12 weeks) [ ]  consider nutrition consult [ ]  consider maternal EKG 1st trimester [x ] Growth u/s 60 [x ], 56 [x ], 36 weeks [ ]  [x ] NST/AFI weekly 36+ weeks (36[] , 37[] , 38[] , 39[] , 40[] ) [ ]  IOL by 41 weeks (scheduled, prn [] )        Discussed APT starting 36 weeks  , MD, [facility], [facility] 11/13/2019  4:26 PM

## 2019-11-13 NOTE — Patient Instructions (Signed)
Braxton Hicks Contractions °Contractions of the uterus can occur throughout pregnancy, but they are not always a sign that you are in labor. You may have practice contractions called Braxton Hicks contractions. These false labor contractions are sometimes confused with true labor. °What are Braxton Hicks contractions? °Braxton Hicks contractions are tightening movements that occur in the muscles of the uterus before labor. Unlike true labor contractions, these contractions do not result in opening (dilation) and thinning of the cervix. Toward the end of pregnancy (32-34 weeks), Braxton Hicks contractions can happen more often and may become stronger. These contractions are sometimes difficult to tell apart from true labor because they can be very uncomfortable. You should not feel embarrassed if you go to the hospital with false labor. °Sometimes, the only way to tell if you are in true labor is for your health care provider to look for changes in the cervix. The health care provider will do a physical exam and may monitor your contractions. If you are not in true labor, the exam should show that your cervix is not dilating and your water has not broken. °If there are no other health problems associated with your pregnancy, it is completely safe for you to be sent home with false labor. You may continue to have Braxton Hicks contractions until you go into true labor. °How to tell the difference between true labor and false labor °True labor °· Contractions last 30-70 seconds. °· Contractions become very regular. °· Discomfort is usually felt in the top of the uterus, and it spreads to the lower abdomen and low back. °· Contractions do not go away with walking. °· Contractions usually become more intense and increase in frequency. °· The cervix dilates and gets thinner. °False labor °· Contractions are usually shorter and not as strong as true labor contractions. °· Contractions are usually irregular. °· Contractions  are often felt in the front of the lower abdomen and in the groin. °· Contractions may go away when you walk around or change positions while lying down. °· Contractions get weaker and are shorter-lasting as time goes on. °· The cervix usually does not dilate or become thin. °Follow these instructions at home: ° °· Take over-the-counter and prescription medicines only as told by your health care provider. °· Keep up with your usual exercises and follow other instructions from your health care provider. °· Eat and drink lightly if you think you are going into labor. °· If Braxton Hicks contractions are making you uncomfortable: °? Change your position from lying down or resting to walking, or change from walking to resting. °? Sit and rest in a tub of warm water. °? Drink enough fluid to keep your urine pale yellow. Dehydration may cause these contractions. °? Do slow and deep breathing several times an hour. °· Keep all follow-up prenatal visits as told by your health care provider. This is important. °Contact a health care provider if: °· You have a fever. °· You have continuous pain in your abdomen. °Get help right away if: °· Your contractions become stronger, more regular, and closer together. °· You have fluid leaking or gushing from your vagina. °· You pass blood-tinged mucus (bloody show). °· You have bleeding from your vagina. °· You have low back pain that you never had before. °· You feel your baby’s head pushing down and causing pelvic pressure. °· Your baby is not moving inside you as much as it used to. °Summary °· Contractions that occur before labor are   called Braxton Hicks contractions, false labor, or practice contractions. °· Braxton Hicks contractions are usually shorter, weaker, farther apart, and less regular than true labor contractions. True labor contractions usually become progressively stronger and regular, and they become more frequent. °· Manage discomfort from Braxton Hicks contractions  by changing position, resting in a warm bath, drinking plenty of water, or practicing deep breathing. °This information is not intended to replace advice given to you by your health care provider. Make sure you discuss any questions you have with your health care provider. °Document Revised: 08/03/2017 Document Reviewed: 01/04/2017 °Elsevier Patient Education © 2020 Elsevier Inc. ° °

## 2019-11-25 ENCOUNTER — Other Ambulatory Visit: Payer: Self-pay | Admitting: Obstetrics & Gynecology

## 2019-11-25 DIAGNOSIS — O99210 Obesity complicating pregnancy, unspecified trimester: Secondary | ICD-10-CM

## 2019-11-28 ENCOUNTER — Other Ambulatory Visit: Payer: Self-pay | Admitting: Obstetrics & Gynecology

## 2019-11-28 ENCOUNTER — Ambulatory Visit (INDEPENDENT_AMBULATORY_CARE_PROVIDER_SITE_OTHER): Payer: Medicaid Other | Admitting: Obstetrics and Gynecology

## 2019-11-28 ENCOUNTER — Other Ambulatory Visit: Payer: Self-pay

## 2019-11-28 ENCOUNTER — Ambulatory Visit (INDEPENDENT_AMBULATORY_CARE_PROVIDER_SITE_OTHER): Payer: Medicaid Other

## 2019-11-28 VITALS — BP 126/82 | Wt 352.0 lb

## 2019-11-28 DIAGNOSIS — O99210 Obesity complicating pregnancy, unspecified trimester: Secondary | ICD-10-CM

## 2019-11-28 DIAGNOSIS — Z362 Encounter for other antenatal screening follow-up: Secondary | ICD-10-CM

## 2019-11-28 DIAGNOSIS — Z3A36 36 weeks gestation of pregnancy: Secondary | ICD-10-CM

## 2019-11-28 DIAGNOSIS — O99013 Anemia complicating pregnancy, third trimester: Secondary | ICD-10-CM

## 2019-11-28 DIAGNOSIS — O99213 Obesity complicating pregnancy, third trimester: Secondary | ICD-10-CM | POA: Diagnosis not present

## 2019-11-28 DIAGNOSIS — O0990 Supervision of high risk pregnancy, unspecified, unspecified trimester: Secondary | ICD-10-CM

## 2019-11-28 DIAGNOSIS — Z3685 Encounter for antenatal screening for Streptococcus B: Secondary | ICD-10-CM

## 2019-11-28 DIAGNOSIS — O09893 Supervision of other high risk pregnancies, third trimester: Secondary | ICD-10-CM

## 2019-11-28 NOTE — Progress Notes (Signed)
ROB Growth/NST GBS

## 2019-11-28 NOTE — Progress Notes (Signed)
Routine Prenatal Care Visit  Subjective  Dorothea Yow is a 39 y.o. T5H7416 at [redacted]w[redacted]d being seen today for ongoing prenatal care.  She is currently monitored for the following issues for this high-risk pregnancy and has Breast lump; DDD (degenerative disc disease), lumbar; Chronic left-sided low back pain with left-sided sciatica; Extreme obesity; Elevated erythrocyte sedimentation rate; Scleritis of left eye; Spondylosis of lumbar joint; Hyperlipidemia; Supervision of high risk pregnancy, antepartum; Obesity affecting pregnancy, antepartum; BMI 50.0-59.9, adult ([place]); Nausea and vomiting during pregnancy; Nipple problem; and Anemia affecting pregnancy on their problem list.  ----------------------------------------------------------------------------------- Patient reports no complaints.   Contractions: Irregular. Vag. Bleeding: None.  Movement: Present. Denies leaking of fluid.  ----------------------------------------------------------------------------------- The following portions of the patient's history were reviewed and updated as appropriate: allergies, current medications, past family history, past medical history, past social history, past surgical history and problem list. Problem list updated.   Objective  Weight (!) 352 lb (159.7 kg), last menstrual period 03/10/2019. Pregravid weight 364 lb (165.1 kg) Total Weight Gain -12 lb (-5.443 kg) Urinalysis:      Fetal Status:     Movement: Present     General:  Alert, oriented and cooperative. Patient is in no acute distress.  Skin: Skin is warm and dry. No rash noted.   Cardiovascular: Normal heart rate noted  Respiratory: Normal respiratory effort, no problems with respiration noted  Abdomen: Soft, gravid, appropriate for gestational age. Pain/Pressure: Present     Pelvic:  Cervical exam deferred        Extremities: Normal range of motion.     ental Status: Normal mood and affect. Normal behavior. Normal judgment and thought  content.   Baseline: 140 Variability: moderate Accelerations: present Decelerations: absent Tocometry: N/A The patient was monitored for 30 minutes, fetal heart rate tracing was deemed reactive, category I tracing,  US OB Follow Up  Result Date: 11/28/2019 Patient Name: Royelle Hinchman DOB: [birth year] MRN: [id number] ULTRASOUND REPORT Location: [facility] Date of Service: 11/28/2019 Indications:growth/afi Findings: Nelda [place]se[place] intrauterine pregnancy is visualized with FHR at 138 BPM. Biometrics give an (U/S) Gestational age of [redacted]w[redacted]d and an (U/S) EDD of 12/20/2019; this correlates with the clinically established Estimated Date of Delivery: 12/24/19. Fetal presentation is Cephalic. Placenta: posterior. Grade: 2 AFI: 16.4 cm Growth percentile is 73.6%.  AC percentile is > 97.7%. EFW: 3236 g  ( 7 lb 2 oz ) Impression: 1. [redacted]w[redacted]d Viable Singleton Intrauterine pregnancy previously established criteria. 2. Growth is 73.6 %ile.  AFI is 16.4 cm. Recommendations: 1.Clinical correlation with the patient's History and Physical Exam. Gweneth Dimitri, RT There is a singleton gestation with normal amniotic fluid volume. The fetal biometry correlates with established dating, there is [facility] accleration noted.  Limited fetal anatomy was performed.The visualized fetal anatomical survey appears within normal limits within the resolution of ultrasound as described above.  It must be noted that a normal ultrasound is unable to rule out fetal aneuploidy.  Malachy Mood, MD, [facility], [facility] 11/28/2019, 4:00 PM     Assessment   39 y.o. E3O1224 at [redacted]w[redacted]d by  12/24/2019, by Ultrasound presenting for routine prenatal visit  Plan   pregnancy Problems (from 04/24/19 to present)    Problem Noted Resolved   Anemia affecting pregnancy 10/05/2019 by Malachy Mood, MD No   Supervision of high risk pregnancy, antepartum 04/24/2019 by Rod Can, CNM No   Overview Addendum 11/13/2019  4:14 PM  by Gae Dry, MD    Clinic  [facility] Prenatal Labs  Dating 6 week [place] Blood type: A/Positive/-- (09/25 1539)   Genetic Screen NIPS: normal XY Antibody:Negative (09/25 1539)  Anatomic [place] Complete and normal 08/22/2019 Rubella: 10.20 (09/25 1539) Varicella: Immune  GTT Early: 119 Third trimester: 104 RPR: Non Reactive (09/25 1539)   Rhogam N/A HBsAg: Negative (09/25 1539)   TDaP vaccine         Flu Shot: considering HIV: Non Reactive (09/25 1539)   Baby Food Considering breast                GBS:   Contraception  Pap: 2020 negative  CBB  No   CS/VBAC n/a   Support Person Husband Dennard Nip          Obesity affecting pregnancy, antepartum 04/24/2019 by Tresea Mall, CNM No   Overview Addendum 11/13/2019  4:15 PM by Nadara Mustard, MD    BMI >=40 [x ] early 1h gtt - nml [x ] u/s for dating [ x]  [x ] nutritional goals [x ] folic acid 1mg  [x ] bASA (>12 weeks) [ ]  consider nutrition consult [ ]  consider maternal EKG 1st trimester [x ] Growth u/s 29 [x ], 47 [x ], 36 weeks [ ]  [x ] NST/AFI weekly 36+ weeks (36[] , 37[] , 38[] , 39[] , 40[] ) [ ]  IOL by 41 weeks (scheduled, prn [] )          Gestational age appropriate obstetric precautions including but not limited to vaginal bleeding, contractions, leaking of fluid and fetal movement were reviewed in detail with the patient.    140, moderate+accels, no decels   Return in about 1 week (around 12/05/2019) for ROB, NST, AFI.  26, MD, [facility], [facility] 11/28/2019, 4:18 PM

## 2019-11-30 LAB — STREP GP B NAA: Strep Gp B NAA: POSITIVE — AB

## 2019-12-01 ENCOUNTER — Encounter: Payer: Self-pay | Admitting: Obstetrics and Gynecology

## 2019-12-01 DIAGNOSIS — B951 Streptococcus, group B, as the cause of diseases classified elsewhere: Secondary | ICD-10-CM | POA: Insufficient documentation

## 2019-12-03 ENCOUNTER — Ambulatory Visit (INDEPENDENT_AMBULATORY_CARE_PROVIDER_SITE_OTHER): Payer: Medicaid Other

## 2019-12-03 ENCOUNTER — Other Ambulatory Visit: Payer: Self-pay

## 2019-12-03 ENCOUNTER — Encounter: Payer: Self-pay | Admitting: Obstetrics & Gynecology

## 2019-12-03 ENCOUNTER — Ambulatory Visit (INDEPENDENT_AMBULATORY_CARE_PROVIDER_SITE_OTHER): Payer: Medicaid Other | Admitting: Obstetrics & Gynecology

## 2019-12-03 VITALS — BP 122/80 | Wt 354.0 lb

## 2019-12-03 DIAGNOSIS — Z3689 Encounter for other specified antenatal screening: Secondary | ICD-10-CM | POA: Diagnosis not present

## 2019-12-03 DIAGNOSIS — O09523 Supervision of elderly multigravida, third trimester: Secondary | ICD-10-CM

## 2019-12-03 DIAGNOSIS — O99210 Obesity complicating pregnancy, unspecified trimester: Secondary | ICD-10-CM

## 2019-12-03 DIAGNOSIS — O99213 Obesity complicating pregnancy, third trimester: Secondary | ICD-10-CM

## 2019-12-03 DIAGNOSIS — Z3A37 37 weeks gestation of pregnancy: Secondary | ICD-10-CM | POA: Diagnosis not present

## 2019-12-03 DIAGNOSIS — O4103X0 Oligohydramnios, third trimester, not applicable or unspecified: Secondary | ICD-10-CM

## 2019-12-03 DIAGNOSIS — O0993 Supervision of high risk pregnancy, unspecified, third trimester: Secondary | ICD-10-CM

## 2019-12-03 NOTE — Patient Instructions (Signed)
Oligohydramnios Oligohydramnios is a condition in which there is not enough fluid in the sac (amniotic sac) that surrounds your unborn baby (fetus) in the uterus. The amniotic sac contains fluid (amniotic fluid) that:  Protects your baby from injury (trauma) and infections.  Helps your baby move freely inside the uterus.  Helps your baby's lungs, kidneys, and digestive system to develop. This condition can interfere with your baby's normal prenatal growth and development. It can happen any time during pregnancy but is most common during the last 3 months (third trimester). Oligohydramnios is most likely to cause serious problems when it occurs early in pregnancy. Some problems that can result from this condition include:  Early (premature) birth.  Birth defects.  Limited (restricted) fetal growth.  Decreased oxygen flow to the fetus due to pressure on the umbilical cord.  Pregnancy loss (miscarriage).  Stillbirth. What are the causes? This condition may be caused by:  A leak or tear in the amniotic sac.  A problem with the organ that nourishes the baby in the uterus (placenta), such as failure of the placenta to provide enough blood, fluid, and nutrients to the baby.  Having identical twins who share the same placenta.  A fetal birth defect. This is usually an abnormality in the fetal kidneys or urinary tract.  A pregnancy that goes 2 weeks or more past the due date.  A condition that the mother has, such as: ? A lack of fluids in the body (dehydration). ? High blood pressure. ? Diabetes. ? Reactions to certain medicines, such as ibuprofen or blood pressure medicines (ACE inhibitors). ? Systemic lupus. In some cases, the cause is not known. What increases the risk? This condition is more likely to develop if you:  Become dehydrated.  Have high blood pressure.  Take NSAIDs or ACE inhibitors.  Have diabetes or lupus.  Have poor prenatal care.  Have a clotting  disorder. What are the signs or symptoms? In most cases, there are no symptoms of oligohydramnios. If you do have symptoms, they may include:  Fluid leaking from the vagina.  Having a uterus that is smaller than normal.  Feeling less movement of your baby in your uterus. How is this diagnosed? This condition may be diagnosed by measuring the amount of amniotic fluid in your amniotic sac using the amniotic fluid index (AFI). The AFI is a measurement that is done during a prenatal ultrasound test that uses painless, harmless sound waves to create an image of your uterus and your baby. This prenatal ultrasound may be done to:  Measure the amniotic fluid level.  Check your baby's kidneys and your baby's growth.  Evaluate the placenta. You may also have other tests to find the cause of oligohydramnios. How is this treated? Treatment for this condition depends on how low your amniotic fluid level is, how far along you are in your pregnancy, and your overall health. Treatment may include:  Having your health care provider monitor your condition more closely than usual. You may have more frequent appointments and more AFI ultrasound measurements.  Increasing the amount of fluid in your body. This may be done by having you drink more fluids or by giving you fluids through an IV that is inserted into one of your veins.  Injecting fluid into your amniotic sac during delivery (amnioinfusion).  Having your baby delivered early, if you are close to your due date. Follow these instructions at home: Lifestyle  Do not drink alcohol. No amount of alcohol is  safe during pregnancy.  Do not use any products that contain nicotine or tobacco, such as cigarettes, e-cigarettes, and chewing tobacco. If you need help quitting, ask your health care provider.  Do not use any illegal drugs. These can harm your developing baby or cause a miscarriage. General instructions      Take over-the-counter and  prescription medicines only as told by your health care provider.  Follow instructions from your health care provider about physical activity and rest. Your health care provider may recommend that you stay in bed (be on bed rest).  Follow instructions from your health care provider about eating or drinking restrictions.  Eat healthy foods, including a balance of fruits and vegetables, lean proteins, whole grains, and low-fat or nonfat dairy products.  Drink enough fluid to keep your urine pale yellow.  Keep all prenatal care appointments with your health care provider. This is important. Contact a health care provider if:  You notice that your baby seems to be moving less than usual. Get help right away if:  You have fluid leaking from your vagina.  You start to have labor pains (contractions). This may feel like a sense of tightening in your lower abdomen.  You have a fever. Summary  Oligohydramnios is a condition in which there is not enough fluid in the amniotic sac that surrounds your unborn baby in the uterus. It can interfere with your baby's normal growth and development.  This condition is most common in the last 3 months of pregnancy but can occur at any time. If it occurs early in pregnancy, oligohydramnios can lead to serious problems.  Do not drink alcohol or use nicotine, tobacco, or illegal drugs.  Keep your regular prenatal appointments, eat healthy, and drink enough fluid to keep your urine pale yellow.  Contact your health care provider if you notice that your baby seems to be moving less than usual. Get help right away if you have fluid leaking from your vagina, you sense a tightening in your lower abdomen, or you have a fever. This information is not intended to replace advice given to you by your health care provider. Make sure you discuss any questions you have with your health care provider. Document Revised: 01/30/2018 Document Reviewed: 01/30/2018 Elsevier  Patient Education  2020 [employer].

## 2019-12-03 NOTE — Progress Notes (Signed)
  Subjective  Fetal Movement? yes Contractions? no Leaking Fluid? Yes - questionable Vaginal Bleeding? no  Objective  BP 122/80   Wt (!) 354 lb (160.6 kg)   LMP 03/10/2019 (Exact Date)   BMI 56.28 kg/m  General: NAD Pumonary: no increased work of breathing Abdomen: gravid, non-tender Extremities: no edema Psychiatric: mood appropriate, affect full SSE- no pooling SVE- Cl/Th/Hi  FERN NEG Assessment  38 y.o. [id number] at [redacted]w[redacted]d by  12/24/2019, by Ultrasound presenting for routine prenatal visit  Plan   Problem List Items Addressed This Visit      Other   Supervision of high risk pregnancy, antepartum   Obesity affecting pregnancy, antepartum    Other Visit Diagnoses    [redacted] weeks gestation of pregnancy    -  Primary   Multigravida of advanced maternal age in third trimester       Oligohydramnios in third trimester, single or unspecified fetus       Relevant Orders   US OB Limited      pregnancy Problems (from 04/24/19 to present)    Problem Noted Resolved   Positive GBS test 12/01/2019 by Vena Austria, MD No   Anemia affecting pregnancy 10/05/2019 by Vena Austria, MD No   Supervision of high risk pregnancy, antepartum 04/24/2019 by Tresea Mall, CNM No   Overview Addendum 12/01/2019  9:36 AM by Vena Austria, MD    Clinic [facility] Prenatal Labs  Dating 6 week [place] Blood type: A/Positive/-- (09/25 1539)   Genetic Screen NIPS: normal XY Antibody:Negative (09/25 1539)  Anatomic [place] Complete and normal 08/22/2019 Rubella: 10.20 (09/25 1539) Varicella: Immune  GTT Early: 119 Third trimester: 104 RPR: Non Reactive (09/25 1539)   Rhogam N/A HBsAg: Negative (09/25 1539)   TDaP vaccine         Flu Shot: considering HIV: Non Reactive (09/25 1539)   Baby Food Considering breast                GBS: positive  Contraception  Pap: 2020 negative  CBB  No   CS/VBAC n/a   Support Person Husband Dennard Nip          Obesity affecting pregnancy, antepartum 04/24/2019 by  Tresea Mall, CNM No   Overview Addendum 11/13/2019  4:15 PM by Nadara Mustard, MD    BMI >=40 [x ] early 1h gtt - nml [x ] u/s for dating [ x]  [x ] nutritional goals [x ] folic acid 1mg  [x ] bASA (>12 weeks) [ ]  consider nutrition consult [ ]  consider maternal EKG 1st trimester [x ] Growth u/s 18 [x ], 80 [x ], 36 weeks [ ]  [x ] NST/AFI weekly 36+ weeks (36[] , 37[] , 38[] , 39[] , 40[] ) [ ]  IOL by 41 weeks (scheduled, prn [] )        Review of ULTRASOUND.    I have personally reviewed images and report of recent ultrasound done at [facility].    Plan of management to be discussed with patient.  Repeat 26 in 48 hours due to AFI 5.5.  Pt to hydrate.  Manage decision for delivery based on results. Also repeat NST Friday. [facility] emphasized today Will plan for IOL at 39 weeks, unless need to sooner based on APT  , MD, Ob/Gyn, [facility] 12/03/2019  3:09 PM

## 2019-12-05 ENCOUNTER — Other Ambulatory Visit: Payer: Self-pay

## 2019-12-05 ENCOUNTER — Inpatient Hospital Stay
Admission: RE | Admit: 2019-12-05 | Discharge: 2019-12-09 | DRG: 807 | Disposition: A | Discharge status: Home / Self Care | Payer: Medicaid Other | Attending: Obstetrics and Gynecology | Admitting: Obstetrics and Gynecology

## 2019-12-05 ENCOUNTER — Ambulatory Visit
Admission: RE | Admit: 2019-12-05 | Discharge: 2019-12-05 | Disposition: A | Discharge status: Home / Self Care | Payer: Medicaid Other | Source: Ambulatory Visit | Attending: Obstetrics & Gynecology | Admitting: Obstetrics & Gynecology

## 2019-12-05 ENCOUNTER — Encounter: Payer: Self-pay | Admitting: Obstetrics and Gynecology

## 2019-12-05 ENCOUNTER — Inpatient Hospital Stay: Payer: Medicaid Other

## 2019-12-05 DIAGNOSIS — O99013 Anemia complicating pregnancy, third trimester: Secondary | ICD-10-CM

## 2019-12-05 DIAGNOSIS — O1414 Severe pre-eclampsia complicating childbirth: Secondary | ICD-10-CM | POA: Diagnosis present

## 2019-12-05 DIAGNOSIS — O99824 Streptococcus B carrier state complicating childbirth: Secondary | ICD-10-CM | POA: Diagnosis present

## 2019-12-05 DIAGNOSIS — Z3A37 37 weeks gestation of pregnancy: Secondary | ICD-10-CM

## 2019-12-05 DIAGNOSIS — O99214 Obesity complicating childbirth: Secondary | ICD-10-CM | POA: Diagnosis present

## 2019-12-05 DIAGNOSIS — D649 Anemia, unspecified: Secondary | ICD-10-CM | POA: Diagnosis present

## 2019-12-05 DIAGNOSIS — O99210 Obesity complicating pregnancy, unspecified trimester: Secondary | ICD-10-CM

## 2019-12-05 DIAGNOSIS — O0990 Supervision of high risk pregnancy, unspecified, unspecified trimester: Secondary | ICD-10-CM

## 2019-12-05 DIAGNOSIS — I10 Essential (primary) hypertension: Secondary | ICD-10-CM | POA: Diagnosis not present

## 2019-12-05 DIAGNOSIS — O9902 Anemia complicating childbirth: Secondary | ICD-10-CM | POA: Diagnosis present

## 2019-12-05 DIAGNOSIS — O4103X0 Oligohydramnios, third trimester, not applicable or unspecified: Secondary | ICD-10-CM | POA: Insufficient documentation

## 2019-12-05 DIAGNOSIS — O9942 Diseases of the circulatory system complicating childbirth: Secondary | ICD-10-CM | POA: Diagnosis present

## 2019-12-05 DIAGNOSIS — Z87891 Personal history of nicotine dependence: Secondary | ICD-10-CM | POA: Diagnosis not present

## 2019-12-05 DIAGNOSIS — I517 Cardiomegaly: Secondary | ICD-10-CM | POA: Diagnosis present

## 2019-12-05 DIAGNOSIS — O1413 Severe pre-eclampsia, third trimester: Secondary | ICD-10-CM | POA: Diagnosis not present

## 2019-12-05 DIAGNOSIS — Z20822 Contact with and (suspected) exposure to covid-19: Secondary | ICD-10-CM | POA: Diagnosis present

## 2019-12-05 DIAGNOSIS — B951 Streptococcus, group B, as the cause of diseases classified elsewhere: Secondary | ICD-10-CM

## 2019-12-05 LAB — COMPREHENSIVE METABOLIC PANEL
ALT: 11 U/L (ref 0–44)
AST: 18 U/L (ref 15–41)
Albumin: 3 g/dL — ABNORMAL LOW (ref 3.5–5.0)
Alkaline Phosphatase: 123 U/L (ref 38–126)
Anion gap: 9 (ref 5–15)
BUN: 8 mg/dL (ref 6–20)
CO2: 21 mmol/L — ABNORMAL LOW (ref 22–32)
Calcium: 8.6 mg/dL — ABNORMAL LOW (ref 8.9–10.3)
Chloride: 108 mmol/L (ref 98–111)
Creatinine, Ser: 0.52 mg/dL (ref 0.44–1.00)
GFR calc Af Amer: >60 mL/min (ref >60)
GFR calc non Af Amer: >60 mL/min (ref >60)
Glucose, Bld: 88 mg/dL (ref 70–99)
Potassium: 4 mmol/L (ref 3.5–5.1)
Sodium: 138 mmol/L (ref 135–145)
Total Bilirubin: 0.5 mg/dL (ref 0.3–1.2)
Total Protein: 6.4 g/dL — ABNORMAL LOW (ref 6.5–8.1)

## 2019-12-05 LAB — CBC
HCT: 29.7 % — ABNORMAL LOW (ref 36.0–46.0)
Hemoglobin: 9.7 g/dL — ABNORMAL LOW (ref 12.0–15.0)
MCH: 28.3 pg (ref 26.0–34.0)
MCHC: 32.7 g/dL (ref 30.0–36.0)
MCV: 86.6 fL (ref 80.0–100.0)
Platelets: 317 10*3/uL (ref 150–400)
RBC: 3.43 MIL/uL — ABNORMAL LOW (ref 3.87–5.11)
RDW: 13.5 % (ref 11.5–15.5)
WBC: 9.8 10*3/uL (ref 4.0–10.5)
nRBC: 0 % (ref 0.0–0.2)

## 2019-12-05 LAB — TYPE AND SCREEN
ABO/RH(D): A POS
Antibody Screen: NEGATIVE

## 2019-12-05 LAB — URINE DRUG SCREEN, QUALITATIVE (ARMC ONLY)
Amphetamines, Ur Screen: NOT DETECTED
Barbiturates, Ur Screen: NOT DETECTED
Benzodiazepine, Ur Scrn: NOT DETECTED
Cannabinoid 50 Ng, Ur ~~LOC~~: NOT DETECTED
Cocaine Metabolite,Ur ~~LOC~~: NOT DETECTED
MDMA (Ecstasy)Ur Screen: NOT DETECTED
Methadone Scn, Ur: NOT DETECTED
Opiate, Ur Screen: NOT DETECTED
Phencyclidine (PCP) Ur S: NOT DETECTED
Tricyclic, Ur Screen: NOT DETECTED

## 2019-12-05 LAB — RESPIRATORY PANEL BY RT PCR (FLU A&B, COVID)
Influenza A by PCR: NEGATIVE
Influenza B by PCR: NEGATIVE
SARS Coronavirus 2 by RT PCR: NEGATIVE

## 2019-12-05 LAB — PROTEIN / CREATININE RATIO, URINE
Creatinine, Urine: 270 mg/dL
Protein Creatinine Ratio: 0.1 mg/mg{Cre} (ref 0.00–0.15)
Total Protein, Urine: 27 mg/dL

## 2019-12-05 LAB — BRAIN NATRIURETIC PEPTIDE: B Natriuretic Peptide: 194 pg/mL — ABNORMAL HIGH (ref 0.0–100.0)

## 2019-12-05 MED ORDER — HYDRALAZINE HCL 20 MG/ML IJ SOLN: 10.0000 mg | INTRAMUSCULAR | Status: DC | PRN

## 2019-12-05 MED ORDER — OXYTOCIN BOLUS FROM INFUSION: 500.0000 mL | Freq: Once | INTRAVENOUS | Status: DC

## 2019-12-05 MED ORDER — CALCIUM GLUCONATE 10 % IV SOLN
INTRAVENOUS | Status: AC
  Filled 2019-12-05: qty 10

## 2019-12-05 MED ORDER — OXYTOCIN 40 UNITS IN NORMAL SALINE INFUSION - SIMPLE MED
1.0000 m[IU]/min | INTRAVENOUS | Status: DC
  Administered 2019-12-06: 1 m[IU]/min via INTRAVENOUS
  Filled 2019-12-05: qty 1000

## 2019-12-05 MED ORDER — SODIUM CHLORIDE 0.9 % IV SOLN
5.0000 10*6.[IU] | Freq: Once | INTRAVENOUS | Status: AC
  Administered 2019-12-05: 5 10*6.[IU] via INTRAVENOUS
  Filled 2019-12-05: qty 5

## 2019-12-05 MED ORDER — LABETALOL HCL 5 MG/ML IV SOLN
20.0000 mg | INTRAVENOUS | Status: DC | PRN
  Filled 2019-12-05: qty 4

## 2019-12-05 MED ORDER — LABETALOL HCL 5 MG/ML IV SOLN
20.0000 mg | INTRAVENOUS | Status: DC | PRN
  Administered 2019-12-05: 20 mg via INTRAVENOUS

## 2019-12-05 MED ORDER — BUTORPHANOL TARTRATE 1 MG/ML IJ SOLN
1.0000 mg | INTRAMUSCULAR | Status: DC | PRN
  Administered 2019-12-06: 1 mg via INTRAVENOUS
  Filled 2019-12-05 (×2): qty 1

## 2019-12-05 MED ORDER — PENICILLIN G 3 MILLION UNITS IVPB - SIMPLE MED
3.0000 10*6.[IU] | INTRAVENOUS | Status: DC
  Administered 2019-12-05 – 2019-12-07 (×7): 3 10*6.[IU] via INTRAVENOUS
  Filled 2019-12-05: qty 100
  Filled 2019-12-05: qty 3
  Filled 2019-12-05 (×6): qty 100

## 2019-12-05 MED ORDER — LABETALOL HCL 100 MG PO TABS
200.0000 mg | ORAL_TABLET | Freq: Once | ORAL | Status: AC
  Administered 2019-12-05: 200 mg via ORAL
  Filled 2019-12-05: qty 2

## 2019-12-05 MED ORDER — NIFEDIPINE 10 MG PO CAPS
10.0000 mg | ORAL_CAPSULE | ORAL | Status: DC | PRN
  Administered 2019-12-05 – 2019-12-07 (×2): 10 mg via ORAL
  Filled 2019-12-05 (×3): qty 1

## 2019-12-05 MED ORDER — ACETAMINOPHEN 325 MG PO TABS: 650.0000 mg | ORAL_TABLET | ORAL | Status: DC | PRN

## 2019-12-05 MED ORDER — LACTATED RINGERS IV SOLN: 500.0000 mL | INTRAVENOUS | Status: DC | PRN

## 2019-12-05 MED ORDER — NIFEDIPINE ER OSMOTIC RELEASE 30 MG PO TB24
60.0000 mg | ORAL_TABLET | Freq: Every day | ORAL | Status: DC
  Administered 2019-12-05: 60 mg via ORAL
  Filled 2019-12-05: qty 2

## 2019-12-05 MED ORDER — LACTATED RINGERS IV SOLN: INTRAVENOUS | Status: DC

## 2019-12-05 MED ORDER — SOD CITRATE-CITRIC ACID 500-334 MG/5ML PO SOLN: 30.0000 mL | ORAL | Status: DC | PRN

## 2019-12-05 MED ORDER — HYDRALAZINE HCL 20 MG/ML IJ SOLN
10.0000 mg | INTRAMUSCULAR | Status: DC | PRN
  Administered 2019-12-05 (×2): 10 mg via INTRAVENOUS
  Filled 2019-12-05 (×2): qty 1

## 2019-12-05 MED ORDER — LABETALOL HCL 5 MG/ML IV SOLN: 40.0000 mg | INTRAVENOUS | Status: DC | PRN

## 2019-12-05 MED ORDER — LABETALOL HCL 5 MG/ML IV SOLN
40.0000 mg | INTRAVENOUS | Status: DC | PRN
  Administered 2019-12-05: 40 mg via INTRAVENOUS
  Filled 2019-12-05: qty 8

## 2019-12-05 MED ORDER — HYDRALAZINE HCL 20 MG/ML IJ SOLN
10.0000 mg | INTRAMUSCULAR | Status: DC | PRN
  Filled 2019-12-05: qty 1

## 2019-12-05 MED ORDER — LIDOCAINE HCL (PF) 1 % IJ SOLN
30.0000 mL | INTRAMUSCULAR | Status: DC | PRN
  Filled 2019-12-05: qty 30

## 2019-12-05 MED ORDER — LABETALOL HCL 5 MG/ML IV SOLN
80.0000 mg | INTRAVENOUS | Status: DC | PRN
  Administered 2019-12-05: 80 mg via INTRAVENOUS
  Filled 2019-12-05: qty 16

## 2019-12-05 MED ORDER — ONDANSETRON HCL 4 MG/2ML IJ SOLN
4.0000 mg | Freq: Four times a day (QID) | INTRAMUSCULAR | Status: DC | PRN
  Administered 2019-12-06: 4 mg via INTRAVENOUS
  Filled 2019-12-05: qty 2

## 2019-12-05 MED ORDER — TERBUTALINE SULFATE 1 MG/ML IJ SOLN: 0.2500 mg | Freq: Once | INTRAMUSCULAR | Status: DC | PRN

## 2019-12-05 MED ORDER — AMMONIA AROMATIC IN INHA
RESPIRATORY_TRACT | Status: AC
  Filled 2019-12-05: qty 10

## 2019-12-05 MED ORDER — MAGNESIUM SULFATE 40 GM/1000ML IV SOLN
2.0000 g/h | INTRAVENOUS | Status: DC
  Administered 2019-12-05 – 2019-12-07 (×4): 2 g/h via INTRAVENOUS
  Filled 2019-12-05 (×3): qty 1000

## 2019-12-05 MED ORDER — MISOPROSTOL 25 MCG QUARTER TABLET
25.0000 ug | ORAL_TABLET | ORAL | Status: DC | PRN
  Administered 2019-12-05: 25 ug via VAGINAL
  Filled 2019-12-05: qty 1

## 2019-12-05 MED ORDER — MISOPROSTOL 200 MCG PO TABS
ORAL_TABLET | ORAL | Status: AC
  Filled 2019-12-05: qty 4

## 2019-12-05 MED ORDER — ACETAMINOPHEN 325 MG PO TABS
650.0000 mg | ORAL_TABLET | ORAL | Status: DC | PRN
  Administered 2019-12-05 – 2019-12-06 (×2): 650 mg via ORAL
  Filled 2019-12-05 (×2): qty 2

## 2019-12-05 MED ORDER — LABETALOL HCL 5 MG/ML IV SOLN: 80.0000 mg | INTRAVENOUS | Status: DC | PRN

## 2019-12-05 MED ORDER — OXYTOCIN 40 UNITS IN NORMAL SALINE INFUSION - SIMPLE MED
2.5000 [IU]/h | INTRAVENOUS | Status: DC
  Administered 2019-12-07: 2.5 [IU]/h via INTRAVENOUS
  Filled 2019-12-05 (×2): qty 1000

## 2019-12-05 MED ORDER — OXYTOCIN 10 UNIT/ML IJ SOLN
INTRAMUSCULAR | Status: AC
  Filled 2019-12-05: qty 2

## 2019-12-05 MED ORDER — MAGNESIUM SULFATE BOLUS VIA INFUSION
4.0000 g | Freq: Once | INTRAVENOUS | Status: AC
  Administered 2019-12-05: 4 g via INTRAVENOUS
  Filled 2019-12-05: qty 1000

## 2019-12-05 NOTE — Progress Notes (Signed)
Subjective:  Comfortable, still feeling contractions  Objective:   Vitals: Blood pressure (!) 151/81, pulse 92, temperature 98.4 F (36.9 C), temperature source Oral, resp. rate 16, height 5\' 6"  (1.676 m), weight (!) 160.6 kg, last menstrual period 03/10/2019. General: NAD Abdomen: gravid, non-tender Cervical Exam:  Dilation: 3 Effacement (%): 50 Cervical Position: Posterior Station: -3 Exam by:: Dencil Cayson  FHT: 125, moderate, +accels, no decels Toco: q1-24min  Results for orders placed or performed during the hospital encounter of 12/05/19 (from the past 24 hour(s))  CBC     Status: Abnormal   Collection Time: 12/05/19  3:19 PM  Result Value Ref Range   WBC 9.8 4.0 - 10.5 K/uL   RBC 3.43 (L) 3.87 - 5.11 MIL/uL   Hemoglobin 9.7 (L) 12.0 - 15.0 g/dL   HCT 02/04/20 (L) 76.5 - 46.5 %   MCV 86.6 80.0 - 100.0 fL   MCH 28.3 26.0 - 34.0 pg   MCHC 32.7 30.0 - 36.0 g/dL   RDW 03.5 46.5 - 68.1 %   Platelets 317 150 - 400 K/uL   nRBC 0.0 0.0 - 0.2 %  Comprehensive metabolic panel     Status: Abnormal   Collection Time: 12/05/19  3:19 PM  Result Value Ref Range   Sodium 138 135 - 145 mmol/L   Potassium 4.0 3.5 - 5.1 mmol/L   Chloride 108 98 - 111 mmol/L   CO2 21 (L) 22 - 32 mmol/L   Glucose, Bld 88 70 - 99 mg/dL   BUN 8 6 - 20 mg/dL   Creatinine, Ser 02/04/20 0.44 - 1.00 mg/dL   Calcium 8.6 (L) 8.9 - 10.3 mg/dL   Total Protein 6.4 (L) 6.5 - 8.1 g/dL   Albumin 3.0 (L) 3.5 - 5.0 g/dL   AST 18 15 - 41 U/L   ALT 11 0 - 44 U/L   Alkaline Phosphatase 123 38 - 126 U/L   Total Bilirubin 0.5 0.3 - 1.2 mg/dL   GFR calc non Af Amer >60 >60 mL/min   GFR calc Af Amer >60 >60 mL/min   Anion gap 9 5 - 15  Type and screen [facility]     Status: None   Collection Time: 12/05/19  3:25 PM  Result Value Ref Range   ABO/RH(D) A POS    Antibody Screen NEG    Sample Expiration      12/08/2019,2359 Performed at [facility], [address] [place], [place]  [place]   Protein / creatinine ratio, urine     Status: None   Collection Time: 12/05/19  3:41 PM  Result Value Ref Range   Creatinine, Urine 270 mg/dL   Total Protein, Urine 27 mg/dL   Protein Creatinine Ratio 0.10 0.00 - 0.15 mg/mg[Cre]  Respiratory Panel by RT PCR (Flu A&B, Covid) - Nasopharyngeal Swab     Status: None   Collection Time: 12/05/19  3:41 PM   Specimen: Nasopharyngeal Swab  Result Value Ref Range   SARS Coronavirus 2 by RT PCR NEGATIVE NEGATIVE   Influenza A by PCR NEGATIVE NEGATIVE   Influenza B by PCR NEGATIVE NEGATIVE  Urine Drug Screen, Qualitative ([facility] only)     Status: None   Collection Time: 12/05/19  8:09 PM  Result Value Ref Range   Tricyclic, Ur Screen NONE DETECTED NONE DETECTED   Amphetamines, Ur Screen NONE DETECTED NONE DETECTED   MDMA (Ecstasy)Ur Screen NONE DETECTED NONE DETECTED   Cocaine Metabolite,Ur [place] NONE DETECTED NONE DETECTED  Opiate, Ur Screen NONE DETECTED NONE DETECTED   Phencyclidine (PCP) Ur S NONE DETECTED NONE DETECTED   Cannabinoid 50 Ng, Ur [place] NONE DETECTED NONE DETECTED   Barbiturates, Ur Screen NONE DETECTED NONE DETECTED   Benzodiazepine, Ur Scrn NONE DETECTED NONE DETECTED   Methadone Scn, Ur NONE DETECTED NONE DETECTED  Brain natriuretic peptide     Status: Abnormal   Collection Time: 12/05/19  9:01 PM  Result Value Ref Range   B Natriuretic Peptide 194.0 (H) 0.0 - 100.0 pg/mL    Assessment:   39 y.o. [id number] [redacted]w[redacted]d IOL preeclampsia with severe features.  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  More anterior but still very high and not amenable to AROM  2) Fetus - cat I tracing  3) Hypertension - CXR with cadriomegaly, this seems to be chronic as it was mentioned on CT abdomen and pelvis in 2018 although not worked up to date.  The most likely etiology is undiagnosed HTN outside of pregnancy.  BNP was elevated at 194pg/mL.  She is  asymptomatic at present, is not showing any evidence of fluid retention, no signs of vascular congestion or pulmonary edema on CXR, -4kg this pregnancy, and normal renal function.  Case was also discussed with Dr. Annamaria Boots of MFM and he felt that the patient was relatively low risk given that she appears well compensated and agreed with the plan to move towards delivery.  Appreciate medicine input. - Maternal echocardiogram ordered - UDS negative - I&O being monitored given also on magnesium sulfate - Patient responded very well to fast acting nifedipine, will start on procardia XL 60mg  for maintenance   Malachy Mood, MD, [place], [facility] 12/05/2019, 10:23 PM

## 2019-12-05 NOTE — Consult Note (Signed)
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 female at [redacted] weeks gestation, admitted to the [facility] service for scheduled NST, who I was asked to see in consultation for management of elevated blood pressures with systolics in the 945O to 592T and as high as 190.  Patient denies symptoms except for mild headache.  She denies chest pain or shortness of breath.  She has only minimal swelling of the bilateral lower extremities, not unusual at her stage of pregnancy.  She denies a history of hypertension, coronary artery disease or congestive heart failure and denies a family history of the same..  She has had an uneventful pregnancy thus far and was normotensive throughout the prenatal course.  Lab work was done to evaluate for preeclampsia.  Apparently patient met preeclampsia criteria only on account of the degree of blood pressure elevation.  She was treated with several antihypertensives initially without adequate response.  She received labetalol 200 mg p.o. prior to IV access and subsequently received labetalol 20, 40 and then 80 mg IV.  Due to persistently elevated blood pressure she subsequently received hydralazine IV 10 mg followed by repeat dose.  She then received nifedipine p.o. 10 mg followed by another dose of 10 mg, to which she responded.  At the time of the consult blood pressure is 244/62 with systolic in the 863O to 177N.  She also received IV magnesium, 4g.  She continues to deny shortness of breath or chest pain and was headache free by the time of my assessment.   Review of Systems:  ROS As per HPI otherwise 10 point review of systems negative.   Past Medical History: Past Medical History:  Diagnosis  Date  . Back pain   . Eye inflammation   . Miscarriage   . Sciatica     Past Surgical History: Past Surgical History:  Procedure Laterality Date  . HEMORRHOID SURGERY       Allergies:   Allergies  Allergen Reactions  . Peanuts [Peanut Oil] Swelling     Social History:  reports that she quit smoking about 3 years ago. She has never used smokeless tobacco. She reports that she does not drink alcohol or use drugs.   Family History: Family History  Problem Relation Age of Onset  . Hypertension Mother   . Diabetes Mother   . Hypertension Father   . Diabetes Father   . Asthma Daughter   . Breast cancer Paternal Aunt 78       Deceased from metastasis  . Cancer Maternal Grandmother   . Brain cancer Maternal Grandmother   . Brain cancer Paternal Grandmother   . Colon cancer Paternal Grandfather     Unacceptable: Noncontributory, unremarkable, or negative. Acceptable: Family history reviewed and not pertinent (If you reviewed it)   Physical Exam: Vitals:   12/05/19 1933 12/05/19 1948 12/05/19 2004 12/05/19 2019  BP: (!) 161/80 (!) 161/81 139/64 138/67  Pulse: 90 89 75 79  Resp:      Temp:      TempSrc:      Weight:      Height:        Constitutional: Alert and awake, very pleasant,  oriented x3, not in any acute distress.  Speaks with ease and without dyspnea Eyes: PERLA, EOMI, irises appear normal, anicteric sclera,  ENMT: external ears and nose appear normal,   Lips appears normal, oropharynx mucosa, tongue, posterior pharynx appear normal  Neck: neck appears normal, no masses, normal ROM, no thyromegaly, no JVD  CVS: S1-S2 clear, no murmur rubs or gallops, trace/minimal LE edema, normal pedal pulses  Respiratory:  clear to auscultation bilaterally, no wheezing, rales or rhonchi. Respiratory effort normal. No accessory muscle use.  Abdomen: Pregnant abdomen of 37 weeks with no tenderness.  Normal bowel sounds. Musculoskeletal: : no cyanosis, clubbing or edema  noted bilaterally Neuro: Cranial nerves II-XII intact, strength, sensation, reflexes Psych: judgement and insight appear normal, stable mood and affect, mental status Skin: no rashes or lesions or ulcers, no induration or nodules    Data reviewed:  I have personally reviewed following labs and imaging studies Labs:  CBC: Recent Labs  Lab 12/05/19 1519  WBC 9.8  HGB 9.7*  HCT 29.7*  MCV 86.6  PLT 643    Basic Metabolic Panel: Recent Labs  Lab 12/05/19 1519  NA 138  K 4.0  CL 108  CO2 21*  GLUCOSE 88  BUN 8  CREATININE 0.52  CALCIUM 8.6*   GFR Estimated Creatinine Clearance: 151.7 mL/min (by C-G formula based on SCr of 0.52 mg/dL). Liver Function Tests: Recent Labs  Lab 12/05/19 1519  AST 18  ALT 11  ALKPHOS 123  BILITOT 0.5  PROT 6.4*  ALBUMIN 3.0*   No results for input(s): LIPASE, AMYLASE in the last 168 hours. No results for input(s): AMMONIA in the last 168 hours. Coagulation profile No results for input(s): INR, PROTIME in the last 168 hours.  Cardiac Enzymes: No results for input(s): CKTOTAL, CKMB, CKMBINDEX, TROPONINI in the last 168 hours. BNP: Invalid input(s): POCBNP CBG: No results for input(s): GLUCAP in the last 168 hours. D-Dimer No results for input(s): DDIMER in the last 72 hours. Hgb A1c No results for input(s): HGBA1C in the last 72 hours. Lipid Profile No results for input(s): CHOL, HDL, LDLCALC, TRIG, CHOLHDL, LDLDIRECT in the last 72 hours. Thyroid function studies No results for input(s): TSH, T4TOTAL, T3FREE, THYROIDAB in the last 72 hours.  Invalid input(s): FREET3 Anemia work up No results for input(s): VITAMINB12, FOLATE, FERRITIN, TIBC, IRON, RETICCTPCT in the last 72 hours. Urinalysis    Component Value Date/Time   COLORURINE YELLOW (A) 01/06/2018 2215   APPEARANCEUR HAZY (A) 01/06/2018 2215   LABSPEC 1.026 01/06/2018 2215   PHURINE 6.0 01/06/2018 2215   GLUCOSEU Negative 11/13/2019 1629   HGBUR NEGATIVE 01/06/2018  2215   [place] 01/06/2018 2215   KETONESUR NEGATIVE 01/06/2018 2215   PROTEINUR 30 (A) 01/06/2018 2215   NITRITE NEGATIVE 01/06/2018 2215   LEUKOCYTESUR NEGATIVE 01/06/2018 2215     Microbiology Recent Results (from the past 240 hour(s))  Strep Gp B NAA     Status: Abnormal   Collection Time: 11/28/19  4:22 PM   Specimen: Vaginal/Rectal; Sterile Swab   VR  Result Value Ref Range Status   Strep Gp B NAA Positive (A) Negative Final    Comment: Centers for Disease Control and Prevention (CDC) and American Congress of Obstetricians and Gynecologists (ACOG) guidelines for prevention of perinatal group B streptococcal (GBS) disease specify co-collection of a vaginal and rectal swab specimen to maximize sensitivity of GBS detection. Per the CDC and ACOG, swabbing both the lower vagina and rectum substantially increases the  yield of detection compared with sampling the vagina alone. Penicillin G, ampicillin, or cefazolin are indicated for intrapartum prophylaxis of perinatal GBS colonization. Reflex susceptibility testing should be performed prior to use of clindamycin only on GBS isolates from penicillin-allergic women who are considered a high risk for anaphylaxis. Treatment with vancomycin without additional testing is warranted if resistance to clindamycin is noted.   Respiratory Panel by RT PCR (Flu A&B, Covid) - Nasopharyngeal Swab     Status: None   Collection Time: 12/05/19  3:41 PM   Specimen: Nasopharyngeal Swab  Result Value Ref Range Status   SARS Coronavirus 2 by RT PCR NEGATIVE NEGATIVE Final    Comment: (NOTE) SARS-CoV-2 target nucleic acids are NOT DETECTED. The SARS-CoV-2 RNA is generally detectable in upper respiratoy specimens during the acute phase of infection. The lowest concentration of SARS-CoV-2 viral copies this assay can detect is 131 copies/mL. A negative result does not preclude SARS-Cov-2 infection and should not be used as the sole basis  for treatment or other patient management decisions. A negative result may occur with  improper specimen collection/handling, submission of specimen other than nasopharyngeal swab, presence of viral mutation(s) within the areas targeted by this assay, and inadequate number of viral copies (<131 copies/mL). A negative result must be combined with clinical observations, patient history, and epidemiological information. The expected result is Negative. Fact Sheet for Patients:  PinkCheek.be Fact Sheet for Healthcare Providers:  GravelBags.it This test is not yet ap proved or cleared by the [place] FDA and  has been authorized for detection and/or diagnosis of SARS-CoV-2 by FDA under an Emergency Use Authorization (EUA). This EUA will remain  in effect (meaning this test can be used) for the duration of the COVID-19 declaration under Section 564(b)(1) of the Act, 21 U.S.C. section 360bbb-3(b)(1), unless the authorization is terminated or revoked sooner.    Influenza A by PCR NEGATIVE NEGATIVE Final   Influenza B by PCR NEGATIVE NEGATIVE Final    Comment: (NOTE) The Xpert Xpress SARS-CoV-2/FLU/RSV assay is intended as an aid in  the diagnosis of influenza from Nasopharyngeal swab specimens and  should not be used as a sole basis for treatment. Nasal washings and  aspirates are unacceptable for Xpert Xpress SARS-CoV-2/FLU/RSV  testing. Fact Sheet for Patients: PinkCheek.be Fact Sheet for Healthcare Providers: GravelBags.it This test is not yet approved or cleared by the [place] FDA and  has been authorized for detection and/or diagnosis of SARS-CoV-2 by  FDA under an Emergency Use Authorization (EUA). This EUA will remain  in effect (meaning this test can be used) for the duration of the  Covid-19 declaration under Section 564(b)(1) of the Act, 21  U.S.C.  section 360bbb-3(b)(1), unless the authorization is  terminated or revoked. Performed at [facility], [address] [place], [place] [address]        Inpatient Medications:   Scheduled Meds: . ammonia      . calcium gluconate      . misoprostol      . oxytocin      . oxytocin 40 units in LR 1000 mL  500 mL Intravenous Once  . pencillin G potassium IV  3 Million Units Intravenous Q4H   Continuous Infusions: . lactated ringers    . lactated ringers 125 mL/hr at 12/05/19 1610  . magnesium sulfate 2 g/hr (12/05/19 1632)  . oxytocin       Radiological Exams on Admission: US OB Limited  Result Date: 12/05/2019 CLINICAL DATA:  Oligohydramnios, third-trimester pregnancy EXAM: LIMITED OBSTETRIC ULTRASOUND FINDINGS: Number of Fetuses: 1 Heart Rate:  132 bpm Movement: Present Presentation: Cephalic Previa: Posterior Placental Location: No Amniotic Fluid (Subjective): Normal AFI 14.1 cm BPD:  9.34cm 38w 0d Other: Fluid-filled bladder is incidentally noted. The remainder of the fetal anatomy was not assessed. Maternal Findings: Cervix:  Not assessed Uterus/Adnexae: No abnormality visualized. IMPRESSION: 1. Single live intrauterine pregnancy as above estimated age 21 weeks and 0 days. 2. Normal amniotic fluid index. Electronically Signed   By: Randa Ngo M.D.   On: 12/05/2019 15:01   DG Chest Port 1 View  Result Date: 12/05/2019 CLINICAL DATA:  Cardiomegaly EXAM: PORTABLE CHEST 1 VIEW COMPARISON:  03/17/2019 FINDINGS: Cardiomegaly. Both lungs are clear. The visualized skeletal structures are unremarkable. IMPRESSION: Cardiomegaly without acute abnormality of the lungs in AP portable projection. Electronically Signed   By: Eddie Candle M.D.   On: 12/05/2019 20:20    Impression/Recommendations   Severe preeclampsia, third trimester -Diagnosis of severe preeclampsia made by Dr. Georgianne Fick based on blood pressure criteria -Liver enzymes and platelet counts within normal  limits -Patient achieved good blood pressure control after several antihypertensive agents including multiple doses of labetalol, hydralazine and subsequently nifedipine -Patient is currently being induced with Cytotec -Continued management per OB team  Suspect Chronic Hypertension -Suspect undiagnosed chronic hypertension based on review of ED visits as far back as July 2016 with recorded BP during an ER visit of 164/97, 141/74 July 2018, 168/118 may 2019 -Consider discharging patient on antihypertensives if systolic blood pressure not consistently below 140, or for the least having close outpatient PCP follow-up for continued monitoring  Cardiomegaly, 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 BNP slightly elevated at 195. -She at this time is asymptomatic for cardiomyopathy and that she denies shortness of breath, and pedal edema and otherwise feels her usual self -Chest x-ray shows cardiomegaly.  Also showed cardiomegaly as far back as 2018 - BNP is elevated so there is some cardiac strain  -follow EKG, Echocardiogram in the a.m. -Strict blood pressure control, -We will continue to follow patient in the peripartum period until certain she remains stable.    Thank you for this consultation.  Our [facility] hospitalist team will follow the patient with you.   Time Spent: 60 minutes      Athena Masse M.D. [facility] 12/05/2019, 8:47 PM

## 2019-12-05 NOTE — Progress Notes (Addendum)
Subjective:  Comfortable feeling some contractions.    Objective:   Vitals: Blood pressure (!) 172/95, pulse 95, temperature 98.6 F (37 C), temperature source Oral, resp. rate 20, height 5\' 6"  (1.676 m), weight (!) 160.6 kg, last menstrual period 03/10/2019. General: NAD Abdomen: gravid, non-tender Cervical Exam:  Dilation: Closed Effacement (%): Thick Station: Ballotable Exam by:: BN Rn   FHT: 125, moderate, +accels, no decels Toco: not picking up well secondary habitus  Results for orders placed or performed during the hospital encounter of 12/05/19 (from the past 24 hour(s))  CBC     Status: Abnormal   Collection Time: 12/05/19  3:19 PM  Result Value Ref Range   WBC 9.8 4.0 - 10.5 K/uL   RBC 3.43 (L) 3.87 - 5.11 MIL/uL   Hemoglobin 9.7 (L) 12.0 - 15.0 g/dL   HCT 02/04/20 (L) 62.9 - 52.8 %   MCV 86.6 80.0 - 100.0 fL   MCH 28.3 26.0 - 34.0 pg   MCHC 32.7 30.0 - 36.0 g/dL   RDW 41.3 24.4 - 01.0 %   Platelets 317 150 - 400 K/uL   nRBC 0.0 0.0 - 0.2 %  Comprehensive metabolic panel     Status: Abnormal   Collection Time: 12/05/19  3:19 PM  Result Value Ref Range   Sodium 138 135 - 145 mmol/L   Potassium 4.0 3.5 - 5.1 mmol/L   Chloride 108 98 - 111 mmol/L   CO2 21 (L) 22 - 32 mmol/L   Glucose, Bld 88 70 - 99 mg/dL   BUN 8 6 - 20 mg/dL   Creatinine, Ser 02/04/20 0.44 - 1.00 mg/dL   Calcium 8.6 (L) 8.9 - 10.3 mg/dL   Total Protein 6.4 (L) 6.5 - 8.1 g/dL   Albumin 3.0 (L) 3.5 - 5.0 g/dL   AST 18 15 - 41 U/L   ALT 11 0 - 44 U/L   Alkaline Phosphatase 123 38 - 126 U/L   Total Bilirubin 0.5 0.3 - 1.2 mg/dL   GFR calc non Af Amer >60 >60 mL/min   GFR calc Af Amer >60 >60 mL/min   Anion gap 9 5 - 15  Type and screen [facility]     Status: None   Collection Time: 12/05/19  3:25 PM  Result Value Ref Range   ABO/RH(D) A POS    Antibody Screen NEG    Sample Expiration      12/08/2019,2359 Performed at [facility], [address]  [place], [place]   Protein / creatinine ratio, urine     Status: None   Collection Time: 12/05/19  3:41 PM  Result Value Ref Range   Creatinine, Urine 270 mg/dL   Total Protein, Urine 27 mg/dL   Protein Creatinine Ratio 0.10 0.00 - 0.15 mg/mg[Cre]  Respiratory Panel by RT PCR (Flu A&B, Covid) - Nasopharyngeal Swab     Status: None   Collection Time: 12/05/19  3:41 PM   Specimen: Nasopharyngeal Swab  Result Value Ref Range   SARS Coronavirus 2 by RT PCR NEGATIVE NEGATIVE   Influenza A by PCR NEGATIVE NEGATIVE   Influenza B by PCR NEGATIVE NEGATIVE    Assessment:   39 y.o. 30 [redacted]w[redacted]d IOL preeclampsia with severe features by BP criteria  Plan:   1) Labor - received 1 dose of cytotec  2) Fetus - cat I tracing  3) Preeclamspia with severe feature by BP criteria with BP refractory to treatment - labs including P/C ratio  normal - magnesium sulfate   BP treatment  - received an initial dose of 200mg  of labetalol while waiting for IV access - received 20, 40, then 80mg  of IV labetalol - received 10mg  of IV hydralazine will administer and additional 10mg  of IV hydralazine - added immediate release nifedipine prn 10mg  q4hrs for a total of 4 doses if unresponsive to nifedipine  Review of chart reveals that the patient has had similar range blood pressures during previous ER encounters  03/16/2015 and 01/06/2018.  CT scan 03/15/2017 showing borderline cardiomegaly.  Will obtain medicine consult   Malachy Mood, MD, [place], [facility] 12/05/2019, 6:49 PM

## 2019-12-05 NOTE — H&P (Signed)
Obstetric H&P   Chief Complaint: Scheduled NST  Prenatal Care Provider: [facility]  History of Present Illness: 39 y.o. [id number] 21w2dby 12/24/2019, by 6 week Ultrasound presenting to L&D for scheduled NST secondary to low AFI noted 12/03/2019 of 5.5cm, repeat today 14.1cm.  Most recent growth was 11/28/2019 and showed an EFW of 3236g or 7lbs 2oz c/w 73.6%ile.  The patient's pregnancy has been uncomplicated other than BMI >50, as well as AMA.  On presentation today while follow up AFI is normal and NST is reactive the patient was noted to displayed elevated blood pressures.  She has not history of CHTN and has been normotensive throughout her prenatal course.  +FM, no LOF, occasional contractions, no VB.  She does report a headache since 12/03/2019.  Some increase in edema.  Most recent cervical check 12/03/2019 0/20/-3.  Pregravid weight 165.1 kg Total Weight Gain -4.536 kg  pregnancy Problems (from 04/24/19 to present)    Problem Noted Resolved   Positive GBS test 12/01/2019 by SMalachy Mood MD No   Anemia affecting pregnancy 10/05/2019 by SMalachy Mood MD No   Supervision of high risk pregnancy, antepartum 04/24/2019 by GRod Can CNM No   Overview Addendum 12/01/2019  9:36 AM by SMalachy Mood M[place]ridgePrenatal Labs  Dating 6 week UKoreaBlood type: A/Positive/-- (09/25 1539)   Genetic Screen NIPS: normal XY Antibody:Negative (09/25 1539)  Anatomic UKoreaComplete and normal 08/22/2019 Rubella: 10.20 (09/25 1539) Varicella: Immune  GTT Early: 119 Third trimester: 104 RPR: Non Reactive (09/25 1539)   Rhogam N/A HBsAg: Negative (09/25 1539)   TDaP vaccine 10/16/19 Flu Shot: considering HIV: Non Reactive (09/25 1539)   Baby Food Considering breast                GBS: positive  Contraception  Pap: 2020 negative  CBB  No   CS/VBAC n/a   Support Person Husband ECornelia Copa         Obesity affecting pregnancy, antepartum 04/24/2019 by GRod Can CNM No   Overview Addendum  11/13/2019  4:15 PM by HGae Dry MD    BMI >=40 [x ] early 1h gtt - nml [x ] u/s for dating [ x]  [x ] nutritional goals [x ] folic acid 166m[x ] bASA (>12 weeks) _0  consider nutrition consult _1  consider maternal EKG 1st trimester [x ] Growth u/s 2856x ], 3241x ], 36 weeks _2  [x ] NST/AFI weekly 36+ weeks (36_3 , 37_4 , 38_5 , 39_6 , 40_7 ) _8  IOL by 41 weeks (scheduled, prn _9 )          Review of Systems: 10 point review of systems negative unless otherwise noted in HPI  Past Medical History: Patient Active Problem List   Diagnosis Date Noted  . Severe preeclampsia, third trimester 12/05/2019  . Positive GBS test 12/01/2019  . Anemia affecting pregnancy 10/05/2019  . Nipple problem 07/10/2019  . Nausea and vomiting during pregnancy 06/25/2019  . Supervision of high risk pregnancy, antepartum 04/24/2019    Clinic [facility] Prenatal Labs  Dating 6 week USKorealood type: A/Positive/-- (09/25 1539)   Genetic Screen NIPS: normal XY Antibody:Negative (09/25 1539)  Anatomic USKoreaomplete and normal 08/22/2019 Rubella: 10.20 (09/25 1539) Varicella: Immune  GTT Early: 1175hird trimester: 104 RPR: Non Reactive (09/25 1539)   Rhogam N/A HBsAg: Negative (09/25 1539)   TDaP vaccine         Flu Shot: considering HIV: Non Reactive (09/25  1539)   Baby Food Considering breast                GBS: positive  Contraception  Pap: 2020 negative  CBB  No   CS/VBAC n/a   Support Person Husband Cornelia Copa       . Obesity affecting pregnancy, antepartum 04/24/2019    BMI >=40 [x ] early 1h gtt - nml [x ] u/s for dating [ x]  [x ] nutritional goals [x ] folic acid 85m [x ] bASA (>12 weeks) _0  consider nutrition consult _1  consider maternal EKG 1st trimester [x ] Growth u/s 21[x ], 39[x ], 36 weeks _2  [x ] NST/AFI weekly 36+ weeks (36_3 , 37_4 , 38_5 , 39_6 , 40_7 ) _8  IOL by 41 weeks (scheduled, prn _9 )   . BMI 50.0-59.9, adult ([facility] 04/24/2019  . Hyperlipidemia 04/04/2019  . Elevated  erythrocyte sedimentation rate 03/25/2019  . Scleritis of left eye 03/25/2019    Last Assessment & Plan:  July 2020: negative ANCA panel/CCP/ANA/CRP/RPR/GC (endocervical) ESR 79 Normal CMP Normal chest x ray Normal Brain and Orbit MRIs   . 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 Breast lump 01/31/2019  . DDD (degenerative disc disease), lumbar 01/31/2019  . Chronic left-sided low back pain with left-sided sciatica 01/31/2019  . Extreme obesity 01/31/2019    Past Surgical History: Past Surgical History:  Procedure Laterality Date  . HEMORRHOID SURGERY      Past Obstetric History: # 1 - Date: None, Sex: None, Weight: None, GA: None, Delivery: None, Apgar1: None, Apgar5: None, Living: None, Birth Comments: None  # 2 - Date: None, Sex: None, Weight: None, GA: None, Delivery: None, Apgar1: None, Apgar5: None, Living: None, Birth Comments: None  # 3 - Date: None, Sex: None, Weight: None, GA: None, Delivery: None, Apgar1: None, Apgar5: None, Living: None, Birth Comments: None  # 4 - Date: None, Sex: None, Weight: None, GA: None, Delivery: None, Apgar1: None, Apgar5: None, Living: None, Birth Comments: None  # 5 - Date: None, Sex: None, Weight: None, GA: None, Delivery: None, Apgar1: None, Apgar5: None, Living: None, Birth Comments: None   Past Gynecologic History:  Family History: Family History  Problem Relation Age of Onset  . Hypertension Mother   . Diabetes Mother   . Hypertension Father   . Diabetes Father   . Asthma Daughter   . Breast cancer Paternal AAunt 25      Deceased from metastasis  . Cancer Maternal Grandmother   . Brain cancer Maternal Grandmother   . Brain cancer Paternal Grandmother   . Colon cancer Paternal Grandfather     Social History: Social History   Socioeconomic History  . Marital status: Married    Spouse name: ECornelia Copa . Number of children: 2  . Years of education: Not on file  . Highest education level: Not on file  Occupational  History  . Not on file  Tobacco Use  . Smoking status: Former Smoker    Quit date: 01/31/2016    Years since quitting: 3.8  . Smokeless tobacco: Never Used  Substance and Sexual Activity  . Alcohol use: No  . Drug use: Never  . Sexual activity: Yes    Partners: Male    Birth control/protection: None  Other Topics Concern  . Not on file  Social History Narrative   Have 2 children from previous relationships now married and they are trying to conceive    + POC urine preg 04/17/2019  Social Determinants of Health   Financial Resource Strain: Low Risk   . Difficulty of Paying Living Expenses: Not hard at all  Food Insecurity: No Food Insecurity  . Worried About Charity fundraiser in the Last Year: Never true  . Ran Out of Food in the Last Year: Never true  Transportation Needs: No Transportation Needs  . Lack of Transportation (Medical): No  . Lack of Transportation (Non-Medical): No  Physical Activity: Sufficiently Active  . Days of Exercise per Week: 7 days  . Minutes of Exercise per Session: 40 min  Stress: Stress Concern Present  . Feeling of Stress : To some extent  Social Connections: Not Isolated  . Frequency of Communication with Friends and Family: More than three times a week  . Frequency of Social Gatherings with Friends and Family: More than three times a week  . Attends Religious Services: 1 to 4 times per year  . Active Member of Clubs or Organizations: Not on file  . Attends Archivist Meetings: 1 to 4 times per year  . Marital Status: Married  Human resources officer Violence: Not At Risk  . Fear of Current or Ex-Partner: No  . Emotionally Abused: No  . Physically Abused: No  . Sexually Abused: No    Medications: Prior to Admission medications   Medication Sig Start Date End Date Taking? Authorizing Provider  ferrous sulfate (FERROUSUL) 325 (65 FE) MG tablet Take 1 tablet (325 mg total) by mouth daily with breakfast. 10/05/19   Malachy Mood, MD    Prenatal Vit-Fe Fumarate-FA (PRENATAL VITAMINS PO) Take 1 tablet by mouth 1 day or 1 dose.    [provider]    Allergies: Allergies  Allergen Reactions  . Peanuts [Peanut Oil] Swelling    Physical Exam: Vitals: Blood pressure (!) 170/92, pulse 70, temperature 98.9 F (37.2 C), temperature source Oral, resp. rate 18, last menstrual period 03/10/2019.  Urine Dip Protein: P/C ratio  FHT: 120, moderate, +accels, no decels Toco: q3-47mn  General: NAD HEENT: normocephalic, anicteric Pulmonary: No increased work of breathing Cardiovascular: RRR, distal pulses 2+ Abdomen: Gravid, non-tender Leopolds: vertex on ultrasound  Extremities: no edema, erythema, or tenderness Neurologic: Grossly intact Psychiatric: mood appropriate, affect full  Labs: No results found for this or any previous visit (from the past 24 hour(s)).  Assessment: 39y.o. [id number] 12/24/2019, by 6 week Ultrasound with preeclampsia with severe features by blood pressure criteria  Plan: 1) Preeclampsia with severe feature - several severe range BP's on presentation.  IV team called to obtain IV access.   - Labetalol 20076mo once until IV access  - Preeclampsia labs - Start magnesium sulfate for seizure ppx - cytotec induction based on closed at most recent clinic visit this week  2) Fetus - cat I tracing  3) PNL - Blood type A/Positive/-- (09/25 1539) / Anti-bodyscreen Negative (09/25 1539) / Rubella 10.20 (09/25 1539) / Varicella Immune / RPR Non Reactive (01/29 1558) / HBsAg Negative (09/25 1539) / HIV Non Reactive (01/29 1558) / 1-hr OGTT normal / GBS Positive/-- (03/26 1622)  4) Immunization History -  Immunization History  Administered Date(s) Administered  . Tdap 10/16/2019    5) Disposition - pending delivery  AndMalachy MoodD, F[facility] 12/05/2019, 3:22 PM

## 2019-12-05 NOTE — Progress Notes (Signed)
Review of records reveals an old echo from 08/20/2012 at [place]        Size: Normal  Dissection: INDETERM FOR DISSECTION  AORTIC VALVE    Leaflets: Tricuspid             Morphology: Normal                                      Mobility: Fully Mobile  LEFT VENTRICLE                                      Anterior: Normal        Size: Normal                                 Lateral: Normal Contraction: Normal                                  Septal: Normal  Closest EF: >55% (Estimated)                        Apical: Normal   LV masses: No Masses                             Inferior: Normal         LVH: None                                 Posterior: Normal       LVIDd:  5.4 cm  LVIDs:  3.6 cm  PWT: 0.9 cm  SWT: 0.9 cm  FS: 0.33  MITRAL VALVE    Leaflets: Normal Fully mobile  Morphology: Normal  LEFT ATRIUM        Size: MILDLY ENLARGED            Diameter:  4.5 cm   LA masses: No masses              Normal IAS RIGHT VENTRICLE        Size: Normal                    Free wall: Normal Contraction: Normal                    RV masses: No Masses  RIGHT ATRIUM        Size: MILDLY ENLARGED            RA Other: None   RA masses: No masses  PERICARDIUM       Fluid: No effusion  DOPPLER ECHO and OTHER SPECIAL PROCEDURES ------------------------------------   Aortic: No AR                  No AS   Mitral: MILD MR                No MS Tricuspid: MODERATE TR            No TS           3.8 m/s peak TR vel   42  mmHg peak RV pressure Pulmonary: MILD PR                No PS    Other:  INTERPRETATION ---------------------------------------------------------------  NORMAL LEFT VENTRICULAR FUNCTION  VALVULAR REGURGITATION: MILD MR, MILD PR, MODERATE TR  NO VALVULAR STENOSIS

## 2019-12-05 NOTE — Progress Notes (Addendum)
Pt presents to L&D scheduled NST. Pt is a 39 y/o [id number] [redacted]w[redacted]d.  Pt reports irregular contractions, denies leaking of fluid or vaginal bleeding. Pt reports positive fetal movement. Pt reports "bad" headache since Wednesday and blurry vision since Tuesday. Pt denies RUQ pain. +2 reflexes and absent clonus. External monitors applied and assessing. VS cycling q15

## 2019-12-06 ENCOUNTER — Inpatient Hospital Stay
Admit: 2019-12-06 | Discharge: 2019-12-06 | Disposition: A | Discharge status: Home / Self Care | Payer: Medicaid Other | Attending: Obstetrics and Gynecology | Admitting: Obstetrics and Gynecology

## 2019-12-06 DIAGNOSIS — I10 Essential (primary) hypertension: Secondary | ICD-10-CM

## 2019-12-06 DIAGNOSIS — I517 Cardiomegaly: Secondary | ICD-10-CM

## 2019-12-06 LAB — RUPTURE OF MEMBRANE (ROM)PLUS: Rom Plus: NEGATIVE

## 2019-12-06 LAB — ECHOCARDIOGRAM COMPLETE
Height: 66 in
Weight: 5664 oz

## 2019-12-06 LAB — RPR: RPR Ser Ql: NONREACTIVE

## 2019-12-06 LAB — ABO/RH: ABO/RH(D): A POS

## 2019-12-06 MED ORDER — NIFEDIPINE ER OSMOTIC RELEASE 30 MG PO TB24
90.0000 mg | ORAL_TABLET | Freq: Every day | ORAL | Status: DC
  Administered 2019-12-06 – 2019-12-08 (×3): 90 mg via ORAL
  Filled 2019-12-06 (×3): qty 3

## 2019-12-06 MED ORDER — DIPHENHYDRAMINE HCL 50 MG/ML IJ SOLN
25.0000 mg | Freq: Once | INTRAMUSCULAR | Status: AC
  Administered 2019-12-06: 25 mg via INTRAVENOUS
  Filled 2019-12-06: qty 1

## 2019-12-06 MED ORDER — BUTALBITAL-APAP-CAFFEINE 50-325-40 MG PO TABS
ORAL_TABLET | ORAL | Status: AC
  Administered 2019-12-06: 08:00:00 2 via ORAL
  Filled 2019-12-06: qty 2

## 2019-12-06 MED ORDER — BUTALBITAL-APAP-CAFFEINE 50-325-40 MG PO TABS: 2.0000 | ORAL_TABLET | Freq: Four times a day (QID) | ORAL | Status: DC | PRN

## 2019-12-06 MED ORDER — PROCHLORPERAZINE EDISYLATE 10 MG/2ML IJ SOLN
10.0000 mg | Freq: Once | INTRAMUSCULAR | Status: AC
  Administered 2019-12-06: 10 mg via INTRAVENOUS
  Filled 2019-12-06: qty 2

## 2019-12-06 MED ORDER — NIFEDIPINE ER OSMOTIC RELEASE 30 MG PO TB24: 90.0000 mg | ORAL_TABLET | Freq: Every day | ORAL | Status: DC

## 2019-12-06 NOTE — Progress Notes (Signed)
Labor Progress Note   39 y.o. [id number] @ [redacted]w[redacted]d , admitted for Pregnancy, Labor Management.   Pregnancy complicated by preeclampsia with severe features  Subjective:  Patient has had complaint of severe headache not relived by tylenol or a dose of fioricet given earlier today. She is now responding well to benadryl/compazine combination. She denies visual changes, epigastric pain, chest pain, shortness of breath. We have discussed the plan of care both prior to results of the Echo and after reviewing Echo report. We will continue with induction and restart pitocin. We discussed pain relief options. Patient would like to avoid epidural, however she will consider. She is unable to have stadol due to earlier hypotensive episode after stadol. So far she has mainly felt abdominal tightening.   Patient Active Problem List   Diagnosis Date Noted  . Severe preeclampsia, third trimester 12/05/2019  . Positive GBS test 12/01/2019  . Anemia affecting pregnancy 10/05/2019  . Nipple problem 07/10/2019  . Nausea and vomiting during pregnancy 06/25/2019  . Supervision of high risk pregnancy, antepartum 04/24/2019  . Obesity affecting pregnancy, antepartum 04/24/2019  . BMI 50.0-59.9, adult ([facility]) 04/24/2019  . Hyperlipidemia 04/04/2019  . Elevated erythrocyte sedimentation rate 03/25/2019  . Scleritis of left eye 03/25/2019  . 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 Breast lump 01/31/2019  . DDD (degenerative disc disease), lumbar 01/31/2019  . Chronic left-sided low back pain with left-sided sciatica 01/31/2019  . Extreme obesity 01/31/2019    Objective:  BP 122/60   Pulse 71   Temp 98.1 F (36.7 C) (Oral)   Resp 20   Ht 5\' 6"  (1.676 m)   Wt (!) 160.6 kg   LMP 03/10/2019 (Exact Date)   BMI 57.14 kg/m   Lungs: BCTA Abd: gravid, FHT present Extr: 1+ bilateral pedal edema, SCDs in place Neuro: 2+ reflexes, no clonus SVE: CERVIX: will wait to check given pitocin break  EFM: FHR: 110 bpm,  variability: moderate,  accelerations:  Present,  decelerations:  Absent Toco: Frequency: Every 1-4 minutes Labs: I have reviewed the patient's lab results.    Vitals:   12/06/19 0900 12/06/19 1000 12/06/19 1100 12/06/19 1200  BP:      Pulse:      Resp: 20 20 20 20   Temp:   98.1 F (36.7 C)   TempSrc:   Oral   Weight:      Height:         Current Vital Signs 24h Vital Sign Ranges  T 98.1 F (36.7 C) Temp  Avg: 98.4 F (36.9 C)  Min: 98 F (36.7 C)  Max: 98.9 F (37.2 C)  BP 122/60 BP  Min: 98/53  Max: 189/90  HR 71 Pulse  Avg: 78.2  Min: 61  Max: 96  RR 20 Resp  Avg: 19  Min: 16  Max: 20  SaO2     No data recorded       24 Hour I/O Current Shift I/O  Time Ins Outs 04/02 0701 - 04/03 0700 In: 3249.6 [P.O.:120; I.V.:2742.1] Out: 1925 [Urine:1925] 04/03 0701 - 04/03 1900 In: 954.9 [P.O.:240; I.V.:514.9] Out: 530 [Urine:530]     Labs:  Recent Labs  Lab 12/05/19 1519  WBC 9.8  HGB 9.7*  HCT 29.7*  PLT 317   Recent Labs  Lab 12/05/19 1519  NA 138  K 4.0  CL 108  CO2 21*  BUN 8  CREATININE 0.52  CALCIUM 8.6*  PROT 6.4*  BILITOT 0.5  ALKPHOS 123  ALT 11  AST 18  GLUCOSE 88    Medications SCHEDULED MEDICATIONS  . NIFEdipine  60 mg Oral Daily  . oxytocin 40 units in LR 1000 mL  500 mL Intravenous Once  . pencillin G potassium IV  3 Million Units Intravenous Q4H    MEDICATION INFUSIONS  . lactated ringers    . lactated ringers 125 mL/hr at 12/06/19 0133  . magnesium sulfate 2 g/hr (12/06/19 0826)  . oxytocin    . oxytocin 2 milli-units/min (12/06/19 1143)    PRN MEDICATIONS  acetaminophen, butalbital-acetaminophen-caffeine, hydrALAZINE, labetalol **AND** labetalol **AND** labetalol **AND** [DISCONTINUED] hydrALAZINE **AND** Measure blood pressure, lactated ringers, lidocaine (PF), misoprostol, NIFEdipine, ondansetron, sodium citrate-citric acid, terbutaline   Assessment & Plan:  39 y.o. [id number] without signs of magnesium toxicity 1)  preeclampsia with severe feature: IV magnesium sulfate IV Labetalol protocol for severe range BPs, may consider PO procardia protocol given no response to IV Labetalol yesterday.  2. Pain management: none. 3. FWB: FHT category I.  4. ID: GBS positive: penicillin prophylaxis 5. Labor management: pitocin  All discussed with patient, see orders   Rod Can, [facility] 12/06/2019  12:07 PM

## 2019-12-06 NOTE — Progress Notes (Signed)
Labor Check  Subj:  Complaints: none, comfortable   Obj:  BP (!) 149/83   Pulse 84   Temp 98.2 F (36.8 C) (Oral)   Resp 20   Ht 5\' 6"  (1.676 m)   Wt (!) 160.6 kg   LMP 03/10/2019 (Exact Date)   SpO2 100%   BMI 57.14 kg/m  Dose (milli-units/min) Oxytocin: 18 milli-units/min  Cervix: Dilation: 3 / Effacement (%): 60 / Station: Ballotable, -3  (recent check by CNM) Baseline FHR: 115 beats/min   Variability: moderate   Accelerations: present   Decelerations: absent Contractions: present frequency: irregular Overall assessment: cat 1  A/P: 39 y.o. 30 female at [redacted]w[redacted]d with preeclampsia with severe features.  1.  Labor: continue pitocin per protocol  2.  FWB: reassuring, Overall assessment: category 1  3.  GBS positive - PCN  4.  Pain: prn 5.  Recheck: as needed   [redacted]w[redacted]d, MD, Thomas[facility] Mohair [facility], [facility] 12/06/2019 4:50 PM

## 2019-12-06 NOTE — Progress Notes (Signed)
*  PRELIMINARY RESULTS* Echocardiogram 2D Echocardiogram has been performed.  Garrel Ridgel Christhoper [facility]bee 12/06/2019, 9:25 AM

## 2019-12-06 NOTE — Progress Notes (Signed)
PROGRESS NOTE    Ashley Herring  XTG:[id number] DOB: 24-Nov-1980 DOA: 12/05/2019 PCP: Doren Custard, FNP    Brief Narrative:  Ashley Herring is an 39 y.o. female at [redacted] weeks gestation, admitted to the [facility] service for scheduled NST, who I was asked to see in consultation for management of elevated blood pressures with systolics in the 170s to 180s and as high as 190.  Patient denies symptoms except for mild headache.  She denies chest pain or shortness of breath.  She has only minimal swelling of the bilateral lower extremities, not unusual at her stage of pregnancy.      Consultants:    Procedures: echo  Antimicrobials:   pcn   Subjective: Had migraine HA. No other complaints.  Objective: Vitals:   12/06/19 1000 12/06/19 1100 12/06/19 1200 12/06/19 1318  BP:    (!) 149/76  Pulse:    79  Resp: 20 20 20 18   Temp:  98.1 F (36.7 C)    TempSrc:  Oral    Weight:      Height:        Intake/Output Summary (Last 24 hours) at 12/06/2019 1358 Last data filed at 12/06/2019 1300 Gross per 24 hour  Intake 4329.46 ml  Output 2575 ml  Net 1754.46 ml   Filed Weights   12/05/19 1819  Weight: (!) 160.6 kg    Examination:  General exam: Appears calm and comfortable  Respiratory system: Clear to auscultation. Respiratory effort normal. Cardiovascular system: S1 & S2 heard, RRR. No JVD, murmurs, rubs, gallops or clicks.  Gastrointestinal system: Abdomen is nondistended, soft and nontender.  Normal bowel sounds heard. Central nervous system: Alert and oriented. Grossly intact Extremities:mild pedal edema b/l Skin: Warm dry Psychiatry: Judgement and insight appear normal. Mood & affect appropriate.     Data Reviewed: I have personally reviewed following labs and imaging studies  CBC: Recent Labs  Lab 12/05/19 1519  WBC 9.8  HGB 9.7*  HCT 29.7*  MCV 86.6  PLT 317   Basic Metabolic Panel: Recent Labs  Lab 12/05/19 1519  NA 138  K 4.0  CL 108  CO2 21*  GLUCOSE 88   BUN 8  CREATININE 0.52  CALCIUM 8.6*   GFR: Estimated Creatinine Clearance: 151.7 mL/min (by C-G formula based on SCr of 0.52 mg/dL). Liver Function Tests: Recent Labs  Lab 12/05/19 1519  AST 18  ALT 11  ALKPHOS 123  BILITOT 0.5  PROT 6.4*  ALBUMIN 3.0*   No results for input(s): LIPASE, AMYLASE in the last 168 hours. No results for input(s): AMMONIA in the last 168 hours. Coagulation Profile: No results for input(s): INR, PROTIME in the last 168 hours. Cardiac Enzymes: No results for input(s): CKTOTAL, CKMB, CKMBINDEX, TROPONINI in the last 168 hours. BNP (last 3 results) No results for input(s): PROBNP in the last 8760 hours. HbA1C: No results for input(s): HGBA1C in the last 72 hours. CBG: No results for input(s): GLUCAP in the last 168 hours. Lipid Profile: No results for input(s): CHOL, HDL, LDLCALC, TRIG, CHOLHDL, LDLDIRECT in the last 72 hours. Thyroid Function Tests: No results for input(s): TSH, T4TOTAL, FREET4, T3FREE, THYROIDAB in the last 72 hours. Anemia Panel: No results for input(s): VITAMINB12, FOLATE, FERRITIN, TIBC, IRON, RETICCTPCT in the last 72 hours. Sepsis Labs: No results for input(s): PROCALCITON, LATICACIDVEN in the last 168 hours.  Recent Results (from the past 240 hour(s))  Strep Gp B NAA     Status: Abnormal   Collection Time: 11/28/19  4:22 PM   Specimen: Vaginal/Rectal; Sterile Swab   VR  Result Value Ref Range Status   Strep Gp B NAA Positive (A) Negative Final    Comment: Centers for Disease Control and Prevention (CDC) and American Congress of Obstetricians and Gynecologists (ACOG) guidelines for prevention of perinatal group B streptococcal (GBS) disease specify co-collection of a vaginal and rectal swab specimen to maximize sensitivity of GBS detection. Per the CDC and ACOG, swabbing both the lower vagina and rectum substantially increases the yield of detection compared with sampling the vagina alone. Penicillin G, ampicillin,  or cefazolin are indicated for intrapartum prophylaxis of perinatal GBS colonization. Reflex susceptibility testing should be performed prior to use of clindamycin only on GBS isolates from penicillin-allergic women who are considered a high risk for anaphylaxis. Treatment with vancomycin without additional testing is warranted if resistance to clindamycin is noted.   Respiratory Panel by RT PCR (Flu A&B, Covid) - Nasopharyngeal Swab     Status: None   Collection Time: 12/05/19  3:41 PM   Specimen: Nasopharyngeal Swab  Result Value Ref Range Status   SARS Coronavirus 2 by RT PCR NEGATIVE NEGATIVE Final    Comment: (NOTE) SARS-CoV-2 target nucleic acids are NOT DETECTED. The SARS-CoV-2 RNA is generally detectable in upper respiratoy specimens during the acute phase of infection. The lowest concentration of SARS-CoV-2 viral copies this assay can detect is 131 copies/mL. A negative result does not preclude SARS-Cov-2 infection and should not be used as the sole basis for treatment or other patient management decisions. A negative result may occur with  improper specimen collection/handling, submission of specimen other than nasopharyngeal swab, presence of viral mutation(s) within the areas targeted by this assay, and inadequate number of viral copies (<131 copies/mL). A negative result must be combined with clinical observations, patient history, and epidemiological information. The expected result is Negative. Fact Sheet for Patients:  https://www.moore.com/ Fact Sheet for Healthcare Providers:  https://www.young.biz/ This test is not yet ap proved or cleared by the [place] FDA and  has been authorized for detection and/or diagnosis of SARS-CoV-2 by FDA under an Emergency Use Authorization (EUA). This EUA will remain  in effect (meaning this test can be used) for the duration of the COVID-19 declaration under Section 564(b)(1) of the Act,  21 U.S.C. section 360bbb-3(b)(1), unless the authorization is terminated or revoked sooner.    Influenza A by PCR NEGATIVE NEGATIVE Final   Influenza B by PCR NEGATIVE NEGATIVE Final    Comment: (NOTE) The Xpert Xpress SARS-CoV-2/FLU/RSV assay is intended as an aid in  the diagnosis of influenza from Nasopharyngeal swab specimens and  should not be used as a sole basis for treatment. Nasal washings and  aspirates are unacceptable for Xpert Xpress SARS-CoV-2/FLU/RSV  testing. Fact Sheet for Patients: https://www.moore.com/ Fact Sheet for Healthcare Providers: https://www.young.biz/ This test is not yet approved or cleared by the [place] FDA and  has been authorized for detection and/or diagnosis of SARS-CoV-2 by  FDA under an Emergency Use Authorization (EUA). This EUA will remain  in effect (meaning this test can be used) for the duration of the  Covid-19 declaration under Section 564(b)(1) of the Act, 21  U.S.C. section 360bbb-3(b)(1), unless the authorization is  terminated or revoked. Performed at [facility], [address] [place], [place] [address]          Radiology Studies: [place] Limited  Result Date: 12/05/2019 CLINICAL DATA:  Oligohydramnios, third-trimester pregnancy EXAM: LIMITED OBSTETRIC ULTRASOUND FINDINGS: Number  of Fetuses: 1 Heart Rate:  132 bpm Movement: Present Presentation: Cephalic Previa: Posterior Placental Location: No Amniotic Fluid (Subjective): Normal AFI 14.1 cm BPD:  9.34cm 38w 0d Other: Fluid-filled bladder is incidentally noted. The remainder of the fetal anatomy was not assessed. Maternal Findings: Cervix:  Not assessed Uterus/Adnexae: No abnormality visualized. IMPRESSION: 1. Single live intrauterine pregnancy as above estimated age 38 weeks and 0 days. 2. Normal amniotic fluid index. Electronically Signed   By: Sharlet SalinaMichael  Brown M.D.   On: 12/05/2019 15:01   DG Chest Port 1 View  Result Date:  12/05/2019 CLINICAL DATA:  Cardiomegaly EXAM: PORTABLE CHEST 1 VIEW COMPARISON:  03/17/2019 FINDINGS: Cardiomegaly. Both lungs are clear. The visualized skeletal structures are unremarkable. IMPRESSION: Cardiomegaly without acute abnormality of the lungs in AP portable projection. Electronically Signed   By: Lauralyn PrimesAlex  Bibbey M.D.   On: 12/05/2019 20:20   ECHOCARDIOGRAM COMPLETE  Result Date: 12/06/2019    ECHOCARDIOGRAM REPORT   Patient Name:   Harden MoLASHAUNDA ROGERS Date of Exam: 12/06/2019 Medical Rec #:  [id number]        Height:       66.0 in Accession #:    [id number]       Weight:       354.0 lb Date of Birth:  04/02/1981         BSA:          2.549 m Patient Age:    38 years         BP:           132/75 mmHg Patient Gender: F                HR:           78 bpm. Exam Location:  [facility] Procedure: 2D Echo Indications:     Cardiomegaly 429.3/ I51.7  History:         Patient has no prior history of Echocardiogram examinations.  Sonographer:     Wonda Ceriseikeshia Stills RDCS Referring Phys:  [phone] ANDREAS STAEBLER Diagnosing Phys: Adrian BlackwaterShaukat Khan MD IMPRESSIONS  1. Left ventricular ejection fraction, by estimation, is 60 to 65%. The left ventricle has normal function. The left ventricle has no regional wall motion abnormalities. Left ventricular diastolic parameters are consistent with Grade I diastolic dysfunction (impaired relaxation).  2. Right ventricular systolic function is normal. The right ventricular size is normal.  3. Left atrial size was mildly dilated.  4. The mitral valve is normal in structure. Trivial mitral valve regurgitation. No evidence of mitral stenosis.  5. The aortic valve is normal in structure. Aortic valve regurgitation is not visualized. No aortic stenosis is present.  6. The inferior vena cava is normal in size with greater than 50% respiratory variability, suggesting right atrial pressure of 3 mmHg. FINDINGS  Left Ventricle: Left ventricular ejection fraction, by estimation, is 60 to 65%. The left ventricle  has normal function. The left ventricle has no regional wall motion abnormalities. The left ventricular internal cavity size was normal in size. There is  no left ventricular hypertrophy. Left ventricular diastolic parameters are consistent with Grade I diastolic dysfunction (impaired relaxation). Right Ventricle: The right ventricular size is normal. No increase in right ventricular wall thickness. Right ventricular systolic function is normal. Left Atrium: Left atrial size was mildly dilated. Right Atrium: Right atrial size was normal in size. Pericardium: There is no evidence of pericardial effusion. Mitral Valve: The mitral valve is normal in structure. Normal mobility of the mitral valve leaflets.  Trivial mitral valve regurgitation. No evidence of mitral valve stenosis. Tricuspid Valve: The tricuspid valve is normal in structure. Tricuspid valve regurgitation is trivial. No evidence of tricuspid stenosis. Aortic Valve: The aortic valve is normal in structure. Aortic valve regurgitation is not visualized. No aortic stenosis is present. Aortic valve peak gradient measures 11.8 mmHg. Pulmonic Valve: The pulmonic valve was normal in structure. Pulmonic valve regurgitation is trivial. No evidence of pulmonic stenosis. Aorta: The aortic root is normal in size and structure. Venous: The inferior vena cava is normal in size with greater than 50% respiratory variability, suggesting right atrial pressure of 3 mmHg. IAS/Shunts: No atrial level shunt detected by color flow Doppler.  LEFT VENTRICLE PLAX 2D LVIDd:         5.50 cm      Diastology LVIDs:         3.77 cm      LV e' lateral:   12.00 cm/s LV PW:         1.15 cm      LV E/e' lateral: 7.9 LV IVS:        1.08 cm      LV e' medial:    6.96 cm/s LVOT diam:     2.10 cm      LV E/e' medial:  13.7 LV SV:         97 LV SV Index:   38 LVOT Area:     3.46 cm  LV Volumes (MOD) LV vol d, MOD A4C: 124.0 ml LV vol s, MOD A4C: 50.6 ml LV SV MOD A4C:     124.0 ml RIGHT VENTRICLE  RV Basal diam:  3.77 cm RV S prime:     17.00 cm/s TAPSE (M-mode): 3.8 cm LEFT ATRIUM             Index       RIGHT ATRIUM           Index LA diam:        4.50 cm 1.77 cm/m  RA Area:     23.10 cm LA Vol (A2C):   51.3 ml 20.13 ml/m RA Volume:   79.50 ml  31.19 ml/m LA Vol (A4C):   89.7 ml 35.19 ml/m LA Biplane Vol: 74.0 ml 29.03 ml/m  AORTIC VALVE                PULMONIC VALVE AV Area (Vmax): 2.38 cm    PV Vmax:       1.29 m/s AV Vmax:        172.00 cm/s PV Peak grad:  6.7 mmHg AV Peak Grad:   11.8 mmHg LVOT Vmax:      118.00 cm/s LVOT Vmean:     81.900 cm/s LVOT VTI:       0.279 m  AORTA Ao Root diam: 2.70 cm Ao Asc diam:  2.90 cm MITRAL VALVE               TRICUSPID VALVE MV Area (PHT): 4.60 cm    TV Peak grad:   27.3 mmHg MV Decel Time: 165 msec    TV Vmax:        2.61 m/s MV E velocity: 95.10 cm/s MV A velocity: 83.40 cm/s  SHUNTS MV E/A ratio:  1.14        Systemic VTI:  0.28 m  Systemic Diam: 2.10 cm Adrian Blackwater MD Electronically signed by Adrian Blackwater MD Signature Date/Time: 12/06/2019/11:33:16 AM    Final         Scheduled Meds: . NIFEdipine  60 mg Oral Daily  . oxytocin 40 units in LR 1000 mL  500 mL Intravenous Once  . pencillin G potassium IV  3 Million Units Intravenous Q4H   Continuous Infusions: . lactated ringers    . lactated ringers 69 mL/hr at 12/06/19 1300  . magnesium sulfate 2 g/hr (12/06/19 0826)  . oxytocin    . oxytocin 4 milli-units/min (12/06/19 1300)    Assessment & Plan:   Active Problems:   Severe preeclampsia, third trimester  Severe preeclampsia, third trimester -Diagnosis of severe preeclampsia made by Dr. Bonney Aid based on blood pressure criteria -Liver enzymes and platelet counts within normal limits Bp better, has room for more improvement.  On Nifedipine.  If need to can add low dose labetalol bid Echo nml EF.  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 BNP slightly elevated at 195. -She at this time is asymptomatic for cardiomyopathy . However echo with normal EF. Will need hypertension management as outpatient, can consider follow-up with a cardiologist upon discharge           LOS: 1 day   Time spent: 45 minutes with more than 50% COC    Lynn Ito, MD [facility] Pager [phone]  If 7PM-7AM, please contact night-coverage [email or web address] Password [email or web address] 12/06/2019, 1:58 PM

## 2019-12-06 NOTE — Progress Notes (Signed)
  Labor Progress Note   39 y.o. [id number] @ [redacted]w[redacted]d , admitted for  Pregnancy, Labor Management.   Subjective:  After pitocin break for a meal, patient is back on pitocin and once again feeling abdominal tightness.   Objective:  BP (!) 146/78   Pulse 84   Temp 98.4 F (36.9 C) (Oral)   Resp 16   Ht 5\' 6"  (1.676 m)   Wt (!) 160.6 kg   LMP 03/10/2019 (Exact Date)   SpO2 100%   BMI 57.14 kg/m  Abd: gravid, ND, FHT present, mild tenderness on exam Extr: 1+ bilateral pedal edema SVE: CERVIX: 3 cm dilated, 70 effaced, -2 station Exam limited by body habitus  EFM: FHR: 125 bpm, variability: moderate,  accelerations:  Present,  decelerations:  Absent Toco: Frequency: Every 2-6 minutes Labs: I have reviewed the patient's lab results.   Assessment & Plan:  05/11/2019 @ [redacted]w[redacted]d, admitted for  Pregnancy and Labor/Delivery Management  1. Pain management: none. 2. FWB: FHT category I.  3. ID: GBS positive: penicillin prophylaxis 4. Labor management: continue pitocin  All discussed with patient, see orders   [redacted]w[redacted]d, CNM [facility] 12/06/2019  11:01 PM

## 2019-12-06 NOTE — Progress Notes (Signed)
I assumed care of this patient at 0800 today. I received a direct report from Dr. Bonney Aid who cared for her since hospitalization.   Her issues are morbid obesity with BMI 57, preeclampsia with severe features (blood pressure) that was difficult to get into a reasonable range initially.  More recently her blood pressures have improved. General medicine has previously been consulted. She has cardiomegaly on AP CXR.  This does not appear to be new for her.  There is also some indication in her chart that hypertension might not be new for her either.  I have discussed her case with anesthesia, as well. She had an ECHO this AM and we are awaiting the result from the ECHO.   The plan for the moment is to hold her pitocin, given her clinical stability, until the ECHO report returns.  I feel comfortable in holding her pitocin because I expect the ECHO report to return soon.  If there is any indication of CHF or a limited ejection fraction, I have discussed with anesthesia the comfort level of the facility in managing a patient with these concurrent issues.   If EF is low, will initiate transfer of patient to a tertiary care center.  If EF is nearly normal, we should be able to continue her induction process safely.  Anesthesia is in agreement. I have discussed her case with Dr. Karlton Lemon, who agrees with the above.    Thomasene Mohair, MD, [facility], [facility] 12/06/2019 10:26 AM  i

## 2019-12-06 NOTE — Progress Notes (Signed)
  Labor Progress Note   39 y.o. [id number] @ [redacted]w[redacted]d , admitted for  Pregnancy, Labor Management.   Subjective:  Comfortable, headache resolved. Feels contractions as tightness.  Objective:  BP (!) 149/83   Pulse 84   Temp 98.2 F (36.8 C) (Oral)   Resp 20   Ht 5\' 6"  (1.676 m)   Wt (!) 160.6 kg   LMP 03/10/2019 (Exact Date)   SpO2 100%   BMI 57.14 kg/m  Abd: gravid, ND, FHT present, mild tenderness on exam Extr: 1+ bilateral pedal edema SVE: CERVIX: 3 cm dilated, 60 effaced, -3 to -2 station  EFM: FHR: 115 bpm, variability: moderate,  accelerations:  Present,  decelerations:  Absent Toco: Frequency: Every 3-4 minutes Labs: I have reviewed the patient's lab results.   Assessment & Plan:  05/11/2019 @ [redacted]w[redacted]d, admitted for  Pregnancy and Labor/Delivery Management  1. Pain management: none. 2. FWB: FHT category I.  3. ID: GBS positive penicillin prophylaxis 4. Labor management: continue pitocin 5. Preeclampsia: continue magnesium sulfate, PO procardia protocol for severe range BP  All discussed with patient, see orders   [redacted]w[redacted]d, CNM [facility] 12/06/2019  4:23 PM

## 2019-12-06 NOTE — Progress Notes (Signed)
ECHO report reviewed.  Cardiac function appears to be appropriate. Will continue with the induction.

## 2019-12-06 NOTE — Progress Notes (Signed)
Subjective:  Comfortable but repots repeat headache after stadol  Objective:   Vitals: Blood pressure 138/78, pulse 78, temperature 98.5 F (36.9 C), temperature source Oral, resp. rate 16, height 5\' 6"  (1.676 m), weight (!) 160.6 kg, last menstrual period 03/10/2019. General:  Abdomen: Cervical Exam:  Dilation: 3 Effacement (%): 50 Cervical Position: Posterior Station: -3 Exam by:: Rosalina Dingwall  FHT: 110, moderate, no accels, no decels Toco: q1-37min  Results for orders placed or performed during the hospital encounter of 12/05/19 (from the past 24 hour(s))  CBC     Status: Abnormal   Collection Time: 12/05/19  3:19 PM  Result Value Ref Range   WBC 9.8 4.0 - 10.5 K/uL   RBC 3.43 (L) 3.87 - 5.11 MIL/uL   Hemoglobin 9.7 (L) 12.0 - 15.0 g/dL   HCT 02/04/20 (L) 86.7 - 61.9 %   MCV 86.6 80.0 - 100.0 fL   MCH 28.3 26.0 - 34.0 pg   MCHC 32.7 30.0 - 36.0 g/dL   RDW 50.9 32.6 - 71.2 %   Platelets 317 150 - 400 K/uL   nRBC 0.0 0.0 - 0.2 %  Comprehensive metabolic panel     Status: Abnormal   Collection Time: 12/05/19  3:19 PM  Result Value Ref Range   Sodium 138 135 - 145 mmol/L   Potassium 4.0 3.5 - 5.1 mmol/L   Chloride 108 98 - 111 mmol/L   CO2 21 (L) 22 - 32 mmol/L   Glucose, Bld 88 70 - 99 mg/dL   BUN 8 6 - 20 mg/dL   Creatinine, Ser 02/04/20 0.44 - 1.00 mg/dL   Calcium 8.6 (L) 8.9 - 10.3 mg/dL   Total Protein 6.4 (L) 6.5 - 8.1 g/dL   Albumin 3.0 (L) 3.5 - 5.0 g/dL   AST 18 15 - 41 U/L   ALT 11 0 - 44 U/L   Alkaline Phosphatase 123 38 - 126 U/L   Total Bilirubin 0.5 0.3 - 1.2 mg/dL   GFR calc non Af Amer >60 >60 mL/min   GFR calc Af Amer >60 >60 mL/min   Anion gap 9 5 - 15  Type and screen [facility]     Status: None   Collection Time: 12/05/19  3:25 PM  Result Value Ref Range   ABO/RH(D) A POS    Antibody Screen NEG    Sample Expiration      12/08/2019,2359 Performed at [facility], [address] [place], [place]   Protein  / creatinine ratio, urine     Status: None   Collection Time: 12/05/19  3:41 PM  Result Value Ref Range   Creatinine, Urine 270 mg/dL   Total Protein, Urine 27 mg/dL   Protein Creatinine Ratio 0.10 0.00 - 0.15 mg/mg[Cre]  Respiratory Panel by RT PCR (Flu A&B, Covid) - Nasopharyngeal Swab     Status: None   Collection Time: 12/05/19  3:41 PM   Specimen: Nasopharyngeal Swab  Result Value Ref Range   SARS Coronavirus 2 by RT PCR NEGATIVE NEGATIVE   Influenza A by PCR NEGATIVE NEGATIVE   Influenza B by PCR NEGATIVE NEGATIVE  Urine Drug Screen, Qualitative ([facility] only)     Status: None   Collection Time: 12/05/19  8:09 PM  Result Value Ref Range   Tricyclic, Ur Screen NONE DETECTED NONE DETECTED   Amphetamines, Ur Screen NONE DETECTED NONE DETECTED   MDMA (Ecstasy)Ur Screen NONE DETECTED NONE DETECTED   Cocaine Metabolite,Ur [place] NONE DETECTED NONE  DETECTED   Opiate, Ur Screen NONE DETECTED NONE DETECTED   Phencyclidine (PCP) Ur S NONE DETECTED NONE DETECTED   Cannabinoid 50 Ng, Ur [place] NONE DETECTED NONE DETECTED   Barbiturates, Ur Screen NONE DETECTED NONE DETECTED   Benzodiazepine, Ur Scrn NONE DETECTED NONE DETECTED   Methadone Scn, Ur NONE DETECTED NONE DETECTED  Brain natriuretic peptide     Status: Abnormal   Collection Time: 12/05/19  9:01 PM  Result Value Ref Range   B Natriuretic Peptide 194.0 (H) 0.0 - 100.0 pg/mL    Assessment:   39 y.o. [id number] [redacted]w[redacted]d IOL preeclampsia with severe features and cardiomegaly  Plan:   1) Labor -holding pitocin currently secondary to hypotension.  Once BP normalize will restart  2) Fetus - cat I tracing  3) Preeclampsia with severe features on presentation vs cardiomyopathy/CHTN - was started on long procardia XL 60mg  daily - BP's acutely dropped after stadol administration will discontinue - BP's are coming back up about 1.5hrs after stadol administration, half life approximately 4-hrs.   - Will hold on fluid bolus given magnesium sulfate  and cardiomegaly as concern for flash pulmonary edema.  However, if remain low will administer 543mL of NS over an hour   Malachy Mood, MD, [place] 12/06/2019, 4:39 AM

## 2019-12-06 NOTE — Progress Notes (Signed)
Patient reports gush of fluid. Pad is soaked. She has a catheter, but has leaked around her catheter per report in the middle of the night.  Ferning negative.  Plan: ROM+ for verification  Thomasene Mohair, MD, [facility], [facility] 12/06/2019 5:10 PM

## 2019-12-07 ENCOUNTER — Inpatient Hospital Stay: Payer: Medicaid Other | Admitting: Anesthesiology

## 2019-12-07 ENCOUNTER — Encounter: Payer: Self-pay | Admitting: Obstetrics and Gynecology

## 2019-12-07 DIAGNOSIS — O9902 Anemia complicating childbirth: Secondary | ICD-10-CM

## 2019-12-07 DIAGNOSIS — O1414 Severe pre-eclampsia complicating childbirth: Secondary | ICD-10-CM

## 2019-12-07 DIAGNOSIS — O99214 Obesity complicating childbirth: Secondary | ICD-10-CM

## 2019-12-07 DIAGNOSIS — O99824 Streptococcus B carrier state complicating childbirth: Secondary | ICD-10-CM

## 2019-12-07 LAB — CBC
HCT: 31.2 % — ABNORMAL LOW (ref 36.0–46.0)
Hemoglobin: 10.3 g/dL — ABNORMAL LOW (ref 12.0–15.0)
MCH: 28.7 pg (ref 26.0–34.0)
MCHC: 33 g/dL (ref 30.0–36.0)
MCV: 86.9 fL (ref 80.0–100.0)
Platelets: 310 10*3/uL (ref 150–400)
RBC: 3.59 MIL/uL — ABNORMAL LOW (ref 3.87–5.11)
RDW: 13.6 % (ref 11.5–15.5)
WBC: 12.9 10*3/uL — ABNORMAL HIGH (ref 4.0–10.5)
nRBC: 0 % (ref 0.0–0.2)

## 2019-12-07 MED ORDER — LIDOCAINE HCL (PF) 1 % IJ SOLN
INTRAMUSCULAR | Status: DC | PRN
  Administered 2019-12-07: 3 mL via SUBCUTANEOUS

## 2019-12-07 MED ORDER — LIDOCAINE-EPINEPHRINE (PF) 1.5 %-1:200000 IJ SOLN
INTRAMUSCULAR | Status: DC | PRN
  Administered 2019-12-07: 3 mL via EPIDURAL

## 2019-12-07 MED ORDER — SIMETHICONE 80 MG PO CHEW: 80.0000 mg | CHEWABLE_TABLET | ORAL | Status: DC | PRN

## 2019-12-07 MED ORDER — TETANUS-DIPHTH-ACELL PERTUSSIS 5-2.5-18.5 LF-MCG/0.5 IM SUSP
0.5000 mL | Freq: Once | INTRAMUSCULAR | Status: DC
  Filled 2019-12-07: qty 0.5

## 2019-12-07 MED ORDER — ENOXAPARIN SODIUM 80 MG/0.8ML ~~LOC~~ SOLN
80.0000 mg | SUBCUTANEOUS | Status: DC
  Administered 2019-12-08 – 2019-12-09 (×2): 80 mg via SUBCUTANEOUS
  Filled 2019-12-07 (×2): qty 0.8

## 2019-12-07 MED ORDER — FENTANYL 2.5 MCG/ML W/ROPIVACAINE 0.15% IN NS 100 ML EPIDURAL (ARMC): 12.0000 mL/h | EPIDURAL | Status: DC

## 2019-12-07 MED ORDER — PRENATAL MULTIVITAMIN CH
1.0000 | ORAL_TABLET | Freq: Every day | ORAL | Status: DC
  Administered 2019-12-07 – 2019-12-09 (×3): 1 via ORAL
  Filled 2019-12-07 (×3): qty 1

## 2019-12-07 MED ORDER — DIPHENHYDRAMINE HCL 50 MG/ML IJ SOLN: 12.5000 mg | INTRAMUSCULAR | Status: DC | PRN

## 2019-12-07 MED ORDER — AMMONIA AROMATIC IN INHA
RESPIRATORY_TRACT | Status: AC
  Filled 2019-12-07: qty 10

## 2019-12-07 MED ORDER — DIBUCAINE (PERIANAL) 1 % EX OINT: 1.0000 "application " | TOPICAL_OINTMENT | CUTANEOUS | Status: DC | PRN

## 2019-12-07 MED ORDER — OXYCODONE HCL 5 MG PO TABS
5.0000 mg | ORAL_TABLET | ORAL | Status: DC | PRN
  Administered 2019-12-07: 5 mg via ORAL
  Filled 2019-12-07: qty 1

## 2019-12-07 MED ORDER — ONDANSETRON HCL 4 MG/2ML IJ SOLN: 4.0000 mg | INTRAMUSCULAR | Status: DC | PRN

## 2019-12-07 MED ORDER — MISOPROSTOL 200 MCG PO TABS
ORAL_TABLET | ORAL | Status: AC
  Filled 2019-12-07: qty 4

## 2019-12-07 MED ORDER — SODIUM CHLORIDE 0.9 % IV SOLN
INTRAVENOUS | Status: DC | PRN
  Administered 2019-12-07 (×2): 5 mL via EPIDURAL

## 2019-12-07 MED ORDER — EPHEDRINE 5 MG/ML INJ
10.0000 mg | INTRAVENOUS | Status: DC | PRN
  Filled 2019-12-07: qty 2

## 2019-12-07 MED ORDER — PHENYLEPHRINE 40 MCG/ML (10ML) SYRINGE FOR IV PUSH (FOR BLOOD PRESSURE SUPPORT): 80.0000 ug | PREFILLED_SYRINGE | INTRAVENOUS | Status: DC | PRN

## 2019-12-07 MED ORDER — FENTANYL 2.5 MCG/ML W/ROPIVACAINE 0.15% IN NS 100 ML EPIDURAL (ARMC)
EPIDURAL | Status: AC
  Filled 2019-12-07: qty 100

## 2019-12-07 MED ORDER — SENNOSIDES-DOCUSATE SODIUM 8.6-50 MG PO TABS
2.0000 | ORAL_TABLET | ORAL | Status: DC
  Administered 2019-12-08 (×2): 2 via ORAL
  Filled 2019-12-07 (×2): qty 2

## 2019-12-07 MED ORDER — FENTANYL 2.5 MCG/ML W/ROPIVACAINE 0.15% IN NS 100 ML EPIDURAL (ARMC)
EPIDURAL | Status: DC | PRN
  Administered 2019-12-07: 12 mL/h via EPIDURAL

## 2019-12-07 MED ORDER — ONDANSETRON HCL 4 MG PO TABS: 4.0000 mg | ORAL_TABLET | ORAL | Status: DC | PRN

## 2019-12-07 MED ORDER — IBUPROFEN 600 MG PO TABS
600.0000 mg | ORAL_TABLET | Freq: Four times a day (QID) | ORAL | Status: DC
  Administered 2019-12-07 – 2019-12-08 (×5): 600 mg via ORAL
  Filled 2019-12-07 (×5): qty 1

## 2019-12-07 MED ORDER — LIDOCAINE HCL (PF) 1 % IJ SOLN
INTRAMUSCULAR | Status: AC
  Filled 2019-12-07: qty 30

## 2019-12-07 MED ORDER — LACTATED RINGERS IV SOLN: 500.0000 mL | Freq: Once | INTRAVENOUS | Status: DC

## 2019-12-07 MED ORDER — OXYTOCIN 10 UNIT/ML IJ SOLN
INTRAMUSCULAR | Status: AC
  Filled 2019-12-07: qty 2

## 2019-12-07 MED ORDER — BENZOCAINE-MENTHOL 20-0.5 % EX AERO: 1.0000 "application " | INHALATION_SPRAY | CUTANEOUS | Status: DC | PRN

## 2019-12-07 MED ORDER — COCONUT OIL OIL
1.0000 "application " | TOPICAL_OIL | Status: DC | PRN
  Filled 2019-12-07: qty 120

## 2019-12-07 MED ORDER — WITCH HAZEL-GLYCERIN EX PADS: 1.0000 "application " | MEDICATED_PAD | CUTANEOUS | Status: DC | PRN

## 2019-12-07 MED ORDER — SODIUM CHLORIDE 0.9% FLUSH: 10.0000 mL | INTRAVENOUS | Status: DC | PRN

## 2019-12-07 MED ORDER — EPHEDRINE 5 MG/ML INJ: 10.0000 mg | INTRAVENOUS | Status: DC | PRN

## 2019-12-07 NOTE — Anesthesia Procedure Notes (Signed)
Epidural Patient location during procedure: OB Start time: 12/07/2019 5:37 AM End time: 12/07/2019 5:48 AM  Staffing Anesthesiologist: Lenard Simmer, MD Performed: anesthesiologist   Preanesthetic Checklist Completed: patient identified, IV checked, site marked, risks and benefits discussed, surgical consent, monitors and equipment checked, pre-op evaluation and timeout performed  Epidural Patient position: sitting Prep: ChloraPrep Patient monitoring: heart rate, continuous pulse ox and blood pressure Approach: midline Location: L2-L3 Injection technique: LOR saline  Needle:  Needle type: Tuohy  Needle gauge: 17 G Needle length: 9 cm and 9 Needle insertion depth: 8 cm Catheter type: closed end flexible Catheter size: 19 Gauge Catheter at skin depth: 14 cm Test dose: negative and 1.5% lidocaine with Epi 1:200 K  Assessment Sensory level: T10 Events: blood not aspirated, injection not painful, no injection resistance, no paresthesia and negative IV test  Additional Notes 1st attempt Pt. Evaluated and documentation done after procedure finished. Patient identified. Risks/Benefits/Options discussed with patient including but not limited to bleeding, infection, nerve damage, paralysis, failed block, incomplete pain control, headache, blood pressure changes, nausea, vomiting, reactions to medication both or allergic, itching and postpartum back pain. Confirmed with bedside nurse the patient's most recent platelet count. Confirmed with patient that they are not currently taking any anticoagulation, have any bleeding history or any family history of bleeding disorders. Patient expressed understanding and wished to proceed. All questions were answered. Sterile technique was used throughout the entire procedure. Please see nursing notes for vital signs. Test dose was given through epidural catheter and negative prior to continuing to dose epidural or start infusion. Warning signs of high  block given to the patient including shortness of breath, tingling/numbness in hands, complete motor block, or any concerning symptoms with instructions to call for help. Patient was given instructions on fall risk and not to get out of bed. All questions and concerns addressed with instructions to call with any issues or inadequate analgesia.   Patient tolerated the insertion well without immediate complications.Reason for block:procedure for pain

## 2019-12-07 NOTE — Progress Notes (Signed)
PROGRESS NOTE    Ashley Herring  EGB:[id number] DOB: 09/10/1980 DOA: 12/05/2019 PCP: Hubbard Hartshorn, FNP    Brief Narrative:  Ashley Herring is an 39 y.o. female at [redacted] weeks gestation, admitted to the [facility] service for scheduled NST, who I was asked to see in consultation for management of elevated blood pressures with systolics in the 371G to 626R and as high as 190.  Patient denies symptoms except for mild headache.  She denies chest pain or shortness of breath.  She has only minimal swelling of the bilateral lower extremities, not unusual at her stage of pregnancy.      Consultants:    Procedures: echo  Antimicrobials:   pcn   Subjective: S/p delivery. No HA or any other complaints today.  Objective: Vitals:   12/07/19 1445 12/07/19 1450 12/07/19 1455 12/07/19 1500  BP:      Pulse:      Resp:      Temp:      TempSrc:      SpO2: 98% 99% 98% 99%  Weight:      Height:        Intake/Output Summary (Last 24 hours) at 12/07/2019 1544 Last data filed at 12/07/2019 1500 Gross per 24 hour  Intake 5365.56 ml  Output 3532 ml  Net 1833.56 ml   Filed Weights   12/05/19 1819  Weight: (!) 160.6 kg    Examination:  General exam: Appears calm and comfortable  Respiratory system: Clear to auscultation. Respiratory effort normal. Cardiovascular system: S1 & S2 heard, RRR. No JVD, murmurs, rubs, gallops or clicks.  Gastrointestinal system: Abdomen is nondistended, soft and nontender.  Normal bowel sounds heard. Central nervous system: Alert and oriented. Grossly intact Extremities:mild dema b/l Skin: Warm dry Psychiatry: Judgement and insight appear normal. Mood & affect appropriate.     Data Reviewed: I have personally reviewed following labs and imaging studies  CBC: Recent Labs  Lab 12/05/19 1519 12/07/19 1250  WBC 9.8 12.9*  HGB 9.7* 10.3*  HCT 29.7* 31.2*  MCV 86.6 86.9  PLT 317 485   Basic Metabolic Panel: Recent Labs  Lab 12/05/19 1519  NA 138  K 4.0    CL 108  CO2 21*  GLUCOSE 88  BUN 8  CREATININE 0.52  CALCIUM 8.6*   GFR: Estimated Creatinine Clearance: 151.7 mL/min (by C-G formula based on SCr of 0.52 mg/dL). Liver Function Tests: Recent Labs  Lab 12/05/19 1519  AST 18  ALT 11  ALKPHOS 123  BILITOT 0.5  PROT 6.4*  ALBUMIN 3.0*   No results for input(s): LIPASE, AMYLASE in the last 168 hours. No results for input(s): AMMONIA in the last 168 hours. Coagulation Profile: No results for input(s): INR, PROTIME in the last 168 hours. Cardiac Enzymes: No results for input(s): CKTOTAL, CKMB, CKMBINDEX, TROPONINI in the last 168 hours. BNP (last 3 results) No results for input(s): PROBNP in the last 8760 hours. HbA1C: No results for input(s): HGBA1C in the last 72 hours. CBG: No results for input(s): GLUCAP in the last 168 hours. Lipid Profile: No results for input(s): CHOL, HDL, LDLCALC, TRIG, CHOLHDL, LDLDIRECT in the last 72 hours. Thyroid Function Tests: No results for input(s): TSH, T4TOTAL, FREET4, T3FREE, THYROIDAB in the last 72 hours. Anemia Panel: No results for input(s): VITAMINB12, FOLATE, FERRITIN, TIBC, IRON, RETICCTPCT in the last 72 hours. Sepsis Labs: No results for input(s): PROCALCITON, LATICACIDVEN in the last 168 hours.  Recent Results (from the past 240 hour(s))  Strep Gp B  NAA     Status: Abnormal   Collection Time: 11/28/19  4:22 PM   Specimen: Vaginal/Rectal; Sterile Swab   VR  Result Value Ref Range Status   Strep Gp B NAA Positive (A) Negative Final    Comment: Centers for Disease Control and Prevention (CDC) and American Congress of Obstetricians and Gynecologists (ACOG) guidelines for prevention of perinatal group B streptococcal (GBS) disease specify co-collection of a vaginal and rectal swab specimen to maximize sensitivity of GBS detection. Per the CDC and ACOG, swabbing both the lower vagina and rectum substantially increases the yield of detection compared with sampling the vagina  alone. Penicillin G, ampicillin, or cefazolin are indicated for intrapartum prophylaxis of perinatal GBS colonization. Reflex susceptibility testing should be performed prior to use of clindamycin only on GBS isolates from penicillin-allergic women who are considered a high risk for anaphylaxis. Treatment with vancomycin without additional testing is warranted if resistance to clindamycin is noted.   Respiratory Panel by RT PCR (Flu A&B, Covid) - Nasopharyngeal Swab     Status: None   Collection Time: 12/05/19  3:41 PM   Specimen: Nasopharyngeal Swab  Result Value Ref Range Status   SARS Coronavirus 2 by RT PCR NEGATIVE NEGATIVE Final    Comment: (NOTE) SARS-CoV-2 target nucleic acids are NOT DETECTED. The SARS-CoV-2 RNA is generally detectable in upper respiratoy specimens during the acute phase of infection. The lowest concentration of SARS-CoV-2 viral copies this assay can detect is 131 copies/mL. A negative result does not preclude SARS-Cov-2 infection and should not be used as the sole basis for treatment or other patient management decisions. A negative result may occur with  improper specimen collection/handling, submission of specimen other than nasopharyngeal swab, presence of viral mutation(s) within the areas targeted by this assay, and inadequate number of viral copies (<131 copies/mL). A negative result must be combined with clinical observations, patient history, and epidemiological information. The expected result is Negative. Fact Sheet for Patients:  https://www.moore.com/https://www.fda.gov/media/142436/download Fact Sheet for Healthcare Providers:  https://www.young.biz/https://www.fda.gov/media/142435/download This test is not yet ap proved or cleared by the [place] FDA and  has been authorized for detection and/or diagnosis of SARS-CoV-2 by FDA under an Emergency Use Authorization (EUA). This EUA will remain  in effect (meaning this test can be used) for the duration of the COVID-19 declaration  under Section 564(b)(1) of the Act, 21 U.S.C. section 360bbb-3(b)(1), unless the authorization is terminated or revoked sooner.    Influenza A by PCR NEGATIVE NEGATIVE Final   Influenza B by PCR NEGATIVE NEGATIVE Final    Comment: (NOTE) The Xpert Xpress SARS-CoV-2/FLU/RSV assay is intended as an aid in  the diagnosis of influenza from Nasopharyngeal swab specimens and  should not be used as a sole basis for treatment. Nasal washings and  aspirates are unacceptable for Xpert Xpress SARS-CoV-2/FLU/RSV  testing. Fact Sheet for Patients: https://www.moore.com/https://www.fda.gov/media/142436/download Fact Sheet for Healthcare Providers: https://www.young.biz/https://www.fda.gov/media/142435/download This test is not yet approved or cleared by the [place] FDA and  has been authorized for detection and/or diagnosis of SARS-CoV-2 by  FDA under an Emergency Use Authorization (EUA). This EUA will remain  in effect (meaning this test can be used) for the duration of the  Covid-19 declaration under Section 564(b)(1) of the Act, 21  U.S.C. section 360bbb-3(b)(1), unless the authorization is  terminated or revoked. Performed at [facility], [address] [place], [place] [id number]          Radiology Studies: [facility] Chest [place] 1 [address]View  Result Date: 12/05/2019 CLINICAL DATA:  Cardiomegaly EXAM: PORTABLE CHEST 1 VIEW COMPARISON:  03/17/2019 FINDINGS: Cardiomegaly. Both lungs are clear. The visualized skeletal structures are unremarkable. IMPRESSION: Cardiomegaly without acute abnormality of the lungs in AP portable projection. Electronically Signed   By: Lauralyn Primes M.D.   On: 12/05/2019 20:20   ECHOCARDIOGRAM COMPLETE  Result Date: 12/06/2019    ECHOCARDIOGRAM REPORT   Patient Name:   Ashley Herring Date of Exam: 12/06/2019 Medical Rec #:  [id number]        Height:       66.0 in Accession #:    [id number]       Weight:       354.0 lb Date of Birth:  [birth year]         BSA:          2.549 m Patient Age:    38 years          BP:           132/75 mmHg Patient Gender: F                HR:           78 bpm. Exam Location:  [facility] Procedure: 2D Echo Indications:     Cardiomegaly 429.3/ I51.7  History:         Patient has no prior history of Echocardiogram examinations.  Sonographer:     Wonda Cerise RDCS Referring Phys:  [id number] ANDREAS STAEBLER Diagnosing Phys: Adrian Blackwater MD IMPRESSIONS  1. Left ventricular ejection fraction, by estimation, is 60 to 65%. The left ventricle has normal function. The left ventricle has no regional wall motion abnormalities. Left ventricular diastolic parameters are consistent with Grade I diastolic dysfunction (impaired relaxation).  2. Right ventricular systolic function is normal. The right ventricular size is normal.  3. Left atrial size was mildly dilated.  4. The mitral valve is normal in structure. Trivial mitral valve regurgitation. No evidence of mitral stenosis.  5. The aortic valve is normal in structure. Aortic valve regurgitation is not visualized. No aortic stenosis is present.  6. The inferior vena cava is normal in size with greater than 50% respiratory variability, suggesting right atrial pressure of 3 mmHg. FINDINGS  Left Ventricle: Left ventricular ejection fraction, by estimation, is 60 to 65%. The left ventricle has normal function. The left ventricle has no regional wall motion abnormalities. The left ventricular internal cavity size was normal in size. There is  no left ventricular hypertrophy. Left ventricular diastolic parameters are consistent with Grade I diastolic dysfunction (impaired relaxation). Right Ventricle: The right ventricular size is normal. No increase in right ventricular wall thickness. Right ventricular systolic function is normal. Left Atrium: Left atrial size was mildly dilated. Right Atrium: Right atrial size was normal in size. Pericardium: There is no evidence of pericardial effusion. Mitral Valve: The mitral valve is normal in structure. Normal mobility of the  mitral valve leaflets. Trivial mitral valve regurgitation. No evidence of mitral valve stenosis. Tricuspid Valve: The tricuspid valve is normal in structure. Tricuspid valve regurgitation is trivial. No evidence of tricuspid stenosis. Aortic Valve: The aortic valve is normal in structure. Aortic valve regurgitation is not visualized. No aortic stenosis is present. Aortic valve peak gradient measures 11.8 mmHg. Pulmonic Valve: The pulmonic valve was normal in structure. Pulmonic valve regurgitation is trivial. No evidence of pulmonic stenosis. Aorta: The aortic root is normal in size and structure. Venous: The inferior vena cava is normal in size with greater  than 50% respiratory variability, suggesting right atrial pressure of 3 mmHg. IAS/Shunts: No atrial level shunt detected by color flow Doppler.  LEFT VENTRICLE PLAX 2D LVIDd:         5.50 cm      Diastology LVIDs:         3.77 cm      LV e' lateral:   12.00 cm/s LV PW:         1.15 cm      LV E/e' lateral: 7.9 LV IVS:        1.08 cm      LV e' medial:    6.96 cm/s LVOT diam:     2.10 cm      LV E/e' medial:  13.7 LV SV:         97 LV SV Index:   38 LVOT Area:     3.46 cm  LV Volumes (MOD) LV vol d, MOD A4C: 124.0 ml LV vol s, MOD A4C: 50.6 ml LV SV MOD A4C:     124.0 ml RIGHT VENTRICLE RV Basal diam:  3.77 cm RV S prime:     17.00 cm/s TAPSE (M-mode): 3.8 cm LEFT ATRIUM             Index       RIGHT ATRIUM           Index LA diam:        4.50 cm 1.77 cm/m  RA Area:     23.10 cm LA Vol (A2C):   51.3 ml 20.13 ml/m RA Volume:   79.50 ml  31.19 ml/m LA Vol (A4C):   89.7 ml 35.19 ml/m LA Biplane Vol: 74.0 ml 29.03 ml/m  AORTIC VALVE                PULMONIC VALVE AV Area (Vmax): 2.38 cm    PV Vmax:       1.29 m/s AV Vmax:        172.00 cm/s PV Peak grad:  6.7 mmHg AV Peak Grad:   11.8 mmHg LVOT Vmax:      118.00 cm/s LVOT Vmean:     81.900 cm/s LVOT VTI:       0.279 m  AORTA Ao Root diam: 2.70 cm Ao Asc diam:  2.90 cm MITRAL VALVE               TRICUSPID VALVE  MV Area (PHT): 4.60 cm    TV Peak grad:   27.3 mmHg MV Decel Time: 165 msec    TV Vmax:        2.61 m/s MV E velocity: 95.10 cm/s MV A velocity: 83.40 cm/s  SHUNTS MV E/A ratio:  1.14        Systemic VTI:  0.28 m                            Systemic Diam: 2.10 cm Adrian Blackwater MD Electronically signed by Adrian Blackwater MD Signature Date/Time: 12/06/2019/11:33:16 AM    Final         Scheduled Meds: . Melene Muller ON 12/08/2019] enoxaparin (LOVENOX) injection  80 mg Subcutaneous Q24H  . ibuprofen  600 mg Oral Q6H  . lidocaine (PF)      . NIFEdipine  90 mg Oral Daily  . oxytocin 40 units in LR 1000 mL  500 mL Intravenous Once  . prenatal multivitamin  1 tablet Oral Q1200  . [START ON 12/08/2019] senna-docusate  2 tablet Oral Q24H  . [START ON 12/08/2019] Tdap  0.5 mL Intramuscular Once   Continuous Infusions: . lactated ringers Stopped (12/07/19 0610)  . magnesium sulfate Stopped (12/07/19 1358)  . oxytocin Stopped (12/07/19 1359)    Assessment & Plan:   Active Problems:   Severe preeclampsia, third trimester   Postpartum care following vaginal delivery  Severe preeclampsia, third trimester -Diagnosis of severe preeclampsia made by Dr. Bonney Aid based on blood pressure criteria -Liver enzymes and platelet counts within normal limits S/p delivery now. Procardia was increased to 90 for better bp control Echo nml EF.  primary management by OBGYN  Suspect Chronic Hypertension -Suspect undiagnosed chronic hypertension based on review of ED visits as far back as July 2016 with recorded BP during an ER visit of 164/97, 141/74 July 2018, 168/118 may 2019 -Consider discharging patient on antihypertensives if systolic blood pressure not consistently below 140, or for the least having close outpatient PCP follow-up for continued monitoring 4/4: bp now 126/51 after procardia increased to 90 mg daily.  Can continue on this.  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 BNP slightly elevated at 195. -She at this time is asymptomatic for cardiomyopathy . However echo with normal EF. Will need hypertension management as outpatient, can consider follow-up with a cardiologist upon discharge    We will sign off at this time as the blood pressure remains more stable.  If any issues or questions arise please do not hesitate to contact the hospitalist service.       LOS: 2 days   Time spent: 45 minutes with more than 50% COC    Lynn Ito, MD [facility] Pager [phone]  If 7PM-7AM, please contact night-coverage [email or web address] Password [facility] 12/07/2019, 3:44 PM Patient ID: Ashley Herring, female   DOB: 1981/08/11, 39 y.o.   MRN: [id number]

## 2019-12-07 NOTE — Discharge Summary (Signed)
OB Discharge Summary     Patient Name: Ashley Herring DOB: 01-18-1981 MRN: [id number]  Date of admission: 12/05/2019 Delivering provider: Rod Can, CNM  Date of Delivery: 12/07/2019  Date of discharge: 12/09/2019  Admitting diagnosis: Severe preeclampsia, third trimester [O14.13] Intrauterine pregnancy: [redacted]w[redacted]d     Secondary diagnosis: None     Discharge diagnosis: Term Pregnancy Delivered and Preeclampsia (severe)                                                                                                Post partum procedures: postpartum magnesium 24 hours  Augmentation: Pitocin and Cytotec  Complications: None  Hospital course:  Induction of Labor With Vaginal Delivery   39 y.o. yo [id number] at [redacted]w[redacted]d was admitted to the hospital 12/05/2019 for induction of labor.  Indication for induction: Preeclampsia.  Patient had an uncomplicated labor course as follows: Membrane Rupture Time/Date: as head was delivering Patient had delivery of viable female 5:58 AM, 12/07/19  Details of delivery can be found in separate delivery note.    Patient had a routine postpartum course. Procardia used to help control blood pressures.  Plan to continue short term.  Blood pressure check up appt in 48 hours.  Also to continue Lovenox for prevention of DVT, for 6 weeks.  Plans IUD for [facility].  Patient is discharged home 12/09/19.  Physical exam  BP (!) 148/85 (BP Location: Left Wrist)   Pulse 85   Temp 98.2 F (36.8 C) (Oral)   Resp 20   Ht 5\' 6"  (1.676 m)   Wt (!) 153.3 kg   LMP 03/10/2019 (Exact Date)   SpO2 99%   Breastfeeding Unknown   BMI 54.55 kg/m   General: alert, cooperative and no distress Lochia: appropriate Uterine Fundus: firm Incision: N/A DVT Evaluation: No evidence of DVT seen on physical exam.  Labs: Lab Results  Component Value Date   WBC 10.3 12/08/2019   HGB 8.6 (L) 12/08/2019   HCT 25.7 (L) 12/08/2019   MCV 85.1 12/08/2019   PLT 269 12/08/2019    Discharge  instruction: per After Visit Summary.  Medications:  Allergies as of 12/09/2019      Reactions   Peanuts [peanut Oil] Swelling      Medication List    TAKE these medications   enoxaparin 80 MG/0.8ML injection Commonly known as: LOVENOX Inject 0.8 mLs (80 mg total) into the skin daily. Start taking on: December 09, 2019   ferrous sulfate 325 (65 FE) MG tablet Commonly known as: FerrouSul Take 1 tablet (325 mg total) by mouth daily with breakfast.   NIFEdipine 90 MG 24 hr tablet Commonly known as: PROCARDIA XL/NIFEDICAL-XL Take 1 tablet (90 mg total) by mouth daily.   PRENATAL VITAMINS PO Take 1 tablet by mouth 1 day or 1 dose.       Diet: No evidence of DVT seen on physical exam.  Activity: Advance as tolerated. Pelvic rest for 6 weeks.   Outpatient follow up: Follow-up Information    Rod Can, CNM. Schedule an appointment as soon as possible for a visit in 6 week(s).   Specialty:  Obstetrics Why: postpartum follow up visit and Mirena IUD insertion Contact information: [address] [place] [address] [phone]        [facility]. Go on 12/11/2019.   Why: **[place] [facility]!!**  BP check @ 10:30 am Contact information: [address] [place] [address] [phone]            Postpartum contraception: IUD Mirena Rhogam Given postpartum: NA Rubella vaccine given postpartum: Immune Varicella vaccine given postpartum: Immune TDaP given antepartum or postpartum: given antepartum  Newborn Data: Live born female Sheria Lang Birth Weight: 3150 g, 6 pounds 15 ounces APGAR: 8, 9  Newborn Delivery   Birth date/time: 12/07/2019 05:58:00 Delivery type: Vaginal, Spontaneous       Baby Feeding: Formula and pumping/Breast milk  Disposition:home with mother  SIGNED:  Letitia Libra, MD 12/09/2019 3:42 PM

## 2019-12-07 NOTE — Lactation Note (Signed)
This note was copied from a baby's chart. Lactation Consultation Note  Patient Name: Boy Sada Mazzoni ENIDP'O Date: 12/07/2019   Mom is in [place] on Mag for preeclampsia.  Mom's feeding choice is to pump and bottle feed giving formula until can pump colostrum.  Mom has large breasts.  Set up Symphony pump with #27 flanges working better.  Coconut oil applied inside of flange to get better seal and make it more comfortable for mom.  LC had to hold flanges in place d/t position of IV causing it to go off when she would bend her arm.  Mom got a little colostrum in flanges which she rubbed on her nipples to prevent bacteria, lubricate and for comfort.  Reviewed and demonstrated breast massage, hand expression, pumping, collection, storage, cleaning, labeling and handling of expressed milk.  Encouraged mom to pump around feeding times while FOB was feeding baby or around 8 to 12 times in 24 hour period.  Explained normal newborn stomach size, supply and demand and normal course of lactation. Mom may need reinforcement b/c she was so tired. Praised mom for her commitment to pump to supply breast milk for [employer].  Hand out given on formula preparation and reviewed.  Encouraged mom to call [facility] with any questions, concerns or if assistance needed.  Maternal Data    Feeding Feeding Type: Bottle Fed - Formula Nipple Type: Regular  LATCH Score                   Interventions    Lactation Tools Discussed/Used     Consult Status      Louis Meckel 12/07/2019, 8:41 PM

## 2019-12-07 NOTE — Anesthesia Preprocedure Evaluation (Signed)
Anesthesia Evaluation  Patient identified by MRN, date of birth, ID band Patient awake    Reviewed: Allergy & Precautions, H&P , NPO status , Patient's Chart, lab work & pertinent test results, reviewed documented beta blocker date and time   History of Anesthesia Complications Negative for: history of anesthetic complications  Airway Mallampati: II  TM Distance: >3 FB Neck ROM: full    Dental  (+) Partial Upper, Dental Advidsory Given   Pulmonary neg pulmonary ROS, former smoker,    Pulmonary exam normal breath sounds clear to auscultation       Cardiovascular Exercise Tolerance: Good hypertension, On Medications (-) anginaNormal cardiovascular exam(-) dysrhythmias (-) Valvular Problems/Murmurs Rhythm:regular Rate:Normal     Neuro/Psych negative neurological ROS  negative psych ROS   GI/Hepatic Neg liver ROS, GERD  ,  Endo/Other  neg diabetesMorbid obesity  Renal/GU negative Renal ROS  negative genitourinary   Musculoskeletal   Abdominal   Peds  Hematology negative hematology ROS (+)   Anesthesia Other Findings Past Medical History: No date: Back pain No date: Eye inflammation No date: Miscarriage No date: Sciatica   Reproductive/Obstetrics (+) Pregnancy                             Anesthesia Physical Anesthesia Plan  ASA: III  Anesthesia Plan: Epidural   Post-op Pain Management:    Induction:   PONV Risk Score and Plan:   Airway Management Planned:   Additional Equipment:   Intra-op Plan:   Post-operative Plan:   Informed Consent: I have reviewed the patients History and Physical, chart, labs and discussed the procedure including the risks, benefits and alternatives for the proposed anesthesia with the patient or authorized representative who has indicated his/her understanding and acceptance.     Dental Advisory Given  Plan Discussed with: Anesthesiologist,  CRNA and Surgeon  Anesthesia Plan Comments:         Anesthesia Quick Evaluation

## 2019-12-08 LAB — CREATININE, SERUM
Creatinine, Ser: 0.6 mg/dL (ref 0.44–1.00)
GFR calc Af Amer: >60 mL/min (ref >60)
GFR calc non Af Amer: >60 mL/min (ref >60)

## 2019-12-08 LAB — CBC
HCT: 25.7 % — ABNORMAL LOW (ref 36.0–46.0)
Hemoglobin: 8.6 g/dL — ABNORMAL LOW (ref 12.0–15.0)
MCH: 28.5 pg (ref 26.0–34.0)
MCHC: 33.5 g/dL (ref 30.0–36.0)
MCV: 85.1 fL (ref 80.0–100.0)
Platelets: 269 10*3/uL (ref 150–400)
RBC: 3.02 MIL/uL — ABNORMAL LOW (ref 3.87–5.11)
RDW: 13.7 % (ref 11.5–15.5)
WBC: 10.3 10*3/uL (ref 4.0–10.5)
nRBC: 0 % (ref 0.0–0.2)

## 2019-12-08 MED ORDER — FERROUS SULFATE 325 (65 FE) MG PO TABS
325.0000 mg | ORAL_TABLET | Freq: Two times a day (BID) | ORAL | Status: DC
  Administered 2019-12-08 – 2019-12-09 (×2): 325 mg via ORAL
  Filled 2019-12-08 (×2): qty 1

## 2019-12-08 MED ORDER — IBUPROFEN 600 MG PO TABS
600.0000 mg | ORAL_TABLET | Freq: Four times a day (QID) | ORAL | Status: DC
  Administered 2019-12-08 – 2019-12-09 (×3): 600 mg via ORAL
  Filled 2019-12-08 (×3): qty 1

## 2019-12-08 NOTE — Progress Notes (Addendum)
Post Partum Day 1 Preeclampsia with severe features.  Subjective: Has a dull headache and some blurred vision. Tolerating a regular diet. Bleeding is light Magnesium sulfate discontinued after 24 hours at 0600 this AM. Transferred to the [facility] unit after that.  Objective: Blood pressure (!) 151/82, pulse 87, temperature 98.3 F (36.8 C), temperature source Oral, resp. rate 18, height 5\' 6"  (1.676 m), weight (!) 160.6 kg, last menstrual period 03/10/2019, SpO2 100 %, unknown if currently breastfeeding.  Patient Vitals for the past 24 hrs:  BP Temp Temp src Pulse Resp SpO2  12/08/19 1216 (!) 151/82 98.3 F (36.8 C) Oral 87 18 100 %  12/08/19 0930 (!) 146/82 98.7 F (37.1 C) Oral 95 18 100 %  12/08/19 0816 137/76 98.3 F (36.8 C) Oral 92 -- --  12/08/19 0646 (!) 146/86 -- -- 90 -- --  12/08/19 0616 (!) 143/74 -- -- 88 -- --  12/08/19 0601 (!) 149/74 98.3 F (36.8 C) Oral 88 18 99 %  12/08/19 0600 -- -- -- -- -- 99 %  12/08/19 0555 -- -- -- -- -- 100 %  12/08/19 0550 -- -- -- -- -- 99 %  12/08/19 0545 -- -- -- -- -- 99 %  12/08/19 0540 -- -- -- -- -- 99 %  12/08/19 0535 -- -- -- -- -- 99 %  12/08/19 0530 -- -- -- -- -- 100 %  12/08/19 0525 -- -- -- -- -- 99 %  12/08/19 0520 -- -- -- -- -- 98 %  12/08/19 0515 -- -- -- -- -- 99 %  12/08/19 0510 -- -- -- -- -- 99 %  12/08/19 0505 -- -- -- -- -- 100 %  12/08/19 0501 (!) 146/69 -- -- 91 -- --  12/08/19 0500 -- -- -- -- -- 100 %  12/08/19 0455 -- -- -- -- -- 99 %  12/08/19 0450 -- -- -- -- -- 100 %  12/08/19 0445 -- -- -- -- -- 100 %  12/08/19 0440 -- -- -- -- -- 100 %  12/08/19 0435 -- -- -- -- -- 99 %  12/08/19 0430 -- -- -- -- -- 100 %  12/08/19 0425 -- -- -- -- -- 100 %  12/08/19 0420 -- -- -- -- -- 99 %  12/08/19 0415 -- -- -- -- -- 100 %  12/08/19 0410 -- -- -- -- -- 100 %  12/08/19 0405 -- -- -- -- -- 100 %  12/08/19 0401 138/80 98.9 F (37.2 C) Oral 86 20 100 %  12/08/19 0400 -- -- -- -- -- 99 %  12/08/19 0355 -- -- -- --  -- 99 %  12/08/19 0350 -- -- -- -- -- 99 %  12/08/19 0345 -- -- -- -- -- 98 %  12/08/19 0340 -- -- -- -- -- 99 %  12/08/19 0335 -- -- -- -- -- 99 %  12/08/19 0330 -- -- -- -- -- 99 %  12/08/19 0325 -- -- -- -- -- 99 %  12/08/19 0320 -- -- -- -- -- 99 %  12/08/19 0315 -- -- -- -- -- 99 %  12/08/19 0310 -- -- -- -- -- 99 %  12/08/19 0305 -- -- -- -- -- 99 %  12/08/19 0301 (!) 142/77 -- -- 67 18 99 %  12/08/19 0300 -- -- -- -- -- 98 %  12/08/19 0255 -- -- -- -- -- 98 %  12/08/19 0250 -- -- -- -- -- 98 %  12/08/19 0245 -- -- -- -- --  99 %  12/08/19 0240 -- -- -- -- -- 99 %  12/08/19 0235 -- -- -- -- -- 98 %  12/08/19 0230 -- -- -- -- -- 99 %  12/08/19 0225 -- -- -- -- -- 98 %  12/08/19 0220 -- -- -- -- -- 98 %  12/08/19 0215 -- -- -- -- -- 98 %  12/08/19 0210 -- -- -- -- -- 97 %  12/08/19 0205 -- -- -- -- -- 97 %  12/08/19 0201 140/75 -- -- 81 18 98 %  12/08/19 0200 -- -- -- -- -- 97 %  12/08/19 0155 -- -- -- -- -- 97 %  12/08/19 0150 -- -- -- -- -- 97 %  12/08/19 0145 -- -- -- -- -- 98 %  12/08/19 0140 -- -- -- -- -- 99 %  12/08/19 0135 -- -- -- -- -- 98 %  12/08/19 0130 -- -- -- -- -- 99 %  12/08/19 0125 -- -- -- -- -- 99 %  12/08/19 0120 -- -- -- -- -- 99 %  12/08/19 0115 -- -- -- -- -- 99 %  12/08/19 0110 -- -- -- -- -- 98 %  12/08/19 0105 -- -- -- -- -- 99 %  12/08/19 0101 (!) 148/85 98.8 F (37.1 C) Oral 82 17 99 %  12/08/19 0100 -- -- -- -- -- 99 %  12/08/19 0055 -- -- -- -- -- 99 %  12/08/19 0050 -- -- -- -- -- 100 %  12/08/19 0045 -- -- -- -- -- 99 %  12/08/19 0040 -- -- -- -- -- 99 %  12/08/19 0035 -- -- -- -- -- 100 %  12/08/19 0030 -- -- -- -- -- 100 %  12/08/19 0025 -- -- -- -- -- 100 %  12/08/19 0020 -- -- -- -- -- 100 %  12/08/19 0015 -- -- -- -- -- 100 %  12/08/19 0005 -- -- -- -- -- 100 %  12/08/19 0001 127/67 -- -- 84 20 100 %  12/08/19 0000 -- -- -- -- -- 100 %  12/07/19 2356 -- -- -- -- -- 100 %  12/07/19 2355 -- -- -- -- -- 100 %  12/07/19 2350  -- -- -- -- -- 99 %  12/07/19 2345 -- -- -- -- -- 100 %  12/07/19 2340 -- -- -- -- -- 100 %  12/07/19 2335 -- -- -- -- -- 100 %  12/07/19 2330 -- -- -- -- -- 100 %  12/07/19 2325 -- -- -- -- -- 99 %  12/07/19 2320 -- -- -- -- -- 100 %  12/07/19 2315 -- -- -- -- -- 99 %  12/07/19 2310 -- -- -- -- -- 99 %  12/07/19 2301 131/76 -- -- 91 18 99 %  12/07/19 2300 -- -- -- -- -- 98 %  12/07/19 2255 -- -- -- -- -- 98 %  12/07/19 2250 -- -- -- -- -- 98 %  12/07/19 2245 -- -- -- -- -- 98 %  12/07/19 2240 -- -- -- -- -- 96 %  12/07/19 2235 -- -- -- -- -- 97 %  12/07/19 2230 -- -- -- -- -- 98 %  12/07/19 2225 -- -- -- -- -- 98 %  12/07/19 2220 -- -- -- -- -- 97 %  12/07/19 2219 (!) 154/80 -- -- 82 -- --  12/07/19 2215 -- -- -- -- -- 97 %  12/07/19 2210 -- -- -- -- -- 98 %  12/07/19 2205 -- -- -- -- --  99 %  12/07/19 2200 (!) 145/67 -- -- 76 20 96 %  12/07/19 2155 -- -- -- -- -- 96 %  12/07/19 2150 -- -- -- -- -- 96 %  12/07/19 2145 -- -- -- -- -- 96 %  12/07/19 2140 -- -- -- -- -- 96 %  12/07/19 2135 -- -- -- -- -- 96 %  12/07/19 2130 -- -- -- -- -- 99 %  12/07/19 2125 -- -- -- -- -- 99 %  12/07/19 2120 -- -- -- -- -- 100 %  12/07/19 2118 (!) 144/75 -- -- 83 -- --  12/07/19 2115 -- -- -- -- -- 100 %  12/07/19 2110 -- -- -- -- -- 99 %  12/07/19 2105 -- -- -- -- -- 100 %  12/07/19 2102 (!) 161/68 -- -- 90 17 100 %  12/07/19 2100 -- -- -- -- -- 100 %  12/07/19 2055 -- -- -- -- -- 99 %  12/07/19 2050 -- -- -- -- -- 99 %  12/07/19 2045 -- -- -- -- -- 98 %  12/07/19 2040 -- -- -- -- -- 98 %  12/07/19 2035 -- -- -- -- -- 98 %  12/07/19 2030 -- -- -- -- -- 98 %  12/07/19 2025 -- -- -- -- -- 98 %  12/07/19 2020 -- -- -- -- -- 99 %  12/07/19 2004 (!) 141/65 -- -- 95 -- --  12/07/19 2000 (!) 141/65 98.9 F (37.2 C) Oral 95 18 100 %  12/07/19 1955 -- -- -- -- -- 100 %  12/07/19 1950 -- -- -- -- -- 99 %  12/07/19 1945 -- -- -- -- -- 99 %  12/07/19 1940 -- -- -- -- -- 98 %  12/07/19 1935 --  -- -- -- -- 99 %  12/07/19 1930 -- -- -- -- -- 100 %  12/07/19 1925 -- -- -- -- -- 100 %  12/07/19 1920 -- -- -- -- -- 100 %  12/07/19 1915 -- -- -- -- -- 100 %  12/07/19 1910 -- -- -- -- -- 100 %  12/07/19 1909 (!) 157/82 -- -- 89 -- --  12/07/19 1905 -- -- -- -- -- 99 %  12/07/19 1900 -- -- -- -- -- 99 %  12/07/19 1855 -- -- -- -- -- 99 %  12/07/19 1850 -- -- -- -- -- 99 %  12/07/19 1845 -- -- -- -- -- 99 %  12/07/19 1840 -- -- -- -- -- 99 %  12/07/19 1835 -- -- -- -- -- 99 %  12/07/19 1830 -- -- -- -- -- 99 %  12/07/19 1825 -- -- -- -- -- 100 %  12/07/19 1820 -- -- -- -- -- 100 %  12/07/19 1815 -- -- -- -- -- 100 %  12/07/19 1810 -- -- -- -- -- 99 %  12/07/19 1805 -- -- -- -- -- 100 %  12/07/19 1802 (!) 147/90 -- -- 86 -- --  12/07/19 1800 -- -- -- -- -- 99 %  12/07/19 1755 -- -- -- -- -- 100 %  12/07/19 1750 -- -- -- -- -- 99 %  12/07/19 1745 -- -- -- -- -- 100 %  12/07/19 1740 -- -- -- -- -- 100 %  12/07/19 1735 -- -- -- -- -- 99 %  12/07/19 1730 -- -- -- -- -- 99 %  12/07/19 1725 -- -- -- -- -- 100 %  12/07/19 1720 -- -- -- -- -- 100 %  12/07/19 1715 -- -- -- -- -- 100 %  12/07/19 1710 -- -- -- -- -- 100 %  12/07/19 1705 -- -- -- -- -- 100 %  12/07/19 1701 (!) 150/91 -- -- 89 -- --  12/07/19 1700 -- -- -- -- 20 100 %  12/07/19 1655 -- -- -- -- -- 99 %  12/07/19 1650 -- -- -- -- -- 100 %  12/07/19 1645 -- -- -- -- -- 100 %  12/07/19 1640 -- -- -- -- -- 100 %  12/07/19 1635 -- -- -- -- -- 100 %  12/07/19 1630 -- -- -- -- -- 100 %  12/07/19 1629 (!) 152/92 -- -- 89 -- --  12/07/19 1600 -- 98.8 F (37.1 C) Oral -- -- --  12/07/19 1503 (!) 144/68 -- -- 99 -- --  12/07/19 1500 -- -- -- -- -- 99 %  12/07/19 1455 -- -- -- -- -- 98 %  12/07/19 1450 -- -- -- -- -- 99 %    Urine output: was diuresing prior to foley being discontinued. Has voised x 2 since foley out.  Physical Exam:  General: alert, cooperative and no distress, pleasant Heart: RRR with Grade I-II/VI  systolic murmur best heard at pulmonic area Lungs: normal respiratory effort, CTAB Lochia: appropriate Uterine Fundus: firm/ U-1/ML/NT DVT Evaluation: No evidence of DVT seen on physical exam.  Recent Labs    12/07/19 1250 12/08/19 0018  HGB 10.3* 8.6*  HCT 31.2* 25.7*  WBC 12.9* 10.3  PLT 310 269    Assessment/Plan: Stable PPD #1-continue postpartum care  A POS/ RI/ VI  TDAP given 2/11  Bottle  Contraception: IUD Anemia, chronic , worsened with blood loss-hemodynamically stable  Vitamins and iron supplements Preeclampsia with severe features: most blood pressures in the mild range on Procardia 90 mgm XL. May need to start a second medication if blood pressures remain above 140. Some blurred vision and dull headache: unsure if this is from Procardia or magnesium or from elevated blood pressures or lack of sleep.  Continue to monitor blood pressures closely.   Farrel Conners, CNM    LOS: 3 days   Farrel Conners 12/08/2019, 2:44 PM

## 2019-12-08 NOTE — Anesthesia Postprocedure Evaluation (Signed)
Anesthesia Post Note  Patient: Ashley Herring  Procedure(s) Performed: AN AD HOC LABOR EPIDURAL  Patient location during evaluation: Mother Baby Anesthesia Type: Epidural Level of consciousness: awake and alert Pain management: pain level controlled Vital Signs Assessment: post-procedure vital signs reviewed and stable Respiratory status: spontaneous breathing, nonlabored ventilation and respiratory function stable Cardiovascular status: stable Postop Assessment: no headache, no backache and epidural receding Anesthetic complications: no     Last Vitals:  Vitals:   12/08/19 0616 12/08/19 0646  BP: (!) 143/74 (!) 146/86  Pulse: 88 90  Resp:    Temp:    SpO2:      Last Pain:  Vitals:   12/08/19 0601  TempSrc: Oral  PainSc:                  Elmarie Mainland

## 2019-12-09 MED ORDER — NIFEDIPINE ER OSMOTIC RELEASE 90 MG PO TB24: 90.0000 mg | ORAL_TABLET | Freq: Every day | ORAL | 2 refills | Status: DC

## 2019-12-09 MED ORDER — ENOXAPARIN SODIUM 80 MG/0.8ML ~~LOC~~ SOLN: 80.0000 mg | SUBCUTANEOUS | 1 refills | Status: DC

## 2019-12-09 NOTE — Progress Notes (Addendum)
Patient discharged home with infant. Discharge instructions and prescriptions given and reviewed with patient. BP check Thursday at 1030 am at [facility] office. Lovenox injection education given by teach back and handout/supplies given. Patient verbalized understanding. Escorted out by auxillary.

## 2019-12-09 NOTE — Discharge Instructions (Signed)

## 2019-12-09 NOTE — Lactation Note (Signed)
This note was copied from a baby's chart. Lactation Consultation Note  Patient Name: Ashley Herring [id number] Date: 12/09/2019 Reason for consult: Follow-up assessment  Mom continues to pump every 3hrs, reports output the last pumping session, a few mL of colostrum in bottom of bottle. Mom plans to continue with breast and formula feeding. Educated previously on formula preparation and paced-bottle feeding. Parents have no questions. Encouraged to call if needed.  Maternal Data Has patient been taught Hand Expression?: Yes  Feeding    LATCH Score                   Interventions Interventions: Breast feeding basics reviewed;DEBP  Lactation Tools Discussed/Used     Consult Status Consult Status: Complete    Danford Bad 12/09/2019, 9:32 AM

## 2019-12-11 ENCOUNTER — Other Ambulatory Visit: Payer: Self-pay

## 2019-12-11 ENCOUNTER — Other Ambulatory Visit: Payer: Medicaid Other

## 2019-12-11 ENCOUNTER — Ambulatory Visit (INDEPENDENT_AMBULATORY_CARE_PROVIDER_SITE_OTHER): Payer: Medicaid Other | Admitting: Obstetrics and Gynecology

## 2019-12-11 ENCOUNTER — Encounter: Payer: Self-pay | Admitting: Obstetrics and Gynecology

## 2019-12-11 ENCOUNTER — Encounter: Payer: Medicaid Other | Admitting: Obstetrics & Gynecology

## 2019-12-11 VITALS — BP 140/90 | Ht 66.0 in | Wt 323.0 lb

## 2019-12-11 DIAGNOSIS — Z0131 Encounter for examination of blood pressure with abnormal findings: Secondary | ICD-10-CM

## 2019-12-11 DIAGNOSIS — O1413 Severe pre-eclampsia, third trimester: Secondary | ICD-10-CM

## 2019-12-11 NOTE — Progress Notes (Signed)
Obstetrics & Gynecology Office Visit    Chief Complaint  Patient presents with  . Blood Pressure Check    History of Present Illness: [name] female who is postpartum day #4 from a term vaginal delivery complicated by severe preeclampsia (by blood pressure). She had an extensive workup in the hospital for an enlarged heart (CXR, hospitalist consult, ECHO).  She had an uncomplicated delivery and postpartum course and was discharged on Nifedipine XL 90 mg daily.  She was also discharged on lovenox 80 mg daily for VTE prophylaxis.  She has had an occasional headache and occasional blurry vision since discharge.  However, these come and go.  She does not have a blood pressure cuff at home.  However, she is going to buy one on her way home from this clinic appointment.  She is also taking her enoxaparin daily.  She has no other issues.  Notes normal lochia and no pain issues.    Past Medical History:  Diagnosis Date  . Back pain   . Eye inflammation   . Miscarriage   . Sciatica     Past Surgical History:  Procedure Laterality Date  . HEMORRHOID SURGERY      Gynecologic History: No LMP recorded.  Obstetric History: [name]  Family History  Problem Relation Age of Onset  . Hypertension Mother   . Diabetes Mother   . Hypertension Father   . Diabetes Father   . Asthma Daughter   . Breast cancer Paternal Aunt 30       Deceased from metastasis  . Cancer Maternal Grandmother   . Brain cancer Maternal Grandmother   . Brain cancer Paternal Grandmother   . Colon cancer Paternal Grandfather     Social History   Socioeconomic History  . Marital status: Married    Spouse name: Dennard Nip  . Number of children: 2  . Years of education: Not on file  . Highest education level: Not on file  Occupational History  . Not on file  Tobacco Use  . Smoking status: Former Smoker    Quit date: 01/31/2016    Years since quitting: 3.8  . Smokeless tobacco: Never Used  Substance and  Sexual Activity  . Alcohol use: No  . Drug use: Never  . Sexual activity: Yes    Partners: Male    Birth control/protection: None  Other Topics Concern  . Not on file  Social History Narrative   Have 2 children from previous relationships now married and they are trying to conceive    + POC urine preg 04/17/2019   Social Determinants of Health   Financial Resource Strain: Low Risk   . Difficulty of Paying Living Expenses: Not hard at all  Food Insecurity: No Food Insecurity  . Worried About Programme researcher, broadcasting/film/video in the Last Year: Never true  . Ran Out of Food in the Last Year: Never true  Transportation Needs: No Transportation Needs  . Lack of Transportation (Medical): No  . Lack of Transportation (Non-Medical): No  Physical Activity: Sufficiently Active  . Days of Exercise per Week: 7 days  . Minutes of Exercise per Session: 40 min  Stress: Stress Concern Present  . Feeling of Stress : To some extent  Social Connections: Not Isolated  . Frequency of Communication with Friends and Family: More than three times a week  . Frequency of Social Gatherings with Friends and Family: More than three times a week  . Attends Religious Services: 1 to 4 times  per year  . Active Member of Clubs or Organizations: Not on file  . Attends Archivist Meetings: 1 to 4 times per year  . Marital Status: Married  Human resources officer Violence: Not At Risk  . Fear of Current or Ex-Partner: No  . Emotionally Abused: No  . Physically Abused: No  . Sexually Abused: No    Allergies  Allergen Reactions  . Peanuts [Peanut Oil] Swelling    Prior to Admission medications   Medication Sig Start Date End Date Taking? Authorizing Provider  enoxaparin (LOVENOX) 80 MG/0.8ML injection Inject 0.8 mLs (80 mg total) into the skin daily. 12/10/19  Yes Gae Dry, MD  ferrous sulfate (FERROUSUL) 325 (65 FE) MG tablet Take 1 tablet (325 mg total) by mouth daily with breakfast. 10/05/19  Yes Malachy Mood, MD  NIFEdipine (PROCARDIA XL/NIFEDICAL-XL) 90 MG 24 hr tablet Take 1 tablet (90 mg total) by mouth daily. 12/09/19  Yes Gae Dry, MD  Prenatal Vit-Fe Fumarate-FA (PRENATAL VITAMINS PO) Take 1 tablet by mouth 1 day or 1 dose.   Yes [provider]    Review of Systems  Constitutional: Negative.   HENT: Negative.   Eyes: Negative.   Respiratory: Negative.   Cardiovascular: Negative.   Gastrointestinal: Negative.   Genitourinary: Negative.   Musculoskeletal: Negative.   Skin: Negative.   Neurological: Negative.   Psychiatric/Behavioral: Negative.      Physical Exam BP 140/90   Ht 5\' 6"  (1.676 m)   Wt (!) 323 lb (146.5 kg)   Breastfeeding Yes   BMI 52.13 kg/m  No LMP recorded. Physical Exam Constitutional:      General: She is not in acute distress.    Appearance: Normal appearance.  HENT:     Head: Normocephalic and atraumatic.  Eyes:     General: No scleral icterus.    Conjunctiva/sclera: Conjunctivae normal.  Cardiovascular:     Rate and Rhythm: Normal rate and regular rhythm.     Heart sounds: No murmur. No friction rub. No gallop.   Pulmonary:     Effort: Pulmonary effort is normal.     Breath sounds: Normal breath sounds. No wheezing, rhonchi or rales.  Neurological:     General: No focal deficit present.     Mental Status: She is alert and oriented to person, place, and time.     Cranial Nerves: No cranial nerve deficit.  Psychiatric:        Mood and Affect: Mood normal.        Behavior: Behavior normal.        Judgment: Judgment normal.     Female chaperone present for pelvic and breast  portions of the physical exam  Assessment: 39 y.o. [id number] female here for  1. Severe preeclampsia, third trimester   2. Postpartum care and examination      Plan: Problem List Items Addressed This Visit      Cardiovascular and Mediastinum   Severe preeclampsia, third trimester - Primary    Other Visit Diagnoses    Postpartum care and  examination         Continue current dosing of blood pressure medication and follow-up in 1 week.  Precautions given regarding headache, blurry vision, any other concerning symptoms.  She will check her blood pressure at home and let us know if she is getting values that are very high or very low.  Prentice Docker, MD 12/11/2019 10:20 AM

## 2019-12-18 ENCOUNTER — Other Ambulatory Visit: Payer: Self-pay

## 2019-12-18 ENCOUNTER — Encounter: Payer: Self-pay | Admitting: Obstetrics and Gynecology

## 2019-12-18 ENCOUNTER — Ambulatory Visit (INDEPENDENT_AMBULATORY_CARE_PROVIDER_SITE_OTHER): Payer: Medicaid Other | Admitting: Obstetrics and Gynecology

## 2019-12-18 DIAGNOSIS — O1415 Severe pre-eclampsia, complicating the puerperium: Secondary | ICD-10-CM

## 2019-12-18 DIAGNOSIS — O1413 Severe pre-eclampsia, third trimester: Secondary | ICD-10-CM

## 2019-12-18 NOTE — Progress Notes (Signed)
Obstetrics & Gynecology Office Visit    Chief Complaint  Patient presents with  . Postpartum Follow-up    Delivery 4/4    History of Present Illness: 39 y.o. [id number] female who is postpartum day #11 from a term vaginal delivery complicated by severe preeclampsia (by blood pressure). She had an extensive workup in the hospital for an enlarged heart (CXR, hospitalist consult, ECHO).  She had an uncomplicated delivery and postpartum course and was discharged on Nifedipine XL 90 mg daily.  She was also discharged on lovenox 80 mg daily for VTE prophylaxis.  She has had an occasional headache and occasional blurry vision since discharge.  However, these come and go.  She does not have a blood pressure cuff at home.  She has her BP cuff on the way.  She is also taking her enoxaparin daily.  She has no other issues.  Notes normal lochia and no pain issues.   Past Medical History:  Diagnosis Date  . Back pain   . Eye inflammation   . Miscarriage   . Sciatica     Past Surgical History:  Procedure Laterality Date  . HEMORRHOID SURGERY      Gynecologic History: No LMP recorded.  Obstetric History: [id number]  Family History  Problem Relation Age of Onset  . Hypertension Mother   . Diabetes Mother   . Hypertension Father   . Diabetes Father   . Asthma Daughter   . Breast cancer Paternal Aunt 20       Deceased from metastasis  . Cancer Maternal Grandmother   . Brain cancer Maternal Grandmother   . Brain cancer Paternal Grandmother   . Colon cancer Paternal Grandfather     Social History   Socioeconomic History  . Marital status: Married    Spouse name: Cornelia Copa  . Number of children: 2  . Years of education: Not on file  . Highest education level: Not on file  Occupational History  . Not on file  Tobacco Use  . Smoking status: Former Smoker    Quit date: 01/31/2016    Years since quitting: 3.8  . Smokeless tobacco: Never Used  Substance and Sexual Activity  . Alcohol use:  No  . Drug use: Never  . Sexual activity: Yes    Partners: Male    Birth control/protection: None  Other Topics Concern  . Not on file  Social History Narrative   Have 2 children from previous relationships now married and they are trying to conceive    + POC urine preg 04/17/2019   Social Determinants of Health   Financial Resource Strain: Low Risk   . Difficulty of Paying Living Expenses: Not hard at all  Food Insecurity: No Food Insecurity  . Worried About Charity fundraiser in the Last Year: Never true  . Ran Out of Food in the Last Year: Never true  Transportation Needs: No Transportation Needs  . Lack of Transportation (Medical): No  . Lack of Transportation (Non-Medical): No  Physical Activity: Sufficiently Active  . Days of Exercise per Week: 7 days  . Minutes of Exercise per Session: 40 min  Stress: Stress Concern Present  . Feeling of Stress : To some extent  Social Connections: Not Isolated  . Frequency of Communication with Friends and Family: More than three times a week  . Frequency of Social Gatherings with Friends and Family: More than three times a week  . Attends Religious Services: 1 to 4 times per year  .  Active Member of Clubs or Organizations: Not on file  . Attends Banker Meetings: 1 to 4 times per year  . Marital Status: Married  Catering manager Violence: Not At Risk  . Fear of Current or Ex-Partner: No  . Emotionally Abused: No  . Physically Abused: No  . Sexually Abused: No    Allergies  Allergen Reactions  . Peanuts [Peanut Oil] Swelling    Prior to Admission medications   Medication Sig Start Date End Date Taking? Authorizing Provider  enoxaparin (LOVENOX) 80 MG/0.8ML injection Inject 0.8 mLs (80 mg total) into the skin daily. 12/10/19  Yes Nadara Mustard, MD  ferrous sulfate (FERROUSUL) 325 (65 FE) MG tablet Take 1 tablet (325 mg total) by mouth daily with breakfast. 10/05/19  Yes Vena Austria, MD  NIFEdipine (PROCARDIA  XL/NIFEDICAL-XL) 90 MG 24 hr tablet Take 1 tablet (90 mg total) by mouth daily. 12/09/19  Yes Nadara Mustard, MD  Prenatal Vit-Fe Fumarate-FA (PRENATAL VITAMINS PO) Take 1 tablet by mouth 1 day or 1 dose.   Yes [provider]    Review of Systems  Constitutional: Negative.   HENT: Negative.   Eyes: Negative.   Respiratory: Negative.   Cardiovascular: Negative.   Gastrointestinal: Negative.   Genitourinary: Negative.   Musculoskeletal: Negative.   Skin: Negative.   Neurological: Negative.   Psychiatric/Behavioral: Negative.      Physical Exam BP (!) 146/88   Pulse 92   Wt (!) 312 lb (141.5 kg)   BMI 50.36 kg/m  No LMP recorded. Physical Exam Constitutional:      General: She is not in acute distress.    Appearance: Normal appearance.  HENT:     Head: Normocephalic and atraumatic.  Eyes:     General: No scleral icterus.    Conjunctiva/sclera: Conjunctivae normal.  Cardiovascular:     Rate and Rhythm: Normal rate and regular rhythm.     Heart sounds: No murmur. No friction rub. No gallop.   Pulmonary:     Effort: Pulmonary effort is normal.     Breath sounds: Normal breath sounds. No wheezing, rhonchi or rales.  Neurological:     General: No focal deficit present.     Mental Status: She is alert and oriented to person, place, and time.     Cranial Nerves: No cranial nerve deficit.  Psychiatric:        Mood and Affect: Mood normal.        Behavior: Behavior normal.        Judgment: Judgment normal.     Female chaperone present for pelvic and breast  portions of the physical exam  Assessment: 39 y.o. [id number] female here for  1. Postpartum care and examination   2. Severe preeclampsia, third trimester      Plan: Problem List Items Addressed This Visit      Cardiovascular and Mediastinum   Severe preeclampsia, third trimester    Other Visit Diagnoses    Postpartum care and examination    -  Primary     Continue current dosing of blood pressure  medication and follow-up in 2 week.  Precautions given regarding headache, blurry vision, any other concerning symptoms.  She will check her blood pressure at home and let us know if she is getting values that are very high or very low.  Thomasene Mohair, MD 12/18/2019 5:10 PM

## 2019-12-24 ENCOUNTER — Ambulatory Visit: Payer: Medicaid Other | Admitting: Advanced Practice Midwife

## 2020-01-01 ENCOUNTER — Encounter: Payer: Self-pay | Admitting: Obstetrics and Gynecology

## 2020-01-01 ENCOUNTER — Ambulatory Visit (INDEPENDENT_AMBULATORY_CARE_PROVIDER_SITE_OTHER): Payer: Medicaid Other | Admitting: Obstetrics and Gynecology

## 2020-01-01 ENCOUNTER — Other Ambulatory Visit: Payer: Self-pay

## 2020-01-01 DIAGNOSIS — O1415 Severe pre-eclampsia, complicating the puerperium: Secondary | ICD-10-CM

## 2020-01-01 DIAGNOSIS — O1413 Severe pre-eclampsia, third trimester: Secondary | ICD-10-CM

## 2020-01-01 NOTE — Progress Notes (Signed)
Obstetrics & Gynecology Office Visit    Chief Complaint  Patient presents with  . Follow-up    B/P check    History of Present Illness: 39 y.o. [id number] female who is postpartum day #25 (delivered 4/4) from a term vaginal delivery complicated by severe preeclampsia (by blood pressure). She had an extensive workup in the hospital for an enlarged heart (CXR, hospitalist consult, ECHO).  She had an uncomplicated delivery and postpartum course and was discharged on Nifedipine XL 90 mg daily.  She was also discharged on lovenox 80 mg daily for VTE prophylaxis.  She continues her enoxaparin. She has no other symptoms apart from a nearly daily 5-minute episode of heart palpitations, chest tightness, shortness of breath, feeling lightheaded.  This subsides quickly. She notes no particular inciting events. She does not note any baseline anxiety and does not have a history of anxiety. She denies any depression symptoms.  She denies chest pain (apart from tightness). She denies new edema.  She notes heartburn while recumbent.    Past Medical History:  Diagnosis Date  . Back pain   . Eye inflammation   . Miscarriage   . Sciatica     Past Surgical History:  Procedure Laterality Date  . HEMORRHOID SURGERY      Gynecologic History: No LMP recorded.  Obstetric History: [id number]  Family History  Problem Relation Age of Onset  . Hypertension Mother   . Diabetes Mother   . Hypertension Father   . Diabetes Father   . Asthma Daughter   . Breast cancer Paternal Aunt 30       Deceased from metastasis  . Cancer Maternal Grandmother   . Brain cancer Maternal Grandmother   . Brain cancer Paternal Grandmother   . Colon cancer Paternal Grandfather     Social History   Socioeconomic History  . Marital status: Married    Spouse name: Dennard Nip  . Number of children: 2  . Years of education: Not on file  . Highest education level: Not on file  Occupational History  . Not on file  Tobacco Use  .  Smoking status: Former Smoker    Quit date: 01/31/2016    Years since quitting: 3.9  . Smokeless tobacco: Never Used  Substance and Sexual Activity  . Alcohol use: No  . Drug use: Never  . Sexual activity: Yes    Partners: Male    Birth control/protection: None  Other Topics Concern  . Not on file  Social History Narrative  . Not on file   Social Determinants of Health   Financial Resource Strain: Low Risk   . Difficulty of Paying Living Expenses: Not hard at all  Food Insecurity: No Food Insecurity  . Worried About Programme researcher, broadcasting/film/video in the Last Year: Never true  . Ran Out of Food in the Last Year: Never true  Transportation Needs: No Transportation Needs  . Lack of Transportation (Medical): No  . Lack of Transportation (Non-Medical): No  Physical Activity: Sufficiently Active  . Days of Exercise per Week: 7 days  . Minutes of Exercise per Session: 40 min  Stress: Stress Concern Present  . Feeling of Stress : To some extent  Social Connections: Not Isolated  . Frequency of Communication with Friends and Family: More than three times a week  . Frequency of Social Gatherings with Friends and Family: More than three times a week  . Attends Religious Services: 1 to 4 times per year  . Active Member of  Clubs or Organizations: Not on file  . Attends Banker Meetings: 1 to 4 times per year  . Marital Status: Married  Catering manager Violence: Not At Risk  . Fear of Current or Ex-Partner: No  . Emotionally Abused: No  . Physically Abused: No  . Sexually Abused: No    Allergies  Allergen Reactions  . Peanuts [Peanut Oil] Swelling    Prior to Admission medications   Medication Sig Start Date End Date Taking? Authorizing Provider  enoxaparin (LOVENOX) 80 MG/0.8ML injection Inject 0.8 mLs (80 mg total) into the skin daily. 12/10/19  Yes Nadara Mustard, MD  ferrous sulfate (FERROUSUL) 325 (65 FE) MG tablet Take 1 tablet (325 mg total) by mouth daily with  breakfast. 10/05/19  Yes Vena Austria, MD  NIFEdipine (PROCARDIA XL/NIFEDICAL-XL) 90 MG 24 hr tablet Take 1 tablet (90 mg total) by mouth daily. 12/09/19  Yes Nadara Mustard, MD  Prenatal Vit-Fe Fumarate-FA (PRENATAL VITAMINS PO) Take 1 tablet by mouth 1 day or 1 dose.   Yes [provider]    Review of Systems  Constitutional: Negative.   HENT: Negative.   Eyes: Negative.   Respiratory: Negative.   Cardiovascular: Positive for palpitations. Negative for chest pain, orthopnea, claudication, leg swelling and PND.       See HPI  Gastrointestinal: Negative.   Genitourinary: Negative.   Musculoskeletal: Negative.   Skin: Negative.   Neurological: Negative.   Psychiatric/Behavioral: Negative for depression, hallucinations, memory loss, substance abuse and suicidal ideas. The patient is nervous/anxious (see HPI). The patient does not have insomnia.      Physical Exam BP 139/69   Wt (!) 318 lb (144.2 kg)   BMI 51.33 kg/m  No LMP recorded. Physical Exam Constitutional:      General: She is not in acute distress.    Appearance: Normal appearance.  HENT:     Head: Normocephalic and atraumatic.  Eyes:     General: No scleral icterus.    Conjunctiva/sclera: Conjunctivae normal.  Cardiovascular:     Rate and Rhythm: Normal rate and regular rhythm.     Heart sounds: No murmur. No friction rub. No gallop.   Pulmonary:     Effort: Pulmonary effort is normal.     Breath sounds: Normal breath sounds. No wheezing, rhonchi or rales.  Neurological:     General: No focal deficit present.     Mental Status: She is alert and oriented to person, place, and time.     Cranial Nerves: No cranial nerve deficit.  Psychiatric:        Mood and Affect: Mood normal.        Behavior: Behavior normal.        Judgment: Judgment normal.     Female chaperone present for pelvic and breast  portions of the physical exam  Assessment: 39 y.o. [id number] female here for  1. Postpartum care and  examination   2. Severe preeclampsia, third trimester      Plan: Problem List Items Addressed This Visit      Cardiovascular and Mediastinum   Severe preeclampsia, third trimester    Other Visit Diagnoses    Postpartum care and examination    -  Primary     Continue current dosing of blood pressure medication and follow-up in 3 week.  Precautions given regarding headache, blurry vision, any other concerning symptoms.  She will check her blood pressure at home and let us know if she is getting values that  are very high or very low. For her palpitations, continue to monitor for now. I don't want to completely say these are related to something like a panic attack. This could be a cardiovascular issue.  She will monitor for worsening symptoms. If present, she will let me know (increase in frequency and intensity). If an episode persists, I recommended that she go to the ER.  PE on the less likely due to her report of taking the medication and the intermittent nature of the episodes.   Prentice Docker, MD 01/01/2020 5:08 PM

## 2020-01-22 ENCOUNTER — Other Ambulatory Visit: Payer: Self-pay

## 2020-01-22 ENCOUNTER — Ambulatory Visit (INDEPENDENT_AMBULATORY_CARE_PROVIDER_SITE_OTHER): Payer: Medicaid Other | Admitting: Obstetrics and Gynecology

## 2020-01-22 ENCOUNTER — Encounter: Payer: Self-pay | Admitting: Obstetrics and Gynecology

## 2020-01-22 DIAGNOSIS — I517 Cardiomegaly: Secondary | ICD-10-CM

## 2020-01-22 DIAGNOSIS — Z30430 Encounter for insertion of intrauterine contraceptive device: Secondary | ICD-10-CM

## 2020-01-22 DIAGNOSIS — O1415 Severe pre-eclampsia, complicating the puerperium: Secondary | ICD-10-CM

## 2020-01-22 NOTE — Progress Notes (Signed)
Postpartum Visit  Chief Complaint:  Chief Complaint  Patient presents with  . Postpartum Care    Vaginal delivery 12/07/19  . Contraception    MIrena IUD    History of Present Illness: Patient is a 39 y.o. [id number] presents for postpartum visit. Date of delivery: 12/07/2019 Type of delivery: Vaginal delivery - Vacuum or forceps assisted  no Episiotomy No.  Laceration: no  Pregnancy or labor problems:  Yes preeclampsia with severe features Any problems since the delivery:  no  Newborn Details:  SINGLETON :  1. BabyGender female. Birth weight: 3150g 6lbs 15oz Maternal Details:  Breast or formula feeding: plans to breastfeed Intercourse: No  Contraception after delivery: No  Any bowel or bladder issues: No  Post partum depression/anxiety noted:  no Edinburgh Post-Partum Depression Score:1 Date of last PAP:  04/04/2019 NIL and HR HPV negative   Review of Systems: Review of Systems  Gastrointestinal: Negative.   Genitourinary: Negative.   Psychiatric/Behavioral: Negative.     The following portions of the patient's history were reviewed and updated as appropriate: allergies, current medications, past family history, past medical history, past social history, past surgical history and problem list.  Past Medical History:  Past Medical History:  Diagnosis Date  . Back pain   . Eye inflammation   . Miscarriage   . Sciatica     Past Surgical History:  Past Surgical History:  Procedure Laterality Date  . HEMORRHOID SURGERY      Family History:  Family History  Problem Relation Age of Onset  . Hypertension Mother   . Diabetes Mother   . Hypertension Father   . Diabetes Father   . Asthma Daughter   . Breast cancer Paternal Aunt 79       Deceased from metastasis  . Cancer Maternal Grandmother   . Brain cancer Maternal Grandmother   . Brain cancer Paternal Grandmother   . Colon cancer Paternal Grandfather     Social History:  Social History   Socioeconomic  History  . Marital status: Married    Spouse name: Cornelia Copa  . Number of children: 2  . Years of education: Not on file  . Highest education level: Not on file  Occupational History  . Not on file  Tobacco Use  . Smoking status: Former Smoker    Quit date: 01/31/2016    Years since quitting: 3.9  . Smokeless tobacco: Never Used  Substance and Sexual Activity  . Alcohol use: No  . Drug use: Never  . Sexual activity: Yes    Partners: Male    Birth control/protection: I.U.D.  Other Topics Concern  . Not on file  Social History Narrative  . Not on file   Social Determinants of Health   Financial Resource Strain: Low Risk   . Difficulty of Paying Living Expenses: Not hard at all  Food Insecurity: No Food Insecurity  . Worried About Charity fundraiser in the Last Year: Never true  . Ran Out of Food in the Last Year: Never true  Transportation Needs: No Transportation Needs  . Lack of Transportation (Medical): No  . Lack of Transportation (Non-Medical): No  Physical Activity: Sufficiently Active  . Days of Exercise per Week: 7 days  . Minutes of Exercise per Session: 40 min  Stress: Stress Concern Present  . Feeling of Stress : To some extent  Social Connections: Not Isolated  . Frequency of Communication with Friends and Family: More than three times a week  .  Frequency of Social Gatherings with Friends and Family: More than three times a week  . Attends Religious Services: 1 to 4 times per year  . Active Member of Clubs or Organizations: Not on file  . Attends Banker Meetings: 1 to 4 times per year  . Marital Status: Married  Catering manager Violence: Not At Risk  . Fear of Current or Ex-Partner: No  . Emotionally Abused: No  . Physically Abused: No  . Sexually Abused: No    Allergies:  Allergies  Allergen Reactions  . Peanuts [Peanut Oil] Swelling    Medications: Prior to Admission medications   Medication Sig Start Date End Date Taking?  Authorizing Provider  enoxaparin (LOVENOX) 80 MG/0.8ML injection Inject 0.8 mLs (80 mg total) into the skin daily. 12/10/19   Nadara Mustard, MD  ferrous sulfate (FERROUSUL) 325 (65 FE) MG tablet Take 1 tablet (325 mg total) by mouth daily with breakfast. 10/05/19   Vena Austria, MD  NIFEdipine (PROCARDIA XL/NIFEDICAL-XL) 90 MG 24 hr tablet Take 1 tablet (90 mg total) by mouth daily. 12/09/19   Nadara Mustard, MD  Prenatal Vit-Fe Fumarate-FA (PRENATAL VITAMINS PO) Take 1 tablet by mouth 1 day or 1 dose.    [provider]    Physical Exam Blood pressure 134/69, weight (!) 321 lb (145.6 kg), currently breastfeeding.     General: NAD HEENT: normocephalic, anicteric Pulmonary: No increased work of breathing Abdomen: NABS, soft, non-tender, non-distended.  Umbilicus without lesions.  No hepatomegaly, splenomegaly or masses palpable. No evidence of hernia. Genitourinary:  External: Normal external female genitalia.  Normal urethral meatus, normal  Bartholin's and Skene's glands.    Vagina: Normal vaginal mucosa, no evidence of prolapse.    Cervix: Grossly normal in appearance, no bleeding  Uterus: Non-enlarged, mobile, normal contour.  No CMT  Adnexa: ovaries non-enlarged, no adnexal masses  Rectal: deferred Extremities: no edema, erythema, or tenderness Neurologic: Grossly intact Psychiatric: mood appropriate, affect full  Edinburgh Postnatal Depression Scale - 01/22/20 1622      Edinburgh Postnatal Depression Scale:  In the Past 7 Days   I have been able to laugh and see the funny side of things.  0    I have looked forward with enjoyment to things.  0    I have blamed myself unnecessarily when things went wrong.  1    I have been anxious or worried for no good reason.  0    I have felt scared or panicky for no good reason.  1    Things have been getting on top of me.  2    I have been so unhappy that I have had difficulty sleeping.  0    I have felt sad or miserable.   0    I have been so unhappy that I have been crying.  0    The thought of harming myself has occurred to me.  0    Edinburgh Postnatal Depression Scale Total  4       GYNECOLOGY OFFICE PROCEDURE NOTE  Ashley Herring is a 39 y.o. [id number] here for a Mirena IUD insertion. No GYN concerns. .  The indication for her IUD is contraception/cycle control.  IUD Insertion Procedure Note Patient identified, informed consent performed, consent signed.   Discussed risks of irregular bleeding, cramping, infection, malpositioning, expulsion or uterine perforation of the IUD (1:1000 placements)  which may require further procedure such as laparoscopy.  IUD while effective at preventing pregnancy do  not prevent transmission of sexually transmitted diseases and use of barrier methods for this purpose was discussed. Time out was performed.  Urine pregnancy test negative.  Speculum placed in the vagina.  Cervix visualized.  Cleaned with Betadine x 2.  Grasped anteriorly with a single tooth tenaculum.  Uterus sounded to 9 cm. IUD placed per manufacturer's recommendations.  Strings trimmed to 3 cm. Tenaculum was removed, good hemostasis noted.  Patient tolerated procedure well.   Patient was given post-procedure instructions.  She was advised to have backup contraception for one week.  Patient was also asked to check IUD strings periodically and follow up in 6 weeks for IUD check.    Assessment: 40 y.o. [id number] presenting for 6 week postpartum visit  Plan: Problem List Items Addressed This Visit    None    Visit Diagnoses    6 weeks postpartum follow-up    -  Primary   Encounter for IUD insertion       Cardiomegaly       Relevant Orders   Ambulatory referral to Cardiology   Hypertension in pregnancy, preeclampsia, severe, delivered/postpartum       Relevant Orders   Ambulatory referral to Cardiology       1) Contraception - Education given regarding options for contraception, as well as  compatibility with breast feeding if applicable.  Patient plans on IUD for contraception.  2)  Pap - ASCCP guidelines and rational discussed.  ASCCP guidelines and rational discussed.  Patient opts for every 3 years screening interval  3) Patient underwent screening for postpartum depression with no signs of depression   4) BP remains well controlled on procardia XL 90mg , will have patient establish with cardiology for recs on long term follow up management of cardiomegaly with normal function/LVEF on echocardiogram  4) Return in about 6 weeks (around 03/04/2020) for IUD string check.   05/05/2020, MD, Vena Austria [facility], [facility] 01/22/2020, 4:47 PM

## 2020-01-26 ENCOUNTER — Ambulatory Visit: Payer: Medicaid Other | Admitting: Cardiology

## 2020-01-29 ENCOUNTER — Other Ambulatory Visit: Payer: Self-pay

## 2020-01-29 ENCOUNTER — Encounter: Payer: Self-pay | Admitting: Cardiovascular Disease

## 2020-01-29 ENCOUNTER — Ambulatory Visit (INDEPENDENT_AMBULATORY_CARE_PROVIDER_SITE_OTHER): Payer: Medicaid Other

## 2020-01-29 ENCOUNTER — Ambulatory Visit (INDEPENDENT_AMBULATORY_CARE_PROVIDER_SITE_OTHER): Payer: Medicaid Other | Admitting: Cardiovascular Disease

## 2020-01-29 VITALS — BP 130/81 | HR 81 | Ht 66.0 in | Wt 328.6 lb

## 2020-01-29 DIAGNOSIS — I10 Essential (primary) hypertension: Secondary | ICD-10-CM

## 2020-01-29 DIAGNOSIS — Z6841 Body Mass Index (BMI) 40.0 and over, adult: Secondary | ICD-10-CM | POA: Diagnosis not present

## 2020-01-29 DIAGNOSIS — R002 Palpitations: Secondary | ICD-10-CM

## 2020-01-29 NOTE — Patient Instructions (Signed)
Medication Instructions:  Your physician recommends that you continue on your current medications as directed. Please refer to the Current Medication list given to you today.  *If you need a refill on your cardiac medications before your next appointment, please call your pharmacy*   Lab Work: None ordered  If you have labs (blood work) drawn today and your tests are completely normal, you will receive your results only by: Marland Kitchen MyChart Message (if you have MyChart) OR . A paper copy in the mail If you have any lab test that is abnormal or we need to change your treatment, we will call you to review the results.   Testing/Procedures: 1- A zio monitor was placed today. It will remain on for 14 days. You will then return monitor and event diary in provided box. It takes 1-2 weeks for report to be downloaded and returned to [place]. We will call you with the results. If monitor falls of or has orange flashing light, please call Zio for further instructions.    Follow-Up: At [facility], you and your health needs are our priority.  As part of our continuing mission to provide you with exceptional heart care, we have created designated Provider Care Teams.  These Care Teams include your primary Cardiologist (physician) and Advanced Practice Providers (APPs -  Physician Assistants and Nurse Practitioners) who all work together to provide you with the care you need, when you need it.  We recommend signing up for the patient portal called "MyChart".  Sign up information is provided on this After Visit Summary.  MyChart is used to connect with patients for Virtual Visits (Telemedicine).  Patients are able to view lab/test results, encounter notes, upcoming appointments, etc.  Non-urgent messages can be sent to your provider as well.   To learn more about what you can do with MyChart, go to [email or web address].    Your next appointment:   PRN  The format for your next appointment:   In  Person  Provider:    You may see Dr. Kirke Corin or one of the following Advanced Practice Providers on your designated Care Team:    Nicolasa Ducking, NP  Eula Listen, PA-C  Marisue Ivan, PA-C

## 2020-01-29 NOTE — Progress Notes (Signed)
Cardiology Office Note   Date:  01/29/2020   ID:  Verner, Kopischke 08-Oct-1980, MRN [id number]  PCP:  Doren Custard, FNP  Cardiologist:   Lorine Bears, MD   Chief Complaint  Patient presents with  . office visit    Pt states heart racing/ swelling/ dizziness. Meds verbally reviewed w/ pt.       History of Present Illness: Ashley Herring is a 39 y.o. female who was referred by Dr. Bonney Aid for evaluation of cardiomegaly and palpitations. She has no prior cardiac history but does report remote syncope.  She has history of morbid obesity.  During her most recent pregnancy, she had issues with elevated blood pressure and was hospitalized in April for scheduled NST.  She developed preeclampsia and her blood pressure was close to 190 mmHg.  She had mild headache but no other symptoms.  There was mild swelling in lower extremities.  She required multiple antihypertensive medications.  She had a chest x-ray that showed cardiomegaly.  BNP was mildly elevated at 195.  She had an echocardiogram done which was personally reviewed by me.  It showed an EF of 55 to 60% with mildly dilated left ventricle and mild LVH.  There was mild mitral regurgitation with no other significant abnormalities.  The patient's blood pressure improved after delivery and she was discharged home on nifedipine.  She has been doing reasonably well but has been having increased palpitations and intermittent tachycardia with associated dizziness and ringing in her ears.  No recent syncope.  She is not a smoker and has no family history of heart disease.  She is a Manufacturing systems engineer. She did not have these issues with 2 previous pregnancies.    Past Medical History:  Diagnosis Date  . Back pain   . Eye inflammation   . Miscarriage   . Sciatica     Past Surgical History:  Procedure Laterality Date  . HEMORRHOID SURGERY       Current Outpatient Medications  Medication Sig Dispense Refill  . NIFEdipine  (PROCARDIA XL/NIFEDICAL-XL) 90 MG 24 hr tablet Take 1 tablet (90 mg total) by mouth daily. 30 tablet 2  . Prenatal Vit-Fe Fumarate-FA (PRENATAL VITAMINS PO) Take 1 tablet by mouth 1 day or 1 dose.     No current facility-administered medications for this visit.    Allergies:   Peanuts [peanut oil]    Social History:  The patient  reports that she quit smoking about 3 years ago. She has never used smokeless tobacco. She reports that she does not drink alcohol or use drugs.   Family History:  The patient's family history includes Asthma in her daughter; Brain cancer in her maternal grandmother and paternal grandmother; Breast cancer (age of onset: 63) in her paternal aunt; Cancer in her maternal grandmother; Colon cancer in her paternal grandfather; Diabetes in her father and mother; Hypertension in her father and mother.    ROS:  Please see the history of present illness.   Otherwise, review of systems are positive for none.   All other systems are reviewed and negative.    PHYSICAL EXAM: VS:  BP 130/81 (BP Location: Right Arm, Patient Position: Sitting, Cuff Size: Normal)   Pulse 81   Ht 5\' 6"  (1.676 m)   Wt (!) 328 lb 9 oz (149 kg)   SpO2 99%   BMI 53.03 kg/m  , BMI Body mass index is 53.03 kg/m. GEN: Well nourished, well developed, in no acute distress  HEENT:  normal  Neck: no JVD, carotid bruits, or masses Cardiac: RRR; no murmurs, rubs, or gallops,no edema  Respiratory:  clear to auscultation bilaterally, normal work of breathing GI: soft, nontender, nondistended, + BS MS: no deformity or atrophy  Skin: warm and dry, no rash Neuro:  Strength and sensation are intact Psych: euthymic mood, full affect   EKG:  EKG is ordered today. The ekg ordered today demonstrates normal sinus rhythm with no significant ST or T wave changes.  No evidence of prior infarct.   Recent Labs: 01/31/2019: TSH 1.04 12/05/2019: ALT 11; B Natriuretic Peptide 194.0; BUN 8; Potassium 4.0; Sodium  138 12/08/2019: Creatinine, Ser 0.60; Hemoglobin 8.6; Platelets 269    Lipid Panel    Component Value Date/Time   CHOL 217 (H) 01/31/2019 1059   TRIG 98 01/31/2019 1059   HDL 40 (L) 01/31/2019 1059   CHOLHDL 5.4 (H) 01/31/2019 1059   LDLCALC 156 (H) 01/31/2019 1059      Wt Readings from Last 3 Encounters:  01/29/20 (!) 328 lb 9 oz (149 kg)  01/22/20 (!) 321 lb (145.6 kg)  01/01/20 (!) 318 lb (144.2 kg)       PAD Screen 01/29/2020  Previous PAD dx? No  Previous surgical procedure? No  Pain with walking? No  Feet/toe relief with dangling? Yes  Painful, non-healing ulcers? No  Extremities discolored? No      ASSESSMENT AND PLAN:  1.  Palpitations are intermittent tachycardia: Could be due to sinus tachycardia related to physical deconditioning.  Paroxysmal supraventricular tachycardia is also a possibility.  I requested a 2-week outpatient monitor.  2.  Mild cardiomegaly.  This was noted on her recent chest x-ray which was in the AP projection.  Echocardiogram showed only mildly dilated left ventricle which is likely in the setting of her morbid obesity.  Ejection fraction was normal.  I do not think this requires further work-up.  3.  Hypertension: Blood pressure is now reasonably controlled on nifedipine.  4.  Morbid obesity: BMI is 53.  I discussed with her the importance of regular exercise and weight loss.    Disposition:   FU with  me as needed if monitor is abnormal.  Signed,  Kathlyn Sacramento, MD  01/29/2020 4:01 PM    [facility]

## 2020-03-01 ENCOUNTER — Other Ambulatory Visit: Payer: Self-pay | Admitting: Obstetrics & Gynecology

## 2020-03-03 ENCOUNTER — Ambulatory Visit (INDEPENDENT_AMBULATORY_CARE_PROVIDER_SITE_OTHER): Payer: Medicaid Other | Admitting: Obstetrics and Gynecology

## 2020-03-03 ENCOUNTER — Other Ambulatory Visit: Payer: Self-pay

## 2020-03-03 ENCOUNTER — Encounter: Payer: Self-pay | Admitting: Obstetrics and Gynecology

## 2020-03-03 VITALS — BP 156/84 | Wt 334.0 lb

## 2020-03-03 DIAGNOSIS — Z30431 Encounter for routine checking of intrauterine contraceptive device: Secondary | ICD-10-CM

## 2020-03-03 NOTE — Progress Notes (Signed)
Obstetrics & Gynecology Office Visit   Chief Complaint:  Chief Complaint  Patient presents with  . Follow-up    IUD check, spotting    History of Present Illness: 39 y.o. patient presenting for follow up of Mirena IUD placement 6+ weeks ago.  The indication for her IUD was contraception.  She denies any complications since her IUD placement.  Still having some occasional spotting.  is able to feel strings.    Review of Systems: Review of Systems  Constitutional: Negative.   Gastrointestinal: Negative.   Genitourinary: Negative.     Past Medical History:  Past Medical History:  Diagnosis Date  . Back pain   . Eye inflammation   . Miscarriage   . Sciatica     Past Surgical History:  Past Surgical History:  Procedure Laterality Date  . HEMORRHOID SURGERY      Gynecologic History: No LMP recorded. (Menstrual status: IUD).  Obstetric History: [id number]  Family History:  Family History  Problem Relation Age of Onset  . Hypertension Mother   . Diabetes Mother   . Hypertension Father   . Diabetes Father   . Asthma Daughter   . Breast cancer Paternal Aunt 30       Deceased from metastasis  . Cancer Maternal Grandmother   . Brain cancer Maternal Grandmother   . Brain cancer Paternal Grandmother   . Colon cancer Paternal Grandfather     Social History:  Social History   Socioeconomic History  . Marital status: Married    Spouse name: Dennard Nip  . Number of children: 2  . Years of education: Not on file  . Highest education level: Not on file  Occupational History  . Not on file  Tobacco Use  . Smoking status: Former Smoker    Quit date: 01/31/2016    Years since quitting: 4.1  . Smokeless tobacco: Never Used  Vaping Use  . Vaping Use: Never used  Substance and Sexual Activity  . Alcohol use: No  . Drug use: Never  . Sexual activity: Yes    Partners: Male    Birth control/protection: I.U.D.  Other Topics Concern  . Not on file  Social History  Narrative  . Not on file   Social Determinants of Health   Financial Resource Strain:   . Difficulty of Paying Living Expenses:   Food Insecurity:   . Worried About Programme researcher, broadcasting/film/video in the Last Year:   . Barista in the Last Year:   Transportation Needs:   . Freight forwarder (Medical):   Marland Kitchen Lack of Transportation (Non-Medical):   Physical Activity:   . Days of Exercise per Week:   . Minutes of Exercise per Session:   Stress:   . Feeling of Stress :   Social Connections:   . Frequency of Communication with Friends and Family:   . Frequency of Social Gatherings with Friends and Family:   . Attends Religious Services:   . Active Member of Clubs or Organizations:   . Attends Banker Meetings:   Marland Kitchen Marital Status:   Intimate Partner Violence:   . Fear of Current or Ex-Partner:   . Emotionally Abused:   Marland Kitchen Physically Abused:   . Sexually Abused:     Allergies:  Allergies  Allergen Reactions  . Peanuts [Peanut Oil] Swelling    Medications: Prior to Admission medications   Medication Sig Start Date End Date Taking? Authorizing Provider  NIFEdipine (PROCARDIA XL/NIFEDICAL-XL)  90 MG 24 hr tablet TAKE 1 TABLET (90 MG TOTAL) BY MOUTH DAILY. 03/01/20   Nadara Mustard, MD  Prenatal Vit-Fe Fumarate-FA (PRENATAL VITAMINS PO) Take 1 tablet by mouth 1 day or 1 dose.    [provider]    Physical Exam Blood pressure (!) 156/84, weight (!) 334 lb (151.5 kg), currently breastfeeding. No LMP recorded. (Menstrual status: IUD).  General: NAD HEENT: normocephalic, anicteric Pulmonary: No increased work of breathing  Genitourinary:  External: Normal external female genitalia.  Normal urethral meatus, normal  Bartholin's and Skene's glands.    Vagina: Normal vaginal mucosa, no evidence of prolapse.    Cervix: Grossly normal in appearance, no bleeding, IUD strings visualized 2cm  Uterus: Non-enlarged, mobile, normal contour.  No CMT  Adnexa: ovaries  non-enlarged, no adnexal masses  Rectal: deferred  Lymphatic: no evidence of inguinal lymphadenopathy Extremities: no edema, erythema, or tenderness Neurologic: Grossly intact Psychiatric: mood appropriate, affect full  Female chaperone present for pelvic and breast  portions of the physical exam  Assessment: 39 y.o. [id number] No problem-specific Assessment & Plan notes found for this encounter.   Plan: Problem List Items Addressed This Visit    None    Visit Diagnoses    IUD check up    -  Primary       1.  The patient was given instructions to check her IUD strings monthly and call with any problems or concerns.  She should call for fevers, chills, abnormal vaginal discharge, pelvic pain, or other complaints.  2.   IUDs while effective at preventing pregnancy do not prevent transmission of sexually transmitted diseases and use of barrier methods for this purpose was discussed.  Low overall incidence of failure with 99.7% efficacy rate in typical use.  The patient has not contraindication to IUD placement.  3.  She will return for a annual exam in 1 year.  All questions answered.  4) A total of 15 minutes were spent in face-to-face contact with the patient during this encounter with over half of that time devoted to counseling and coordination of care.  5) Return in about 1 year (around 03/03/2021) for annual.   Vena Austria, MD, [facility], [facility] 03/03/2020, 4:35 PM

## 2020-04-13 ENCOUNTER — Ambulatory Visit: Payer: Medicaid Other | Admitting: Family Medicine

## 2020-05-14 ENCOUNTER — Encounter: Payer: Self-pay | Admitting: Family Medicine

## 2020-05-14 ENCOUNTER — Other Ambulatory Visit: Payer: Self-pay

## 2020-05-14 ENCOUNTER — Ambulatory Visit: Payer: Medicaid Other | Admitting: Family Medicine

## 2020-05-14 VITALS — BP 134/70 | HR 90 | Temp 98.7°F | Resp 18 | Ht 66.0 in | Wt 335.5 lb

## 2020-05-14 DIAGNOSIS — L299 Pruritus, unspecified: Secondary | ICD-10-CM

## 2020-05-14 DIAGNOSIS — R413 Other amnesia: Secondary | ICD-10-CM | POA: Diagnosis not present

## 2020-05-14 MED ORDER — HYDROXYZINE HCL 10 MG PO TABS: 10.0000 mg | ORAL_TABLET | Freq: Three times a day (TID) | ORAL | 1 refills | Status: DC | PRN

## 2020-05-14 NOTE — Progress Notes (Signed)
Patient ID: Ashley Herring, female    DOB: 1981-08-13, 39 y.o.   MRN: [id number]  PCP: Doren Custard, FNP  Chief Complaint  Patient presents with  . Recurrent Skin Infections    all over for 6 months  . Memory Loss    Subjective:   Ashley Herring is a 39 y.o. 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    Patient Active Problem List   Diagnosis Date Noted  . Postpartum care following vaginal delivery 12/07/2019  . Severe preeclampsia, third trimester 12/05/2019  . Anemia affecting pregnancy 10/05/2019  . Nipple problem 07/10/2019  . Obesity affecting pregnancy, antepartum 04/24/2019  . BMI 50.0-59.9, adult (HCC) 04/24/2019  . Hyperlipidemia 04/04/2019  . Elevated erythrocyte sedimentation rate 03/25/2019  . Scleritis of left eye 03/25/2019  . 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 Breast lump 01/31/2019  . DDD (degenerative disc disease), lumbar 01/31/2019  . Chronic left-sided low back pain with left-sided sciatica 01/31/2019  . Extreme obesity 01/31/2019      Current Outpatient Medications:  .  NIFEdipine (PROCARDIA XL/NIFEDICAL-XL) 90 MG 24 hr tablet, TAKE 1 TABLET (90 MG TOTAL) BY MOUTH DAILY., Disp: 90 tablet, Rfl: 0 .  hydrOXYzine (ATARAX/VISTARIL) 10 MG tablet, Take 1-2 tablets (10-20 mg total) by mouth every 8 (eight) hours as needed for itching., Disp: 60 tablet, Rfl: 1 .  ibuprofen  (ADVIL) 400 MG tablet, Take 400 mg by mouth 3 (three) times daily as needed., Disp: , Rfl:  .  Prenatal Vit-Fe Fumarate-FA (PRENATAL VITAMINS PO), Take 1 tablet by mouth 1 day or 1 dose. (Patient not taking: Reported on 05/14/2020), Disp: , Rfl:    Allergies  Allergen Reactions  . Peanuts [Peanut Oil] Swelling     Social History   Tobacco Use  . Smoking status: Former Smoker    Quit date: 01/31/2016    Years since quitting: 4.3  . Smokeless tobacco: Never Used  Vaping Use  . Vaping Use: Never used  Substance Use Topics  . Alcohol use: No  . Drug use: Never      Chart Review Today: I personally reviewed active problem list, medication list, allergies, family history, social history, health maintenance, notes from last encounter, lab results, imaging with the patient/caregiver today.   Review of Systems 10 Systems reviewed and are negative for acute change except as noted in the HPI.     Objective:   Vitals:   05/14/20 1515  BP: 134/70  Pulse: 90  Resp: 18  Temp: 98.7 F (37.1 C)  TempSrc: Oral  SpO2: 96%  Weight: (!) 335 lb 8 oz (152.2 kg)  Height: 5\' 6"  (1.676 m)    Body mass index is 54.15 kg/m.  Physical Exam Vitals and nursing note reviewed.  Constitutional:      General: She is not in acute  distress.    Appearance: Normal appearance. She is obese. She is not ill-appearing, toxic-appearing or diaphoretic.  HENT:     Head: Normocephalic and atraumatic.     Right Ear: External ear normal.     Left Ear: External ear normal.     Nose: Nose normal. No congestion or rhinorrhea.     Mouth/Throat:     Mouth: Mucous membranes are moist.  Eyes:     General: No scleral icterus.       Right eye: No discharge.        Left eye: No discharge.     Conjunctiva/sclera: Conjunctivae normal.     Pupils: Pupils are equal, round, and reactive to light.  Cardiovascular:     Rate and Rhythm: Normal rate and regular rhythm.     Pulses: Normal pulses.     Heart sounds:  Normal heart sounds.  Pulmonary:     Effort: Pulmonary effort is normal.     Breath sounds: Normal breath sounds.  Abdominal:     General: Bowel sounds are normal.     Palpations: Abdomen is soft.  Musculoskeletal:        General: No swelling.     Right lower leg: No edema.     Left lower leg: No edema.  Skin:    General: Skin is warm and dry.     Capillary Refill: Capillary refill takes less than 2 seconds.     Coloration: Skin is not jaundiced or pale.     Findings: No erythema or rash.  Neurological:     Mental Status: She is alert.  Psychiatric:        Mood and Affect: Mood normal.        Behavior: Behavior normal.      Results for orders placed or performed in visit on 05/14/20  COMPLETE METABOLIC PANEL WITH GFR  Result Value Ref Range   Glucose, Bld 79 65 - 99 mg/dL   BUN 12 7 - 25 mg/dL   Creat 8.37 2.90 - 2.11 mg/dL   GFR, Est Non African American 113 > OR = 60 mL/min/1.77m2   GFR, Est African American 131 > OR = 60 mL/min/1.75m2   BUN/Creatinine Ratio NOT APPLICABLE 6 - 22 (calc)   Sodium 140 135 - 146 mmol/L   Potassium 4.2 3.5 - 5.3 mmol/L   Chloride 105 98 - 110 mmol/L   CO2 25 20 - 32 mmol/L   Calcium 9.3 8.6 - 10.2 mg/dL   Total Protein 7.5 6.1 - 8.1 g/dL   Albumin 4.4 3.6 - 5.1 g/dL   Globulin 3.1 1.9 - 3.7 g/dL (calc)   AG Ratio 1.4 1.0 - 2.5 (calc)   Total Bilirubin 0.3 0.2 - 1.2 mg/dL   Alkaline phosphatase (APISO) 51 31 - 125 U/L   AST 19 10 - 30 U/L   ALT 17 6 - 29 U/L  CBC with Differential/Platelet  Result Value Ref Range   WBC 8.9 3.8 - 10.8 Thousand/uL   RBC 4.32 3.80 - 5.10 Million/uL   Hemoglobin 11.5 (L) 11.7 - 15.5 g/dL   HCT 15.5 35 - 45 %   MCV 83.8 80.0 - 100.0 fL   MCH 26.6 (L) 27.0 - 33.0 pg   MCHC 31.8 (L) 32.0 - 36.0 g/dL   RDW 20.8 02.2 - 33.6 %   Platelets 388 140 - 400 Thousand/uL   MPV 11.0 7.5 - 12.5 fL   Neutro Abs 5,162 1,500 - 7,800 cells/uL   Lymphs  Abs 2,804 850 - 3,900 cells/uL   Absolute Monocytes 641 200 - 950  cells/uL   Eosinophils Absolute 240 15 - 500 cells/uL   Basophils Absolute 53 0 - 200 cells/uL   Neutrophils Relative % 58 %   Total Lymphocyte 31.5 %   Monocytes Relative 7.2 %   Eosinophils Relative 2.7 %   Basophils Relative 0.6 %  TSH  Result Value Ref Range   TSH 1.10 mIU/L  T4, free  Result Value Ref Range   Free T4 1.1 0.8 - 1.8 ng/dL       Assessment & Plan:   1. Pruritus Intermittent itching in various places all over her body, no suspected or identified cause.  She notes that she has not changed soaps detergents body wash lotions etc., no new medications, no visible rash or hives.   Will screen for electrolyte abnormalities, check her CBC with differential to see if it appears that she has elevated eosinophils/severe allergies, we discussed possible referral to dermatology or an allergist Of hydroxyzine lower dose 10 mg, take 1 to 2 tablets up to 3 times a day as needed for itching Encourage patient to start a second-generation antihistamine once daily, continue to use mild soaps and detergents, to use Vaseline to help moisturize her skin after bathing and to limit other exposures to chemicals or soaps -may also possibly be atopic dermatitis/eczema? - COMPLETE METABOLIC PANEL WITH GFR - CBC with Differential/Platelet - TSH - T4, free  2. Memory problem We will rule out hypothyroid but do suspect that his likely due to her busy schedule, lack of sleep and may also be somewhat related to postpartum period/new baby.   Sleep apnea also in the differential - TSH - T4, free      Danelle Berry, PA-C 06/03/20 1:51 AM

## 2020-05-15 LAB — COMPLETE METABOLIC PANEL WITH GFR
AG Ratio: 1.4 (calc) (ref 1.0–2.5)
ALT: 17 U/L (ref 6–29)
AST: 19 U/L (ref 10–30)
Albumin: 4.4 g/dL (ref 3.6–5.1)
Alkaline phosphatase (APISO): 51 U/L (ref 31–125)
BUN: 12 mg/dL (ref 7–25)
CO2: 25 mmol/L (ref 20–32)
Calcium: 9.3 mg/dL (ref 8.6–10.2)
Chloride: 105 mmol/L (ref 98–110)
Creat: 0.65 mg/dL (ref 0.50–1.10)
GFR, Est African American: 131 mL/min/{1.73_m2} (ref >=60)
GFR, Est Non African American: 113 mL/min/{1.73_m2} (ref >=60)
Globulin: 3.1 g/dL (calc) (ref 1.9–3.7)
Glucose, Bld: 79 mg/dL (ref 65–99)
Potassium: 4.2 mmol/L (ref 3.5–5.3)
Sodium: 140 mmol/L (ref 135–146)
Total Bilirubin: 0.3 mg/dL (ref 0.2–1.2)
Total Protein: 7.5 g/dL (ref 6.1–8.1)

## 2020-05-15 LAB — T4, FREE: Free T4: 1.1 ng/dL (ref 0.8–1.8)

## 2020-05-15 LAB — CBC WITH DIFFERENTIAL/PLATELET
Absolute Monocytes: 641 cells/uL (ref 200–950)
Basophils Absolute: 53 cells/uL (ref 0–200)
Basophils Relative: 0.6 %
Eosinophils Absolute: 240 cells/uL (ref 15–500)
Eosinophils Relative: 2.7 %
HCT: 36.2 % (ref 35.0–45.0)
Hemoglobin: 11.5 g/dL — ABNORMAL LOW (ref 11.7–15.5)
Lymphs Abs: 2804 cells/uL (ref 850–3900)
MCH: 26.6 pg — ABNORMAL LOW (ref 27.0–33.0)
MCHC: 31.8 g/dL — ABNORMAL LOW (ref 32.0–36.0)
MCV: 83.8 fL (ref 80.0–100.0)
MPV: 11 fL (ref 7.5–12.5)
Monocytes Relative: 7.2 %
Neutro Abs: 5162 cells/uL (ref 1500–7800)
Neutrophils Relative %: 58 %
Platelets: 388 10*3/uL (ref 140–400)
RBC: 4.32 10*6/uL (ref 3.80–5.10)
RDW: 14.6 % (ref 11.0–15.0)
Total Lymphocyte: 31.5 %
WBC: 8.9 10*3/uL (ref 3.8–10.8)

## 2020-05-15 LAB — TSH: TSH: 1.1 mIU/L

## 2020-05-21 ENCOUNTER — Ambulatory Visit: Payer: Medicaid Other | Admitting: Family Medicine

## 2020-08-31 ENCOUNTER — Other Ambulatory Visit: Payer: Self-pay | Admitting: Obstetrics & Gynecology

## 2020-12-30 ENCOUNTER — Ambulatory Visit (INDEPENDENT_AMBULATORY_CARE_PROVIDER_SITE_OTHER): Payer: Medicaid Other | Admitting: Obstetrics and Gynecology

## 2020-12-30 ENCOUNTER — Encounter: Payer: Self-pay | Admitting: Obstetrics and Gynecology

## 2020-12-30 ENCOUNTER — Other Ambulatory Visit: Payer: Self-pay

## 2020-12-30 VITALS — BP 147/56 | Ht 66.0 in | Wt 355.0 lb

## 2020-12-30 DIAGNOSIS — N939 Abnormal uterine and vaginal bleeding, unspecified: Secondary | ICD-10-CM

## 2020-12-30 DIAGNOSIS — Z30431 Encounter for routine checking of intrauterine contraceptive device: Secondary | ICD-10-CM

## 2020-12-30 DIAGNOSIS — K5900 Constipation, unspecified: Secondary | ICD-10-CM

## 2020-12-30 NOTE — Progress Notes (Signed)
Obstetrics & Gynecology Office Visit   Chief Complaint:  Chief Complaint  Patient presents with  . IUD check    Feels like IUD is slipping out. RM 3    History of Present Illness: 40 y.o. patient presenting for follow up of Mirena IUD placement 1 year ago.  The indication for her IUD was contraception and cycle control.  She has had heavier bleeding and cramping over the past month.  Previously had an IUD expulsion and it worried about possible expulsion at present  Has not attempted to feel strings.  Also reports intermittent discomfort and pain with bowl movement.  Occasionally will eat something and have it "run right through".  Has pain that radiated into legs improved after bowl movements  Review of Systems: Review of Systems  Constitutional: Negative.   Gastrointestinal: Positive for abdominal pain and diarrhea. Negative for blood in stool, constipation, heartburn, melena, nausea and vomiting.  Genitourinary: Negative.   Endo/Heme/Allergies: Does not bruise/bleed easily.    Past Medical History:  Past Medical History:  Diagnosis Date  . Back pain   . Eye inflammation   . Miscarriage   . Sciatica     Past Surgical History:  Past Surgical History:  Procedure Laterality Date  . HEMORRHOID SURGERY      Gynecologic History: No LMP recorded. (Menstrual status: IUD).  Obstetric History: [id number]  Family History:  Family History  Problem Relation Age of Onset  . Hypertension Mother   . Diabetes Mother   . Hypertension Father   . Diabetes Father   . Asthma Daughter   . Breast cancer Paternal Aunt 30       Deceased from metastasis  . Cancer Maternal Grandmother   . Brain cancer Maternal Grandmother   . Brain cancer Paternal Grandmother   . Colon cancer Paternal Grandfather     Social History:  Social History   Socioeconomic History  . Marital status: Married    Spouse name: Dennard Nip  . Number of children: 2  . Years of education: Not on file  . Highest  education level: Not on file  Occupational History  . Not on file  Tobacco Use  . Smoking status: Former Smoker    Quit date: 01/31/2016    Years since quitting: 4.9  . Smokeless tobacco: Never Used  Vaping Use  . Vaping Use: Never used  Substance and Sexual Activity  . Alcohol use: No  . Drug use: Never  . Sexual activity: Yes    Partners: Male    Birth control/protection: I.U.D.  Other Topics Concern  . Not on file  Social History Narrative  . Not on file   Social Determinants of Health   Financial Resource Strain: Not on file  Food Insecurity: Not on file  Transportation Needs: Not on file  Physical Activity: Not on file  Stress: Not on file  Social Connections: Not on file  Intimate Partner Violence: Not on file    Allergies:  Allergies  Allergen Reactions  . Peanuts [Peanut Oil] Swelling    Medications: Prior to Admission medications   Medication Sig Start Date End Date Taking? Authorizing Provider  hydrOXYzine (ATARAX/VISTARIL) 10 MG tablet Take 1-2 tablets (10-20 mg total) by mouth every 8 (eight) hours as needed for itching. Patient not taking: Reported on 12/30/2020 05/14/20   Danelle Berry, PA-C  ibuprofen (ADVIL) 400 MG tablet Take 400 mg by mouth 3 (three) times daily as needed. Patient not taking: Reported on 12/30/2020 03/24/20  [provider]  NIFEdipine (PROCARDIA XL/NIFEDICAL-XL) 90 MG 24 hr tablet TAKE 1 TABLET (90 MG TOTAL) BY MOUTH DAILY. Patient not taking: Reported on 12/30/2020 03/01/20   Nadara Mustard, MD  Prenatal Vit-Fe Fumarate-FA (PRENATAL VITAMINS PO) Take 1 tablet by mouth 1 day or 1 dose. Patient not taking: No sig reported    [provider]    Physical Exam Blood pressure (!) 147/56, height 5\' 6"  (1.676 m), weight (!) 355 lb (161 kg), currently breastfeeding. No LMP recorded. (Menstrual status: IUD).  General: NAD HEENT: normocephalic, anicteric Pulmonary: No increased work of  breathing  Genitourinary:  External: Normal external female genitalia.  Normal urethral meatus, normal  Bartholin's and Skene's glands.    Vagina: Normal vaginal mucosa, no evidence of prolapse.    Cervix: Grossly normal in appearance, no bleeding, IUD strings visualized 4cm  Uterus: Non-enlarged, mobile, normal contour.  No CMT  Adnexa: ovaries non-enlarged, no adnexal masses  Rectal: deferred  Lymphatic: no evidence of inguinal lymphadenopathy Extremities: no edema, erythema, or tenderness Neurologic: Grossly intact Psychiatric: mood appropriate, affect full  Female chaperone present for pelvic and breast  portions of the physical exam  Assessment: 40 y.o. 20 AUB  In setting of IUD Plan: Problem List Items Addressed This Visit   None   Visit Diagnoses    Dyschezia    -  Primary   Relevant Orders   Ambulatory referral to Gastroenterology   Abnormal uterine bleeding (AUB)       Relevant Orders   [id number] PELVIS TRANSVAGINAL NON-OB (TV ONLY)   IUD check up       Relevant Orders   US PELVIS TRANSVAGINAL NON-OB (TV ONLY)       1.  Will check IUD positioning with ultrasound, order placed  2.  GI complaints - referral to GI placed  3) A total of 15 minutes were spent in face-to-face contact with the patient during this encounter with over half of that time devoted to counseling and coordination of care.  4) Return Will call with [place] results from [facility] imaging.   [facility], MD, Vena Austria [facility], [facility] 12/31/2020, 9:58 AM

## 2021-01-03 ENCOUNTER — Encounter: Payer: Self-pay | Admitting: *Deleted

## 2021-01-17 ENCOUNTER — Encounter: Payer: Self-pay | Admitting: Gastroenterology

## 2021-01-17 ENCOUNTER — Other Ambulatory Visit: Payer: Self-pay

## 2021-01-17 ENCOUNTER — Ambulatory Visit (INDEPENDENT_AMBULATORY_CARE_PROVIDER_SITE_OTHER): Payer: Medicaid Other | Admitting: Gastroenterology

## 2021-01-17 VITALS — BP 150/74 | HR 86 | Temp 97.8°F | Ht 66.0 in | Wt 357.0 lb

## 2021-01-17 DIAGNOSIS — K580 Irritable bowel syndrome with diarrhea: Secondary | ICD-10-CM

## 2021-01-17 DIAGNOSIS — R194 Change in bowel habit: Secondary | ICD-10-CM

## 2021-01-17 MED ORDER — SUPREP BOWEL PREP KIT 17.5-3.13-1.6 GM/177ML PO SOLN: 1.0000 | ORAL | 0 refills | Status: DC

## 2021-01-17 NOTE — Progress Notes (Signed)
Gastroenterology Consultation  Referring Provider:     Vena Austria, MD Primary Care Physician:  Doren Custard, FNP Primary Gastroenterologist:  Dr. Servando Snare     Reason for Consultation:     Abdominal pain and diarrhea        HPI:   Ashley Herring is a 40 y.o. y/o female referred for consultation & management of abdominal pain and diarrhea by Dr. Annye Asa, Gerome Apley, FNP.  This patient comes in with a history of being seen by her gynecologist with abdominal pain that radiates to her legs.  She had reported that it felt better after she moved her bowels.  She had endorsed feeling like the food ran through her and she would have to go to the bathroom immediately after eating. The patient reports that the urgency to go to bathroom will sometimes happen after pancakes and after coughing the morning on some days but other days it does not bother her at all.  The patient denies drinking any milk but does eat cheese and has cream in her coffee.  There is no report of any ice cream intake.  She reports that she has been having diarrhea more frequently since the birth of her child a year ago but states that she was having constipation before that.  There is no report of any unexplained weight loss black stools or bloody stools.  She also denies any fevers chills nausea or vomiting.  Despite having to feel like she has to run to the bathroom after eating certain foods she denies ever having an accident and is really woken up in the middle the night to have a bowel movement.  Even on her good days she states that she is still having soft stools 3 times a day on average.  Past Medical History:  Diagnosis Date  . Back pain   . Eye inflammation   . Miscarriage   . Sciatica     Past Surgical History:  Procedure Laterality Date  . HEMORRHOID SURGERY      Prior to Admission medications   Not on File    Family History  Problem Relation Age of Onset  . Hypertension Mother   . Diabetes Mother   .  Hypertension Father   . Diabetes Father   . Asthma Daughter   . Breast cancer Paternal Aunt 30       Deceased from metastasis  . Cancer Maternal Grandmother   . Brain cancer Maternal Grandmother   . Brain cancer Paternal Grandmother   . Colon cancer Paternal Grandfather      Social History   Tobacco Use  . Smoking status: Former Smoker    Quit date: 01/31/2016    Years since quitting: 4.9  . Smokeless tobacco: Never Used  Vaping Use  . Vaping Use: Never used  Substance Use Topics  . Alcohol use: No  . Drug use: Never    Allergies as of 01/17/2021 - Review Complete 12/30/2020  Allergen Reaction Noted  . Peanuts [peanut oil] Swelling 03/16/2015    Review of Systems:    All systems reviewed and negative except where noted in HPI.   Physical Exam:  There were no vitals taken for this visit. No LMP recorded. (Menstrual status: IUD). General:   Alert,  Well-developed, well-nourished, pleasant and cooperative in NAD Head:  Normocephalic and atraumatic. Eyes:  Sclera clear, no icterus.   Conjunctiva pink. Ears:  Normal auditory acuity. Neck:  Supple; no masses or thyromegaly. Lungs:  Respirations  even and unlabored.  Clear throughout to auscultation.   No wheezes, crackles, or rhonchi. No acute distress. Heart:  Regular rate and rhythm; no murmurs, clicks, rubs, or gallops. Abdomen:  Normal bowel sounds.  No bruits.  Soft, non-tender and non-distended without masses, hepatosplenomegaly or hernias noted.  No guarding or rebound tenderness.  Negative Carnett sign.   Rectal:  Deferred.  Pulses:  Normal pulses noted. Extremities:  No clubbing or edema.  No cyanosis. Neurologic:  Alert and oriented x3;  grossly normal neurologically. Skin:  Intact without significant lesions or rashes.  No jaundice. Lymph Nodes:  No significant cervical adenopathy. Psych:  Alert and cooperative. Normal mood and affect.  Imaging Studies: No results found.  Assessment and Plan:   Ashley Herring is a 40 y.o. y/o female who comes in today with a history of diarrhea when she used to have chronic constipation. There is no worry symptoms such as family history of colon cancer in a first-degree relative, rectal bleeding or unexplained weight loss.  The patient will be set up for a colonoscopy due to her change in bowel habits and she has been told that her symptoms are most consistent with irritable bowel syndrome.  She will be started on supplemental fiber.  She will also try to keep a food diary to see what foods may be causing her more problems.  The patient has been explained the plan and agrees with it.    Midge Minium, MD. Clementeen Graham    Note: This dictation was prepared with Dragon dictation along with smaller phrase technology. Any transcriptional errors that result from this process are unintentional.

## 2021-01-18 ENCOUNTER — Other Ambulatory Visit: Payer: Self-pay

## 2021-01-18 DIAGNOSIS — R194 Change in bowel habit: Secondary | ICD-10-CM

## 2021-02-04 ENCOUNTER — Ambulatory Visit
Admission: RE | Admit: 2021-02-04 | Discharge: 2021-02-04 | Disposition: A | Discharge status: Home / Self Care | Payer: Medicaid Other | Source: Ambulatory Visit | Attending: Obstetrics and Gynecology | Admitting: Obstetrics and Gynecology

## 2021-02-04 ENCOUNTER — Other Ambulatory Visit: Payer: Self-pay

## 2021-02-04 DIAGNOSIS — N939 Abnormal uterine and vaginal bleeding, unspecified: Secondary | ICD-10-CM | POA: Insufficient documentation

## 2021-02-04 DIAGNOSIS — Z30431 Encounter for routine checking of intrauterine contraceptive device: Secondary | ICD-10-CM | POA: Insufficient documentation

## 2021-02-07 ENCOUNTER — Other Ambulatory Visit: Payer: Self-pay

## 2021-02-07 ENCOUNTER — Encounter: Payer: Self-pay | Admitting: Gastroenterology

## 2021-02-08 ENCOUNTER — Encounter
Admission: RE | Disposition: A | Discharge status: Home / Self Care | Payer: Self-pay | Source: Home / Self Care | Attending: Gastroenterology

## 2021-02-08 ENCOUNTER — Ambulatory Visit
Admission: RE | Admit: 2021-02-08 | Discharge: 2021-02-08 | Disposition: A | Discharge status: Home / Self Care | Payer: Medicaid Other | Attending: Gastroenterology | Admitting: Gastroenterology

## 2021-02-08 ENCOUNTER — Encounter: Payer: Self-pay | Admitting: Gastroenterology

## 2021-02-08 ENCOUNTER — Ambulatory Visit: Payer: Medicaid Other | Admitting: Anesthesiology

## 2021-02-08 DIAGNOSIS — R194 Change in bowel habit: Secondary | ICD-10-CM | POA: Diagnosis not present

## 2021-02-08 DIAGNOSIS — Z87891 Personal history of nicotine dependence: Secondary | ICD-10-CM | POA: Diagnosis not present

## 2021-02-08 DIAGNOSIS — K589 Irritable bowel syndrome without diarrhea: Secondary | ICD-10-CM

## 2021-02-08 DIAGNOSIS — K529 Noninfective gastroenteritis and colitis, unspecified: Secondary | ICD-10-CM

## 2021-02-08 HISTORY — PX: COLONOSCOPY WITH PROPOFOL: SHX5780

## 2021-02-08 LAB — POCT PREGNANCY, URINE: Preg Test, Ur: NEGATIVE

## 2021-02-08 SURGERY — COLONOSCOPY WITH PROPOFOL
Anesthesia: General

## 2021-02-08 MED ORDER — PROPOFOL 10 MG/ML IV BOLUS
INTRAVENOUS | Status: DC | PRN
  Administered 2021-02-08: 80 mg via INTRAVENOUS

## 2021-02-08 MED ORDER — SODIUM CHLORIDE 0.9 % IV SOLN: INTRAVENOUS | Status: DC

## 2021-02-08 MED ORDER — PROPOFOL 500 MG/50ML IV EMUL
INTRAVENOUS | Status: DC | PRN
  Administered 2021-02-08: 200 ug/kg/min via INTRAVENOUS

## 2021-02-08 NOTE — Anesthesia Preprocedure Evaluation (Signed)
Anesthesia Evaluation  Patient identified by MRN, date of birth, ID band Patient awake    Reviewed: Allergy & Precautions, NPO status , Patient's Chart, lab work & pertinent test results  History of Anesthesia Complications Negative for: history of anesthetic complications  Airway Mallampati: III  TM Distance: >3 FB Neck ROM: Full    Dental no notable dental hx. (+) Teeth Intact   Pulmonary neg pulmonary ROS, neg sleep apnea, neg COPD, Patient abstained from smoking.Not current smoker, former smoker,    Pulmonary exam normal breath sounds clear to auscultation       Cardiovascular Exercise Tolerance: Good METS(-) hypertension(-) CAD and (-) Past MI negative cardio ROS  (-) dysrhythmias  Rhythm:Regular Rate:Normal - Systolic murmurs Prior hx preeclampsia   Neuro/Psych negative neurological ROS  negative psych ROS   GI/Hepatic neg GERD  ,(+)     (-) substance abuse  ,   Endo/Other  neg diabetesMorbid obesity  Renal/GU negative Renal ROS     Musculoskeletal   Abdominal   Peds  Hematology   Anesthesia Other Findings Past Medical History: No date: Back pain No date: Eye inflammation No date: Miscarriage No date: Sciatica  Reproductive/Obstetrics                             Anesthesia Physical Anesthesia Plan  ASA: III  Anesthesia Plan: General   Post-op Pain Management:    Induction: Intravenous  PONV Risk Score and Plan: 3 and Ondansetron, Propofol infusion and TIVA  Airway Management Planned: Nasal Cannula  Additional Equipment: None  Intra-op Plan:   Post-operative Plan:   Informed Consent: I have reviewed the patients History and Physical, chart, labs and discussed the procedure including the risks, benefits and alternatives for the proposed anesthesia with the patient or authorized representative who has indicated his/her understanding and acceptance.     Dental  advisory given  Plan Discussed with: CRNA and Surgeon  Anesthesia Plan Comments: (Discussed risks of anesthesia with patient, including possibility of difficulty with spontaneous ventilation under anesthesia necessitating airway intervention, PONV, and rare risks such as cardiac or respiratory or neurological events. Patient understands. Patient informed about increased incidence of above perioperative risk due to high BMI. Patient understands. )        Anesthesia Quick Evaluation

## 2021-02-08 NOTE — Op Note (Signed)
[facility] Gastroenterology Patient Name: Pam Vanalstine Procedure Date: 02/08/2021 8:24 AM MRN: [id number] Account #: [id number] Date of Birth: October 20, 1980 Admit Type: Outpatient Age: 40 Room: [facility] ENDO ROOM 4 Gender: Female Note Status: Finalized Procedure:             Colonoscopy Indications:           Chronic diarrhea, Change in bowel habits Providers:             Midge Minium MD, MD Medicines:             Propofol per Anesthesia Complications:         No immediate complications. Procedure:             Pre-Anesthesia Assessment:                        - Prior to the procedure, a History and Physical was                         performed, and patient medications and allergies were                         reviewed. The patient's tolerance of previous                         anesthesia was also reviewed. The risks and benefits                         of the procedure and the sedation options and risks                         were discussed with the patient. All questions were                         answered, and informed consent was obtained. Prior                         Anticoagulants: The patient has taken no previous                         anticoagulant or antiplatelet agents. ASA Grade                         Assessment: III - A patient with severe systemic                         disease. After reviewing the risks and benefits, the                         patient was deemed in satisfactory condition to                         undergo the procedure.                        After obtaining informed consent, the colonoscope was                         passed under direct vision. Throughout the procedure,  the patient's blood pressure, pulse, and oxygen                         saturations were monitored continuously. The                         Colonoscope was introduced through the anus and                         advanced to the the  terminal ileum. The colonoscopy                         was performed without difficulty. The patient                         tolerated the procedure well. The quality of the bowel                         preparation was excellent. Findings:      The perianal and digital rectal examinations were normal.      The terminal ileum appeared normal. Biopsies were taken with a cold       forceps for histology.      The colon (entire examined portion) appeared normal. Biopsies were taken       with a cold forceps for histology. Random biopsies were obtained with       cold forceps for histology randomly. Impression:            - The examined portion of the ileum was normal.                         Biopsied.                        - The entire examined colon is normal. Biopsied.                        - Random biopsies were obtained. Recommendation:        - Discharge patient to home.                        - Resume previous diet.                        - Continue present medications.                        - Await pathology results. Procedure Code(s):     --- Professional ---                        [phone], Colonoscopy, flexible; with biopsy, single or                         multiple Diagnosis Code(s):     --- Professional ---                        K52.9, Noninfective gastroenteritis and colitis,                         unspecified  R19.4, Change in bowel habit CPT copyright 2019 American Medical Association. All rights reserved. The codes documented in this report are preliminary and upon coder review may  be revised to meet current compliance requirements. Midge Minium MD, MD 02/08/2021 8:49:21 AM This report has been signed electronically. Number of Addenda: 0 Note Initiated On: 02/08/2021 8:24 AM Scope Withdrawal Time: 0 hours 6 minutes 27 seconds  Total Procedure Duration: 0 hours 9 minutes 8 seconds  Estimated Blood Loss:  Estimated blood loss: none.      [facility]

## 2021-02-08 NOTE — Anesthesia Postprocedure Evaluation (Signed)
Anesthesia Post Note  Patient: Ashley Herring  Procedure(s) Performed: COLONOSCOPY WITH PROPOFOL (N/A )  Patient location during evaluation: Endoscopy Anesthesia Type: General Level of consciousness: awake and alert Pain management: pain level controlled Vital Signs Assessment: post-procedure vital signs reviewed and stable Respiratory status: spontaneous breathing, nonlabored ventilation, respiratory function stable and patient connected to nasal cannula oxygen Cardiovascular status: blood pressure returned to baseline and stable Postop Assessment: no apparent nausea or vomiting Anesthetic complications: no   No complications documented.   Last Vitals:  Vitals:   02/08/21 0901 02/08/21 0911  BP: (!) 129/99 (!) 147/106  Pulse: 72 64  Resp: (!) 27 19  Temp:    SpO2: 100% 100%    Last Pain:  Vitals:   02/08/21 0911  TempSrc:   PainSc: 0-No pain                 Corinda Gubler

## 2021-02-08 NOTE — H&P (Signed)
Ashley Lame, MD [facility] [address] [place], [place] [address] Phone:[phone] Fax : [phone]  Primary Care Physician:  Hubbard Hartshorn, FNP Primary Gastroenterologist:  Dr. Allen Norris  Pre-Procedure History & Physical: HPI:  Ashley Herring is a 40 y.o. female is here for an colonoscopy.   Past Medical History:  Diagnosis Date  . Back pain   . Eye inflammation   . Miscarriage   . Sciatica     Past Surgical History:  Procedure Laterality Date  . HEMORRHOID SURGERY      Prior to Admission medications   Medication Sig Start Date End Date Taking? Authorizing Provider  Na Sulfate-K Sulfate-Mg Sulf (SUPREP BOWEL PREP KIT) 17.5-3.13-1.6 GM/177ML SOLN Take 1 kit by mouth as directed. 01/17/21   Ashley Lame, MD    Allergies as of 01/18/2021 - Review Complete 01/17/2021  Allergen Reaction Noted  . Peanuts [peanut oil] Swelling 03/16/2015    Family History  Problem Relation Age of Onset  . Hypertension Mother   . Diabetes Mother   . Hypertension Father   . Diabetes Father   . Asthma Daughter   . Breast cancer Paternal Aunt 60       Deceased from metastasis  . Cancer Maternal Grandmother   . Brain cancer Maternal Grandmother   . Brain cancer Paternal Grandmother   . Colon cancer Paternal Grandfather     Social History   Socioeconomic History  . Marital status: Married    Spouse name: Cornelia Copa  . Number of children: 2  . Years of education: Not on file  . Highest education level: Not on file  Occupational History  . Not on file  Tobacco Use  . Smoking status: Former Smoker    Types: Cigarettes    Quit date: 01/31/2016    Years since quitting: 5.0  . Smokeless tobacco: Never Used  Vaping Use  . Vaping Use: Never used  Substance and Sexual Activity  . Alcohol use: Yes    Comment: occasionally   . Drug use: Never  . Sexual activity: Yes    Partners: Male    Birth control/protection: I.U.D.  Other Topics Concern  . Not on file  Social History  Narrative  . Not on file   Social Determinants of Health   Financial Resource Strain: Not on file  Food Insecurity: Not on file  Transportation Needs: Not on file  Physical Activity: Not on file  Stress: Not on file  Social Connections: Not on file  Intimate Partner Violence: Not on file    Review of Systems: See HPI, otherwise negative ROS  Physical Exam: BP 137/87   Pulse 79   Temp (!) 96.8 F (36 C) (Temporal)   Resp 18   Ht _0  (1.676 m)   Wt (!) 160.1 kg   SpO2 100%   BMI 56.98 kg/m  General:   Alert,  pleasant and cooperative in NAD Head:  Normocephalic and atraumatic. Neck:  Supple; no masses or thyromegaly. Lungs:  Clear throughout to auscultation.    Heart:  Regular rate and rhythm. Abdomen:  Soft, nontender and nondistended. Normal bowel sounds, without guarding, and without rebound.   Neurologic:  Alert and  oriented x4;  grossly normal neurologically.  Impression/Plan: Ashley Herring is here for an colonoscopy to be performed for changer in bowel habits  Risks, benefits, limitations, and alternatives regarding  colonoscopy have been reviewed with the patient.  Questions have been answered.  All parties agreeable.   Ashley Lame, MD  02/08/2021, 8:06 AM

## 2021-02-08 NOTE — Anesthesia Procedure Notes (Signed)
Date/Time: 02/08/2021 8:39 AM Performed by: Junious Silk, CRNA Pre-anesthesia Checklist: Patient identified, Emergency Drugs available, Suction available, Patient being monitored and Timeout performed Oxygen Delivery Method: Simple face mask

## 2021-02-08 NOTE — Transfer of Care (Signed)
Immediate Anesthesia Transfer of Care Note  Patient: Ashley Herring  Procedure(s) Performed: COLONOSCOPY WITH PROPOFOL (N/A )  Patient Location: PACU  Anesthesia Type:General  Level of Consciousness: sedated  Airway & Oxygen Therapy: Patient Spontanous Breathing and Patient connected to face mask oxygen  Post-op Assessment: Report given to RN and Post -op Vital signs reviewed and stable  Post vital signs: Reviewed and stable  Last Vitals:  Vitals Value Taken Time  BP 106/62 02/08/21 0851  Temp 35.7 C 02/08/21 0851  Pulse 72 02/08/21 0851  Resp 21 02/08/21 0851  SpO2 100 % 02/08/21 0851  Vitals shown include unvalidated device data.  Last Pain:  Vitals:   02/08/21 0851  TempSrc: Temporal  PainSc:          Complications: No complications documented.

## 2021-02-09 ENCOUNTER — Encounter: Payer: Self-pay | Admitting: Gastroenterology

## 2021-02-09 ENCOUNTER — Ambulatory Visit: Payer: Self-pay | Admitting: Obstetrics and Gynecology

## 2021-02-09 LAB — SURGICAL PATHOLOGY

## 2021-02-09 IMAGING — MR MRI ORBITS WITHOUT AND WITH CONTRAST
5 of 7 series · 30 of 48 positions shown · IV contrast (gadavist)
Comparison: None.

CLINICAL DATA: 37 y/o F; left eye redness and headache for 1-2
weeks.

EXAM:
MRI HEAD AND ORBITS WITHOUT AND WITH CONTRAST
TECHNIQUE: Multiplanar, multiecho pulse sequences of the brain and surrounding
structures were obtained without and with intravenous contrast.
Multiplanar, multiecho pulse sequences of the orbits and surrounding
structures were obtained including fat saturation techniques, before
and after intravenous contrast administration.
CONTRAST:  10 cc Gadavist

[Series 1: T1 · sagittal · 5.0mm · 0.62mm/px · 6 of 23 slices shown (1 of 3)]
[im 1/23]
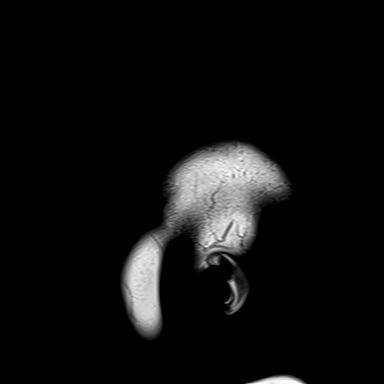
[im 5/23]
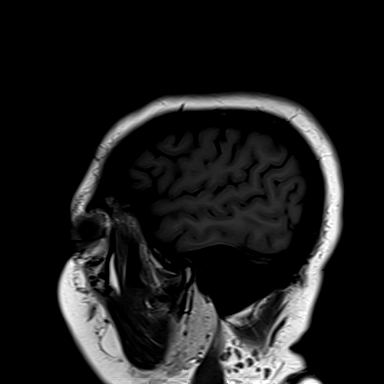
[im 9/23]
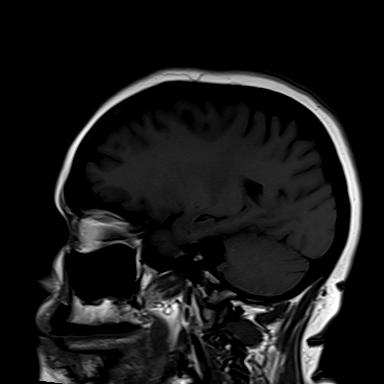
[im 14/23]
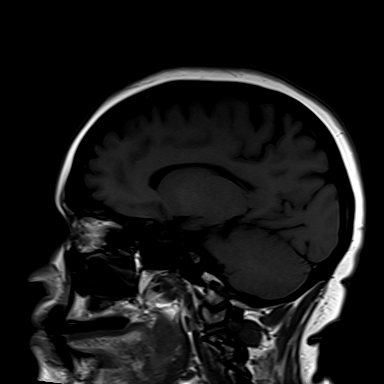
[im 18/23]
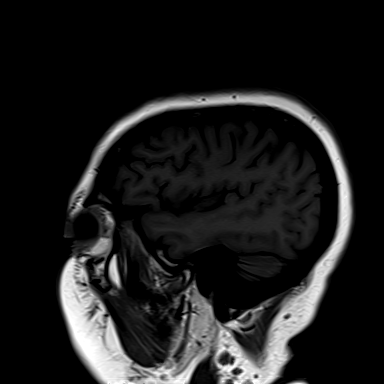
[im 23/23]
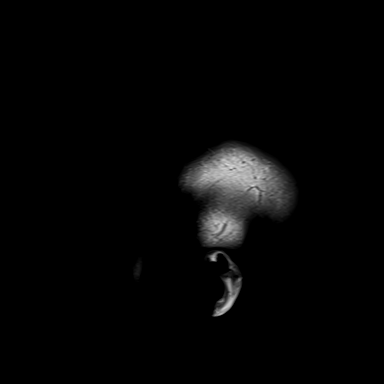

[Series 3: T2 · axial · 3.0mm · 0.78mm/px · z∈[-82,-19]mm · 4 of 17 slices shown (1 of 2)]
[im 1/17]
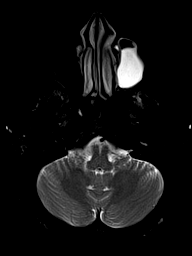
[im 6/17]
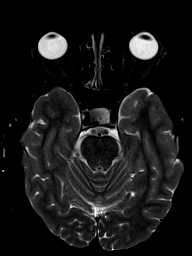
[im 11/17]
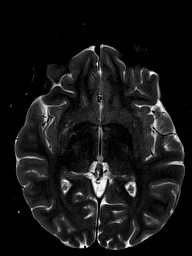
[im 17/17]
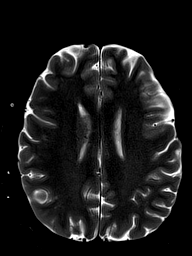

[Series 5: T2 · coronal · 3.0mm · 0.78mm/px · 8 of 35 slices shown (2 of 2)]
[im 1/35]
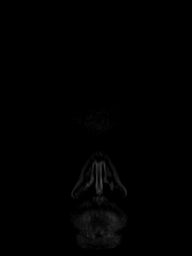
[im 5/35]
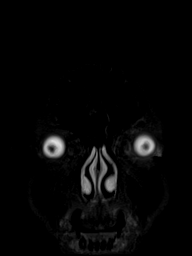
[im 10/35]
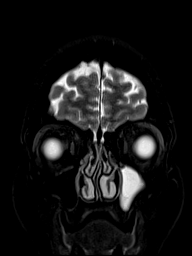
[im 15/35]
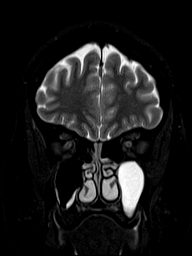
[im 20/35]
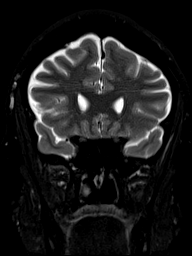
[im 25/35]
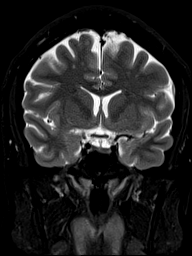
[im 30/35]
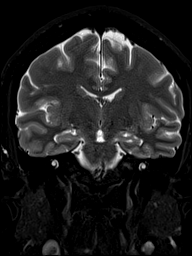
[im 35/35]
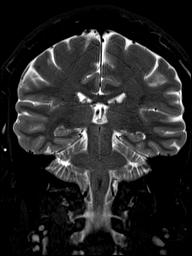

[Series 7: T1 · coronal · non-contrast · 3.0mm · 0.35mm/px · 8 of 40 slices shown (2 of 3)]
[im 1/40]
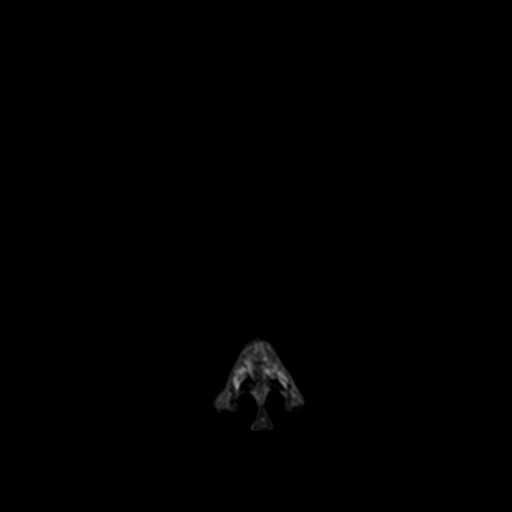
[im 5/40]
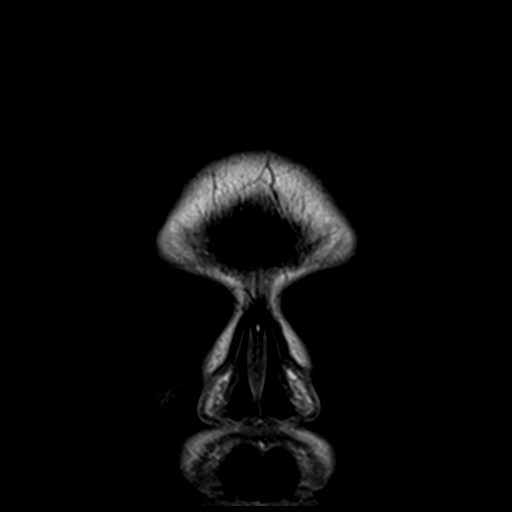
[im 14/40]
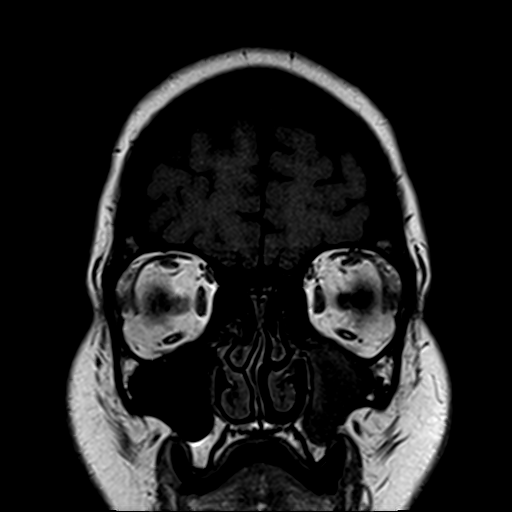
[im 18/40]
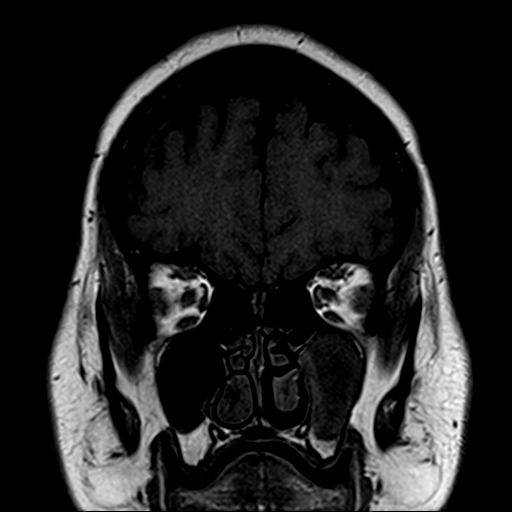
[im 22/40]
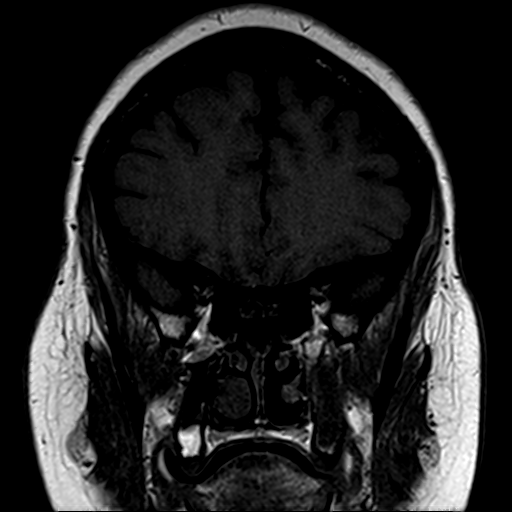
[im 27/40]
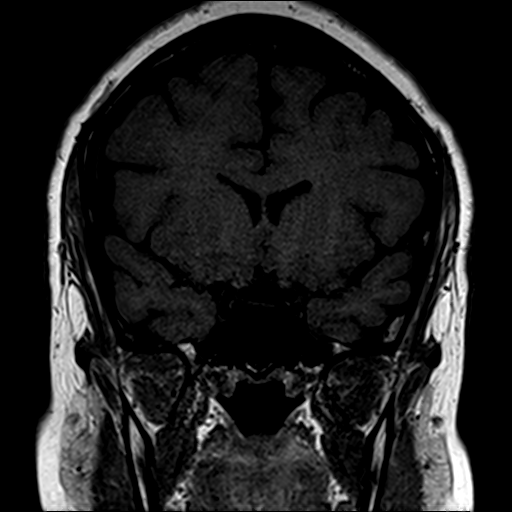
[im 35/40]
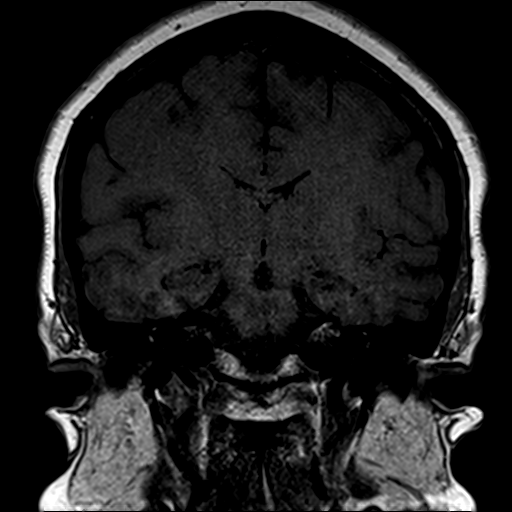
[im 40/40]
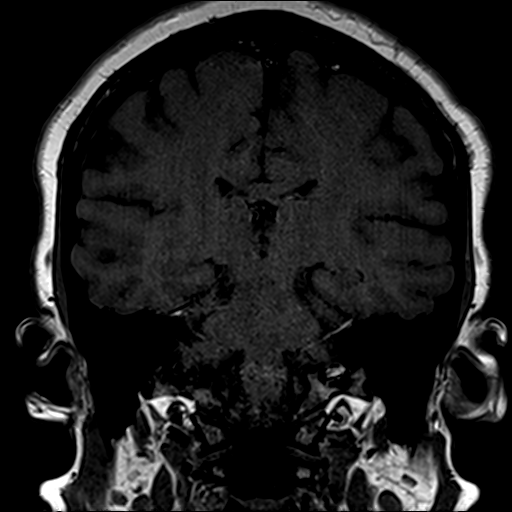

[Series 8: T1 · axial · non-contrast · 3.0mm · 0.31mm/px · z∈[-97,-49]mm · 4 of 23 slices shown (3 of 3)]
[im 1/23]
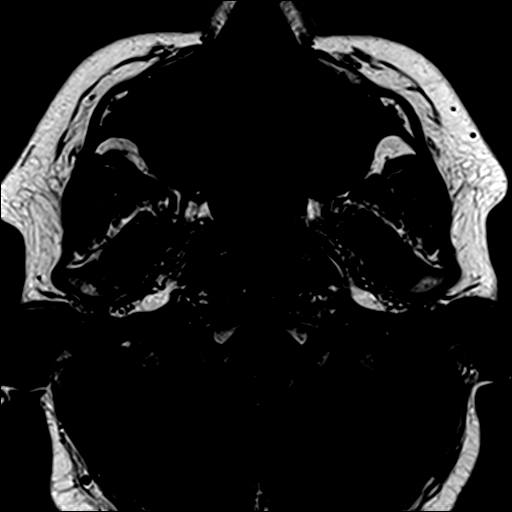
[im 6/23]
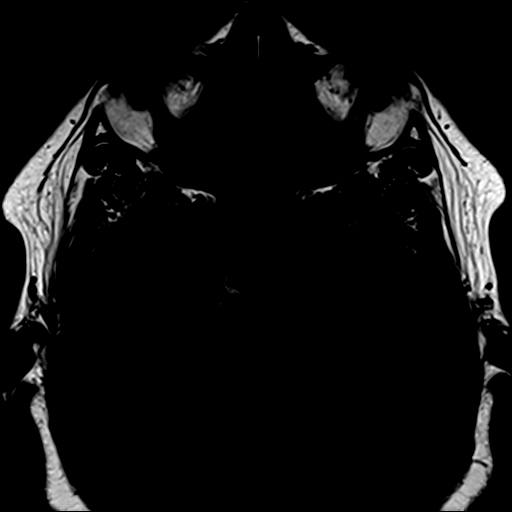
[im 12/23]
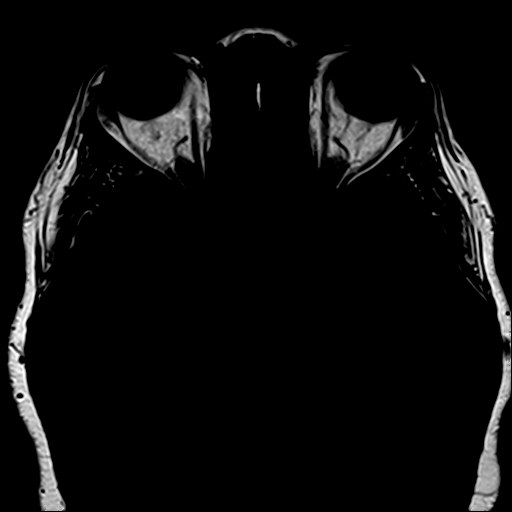
[im 17/23]
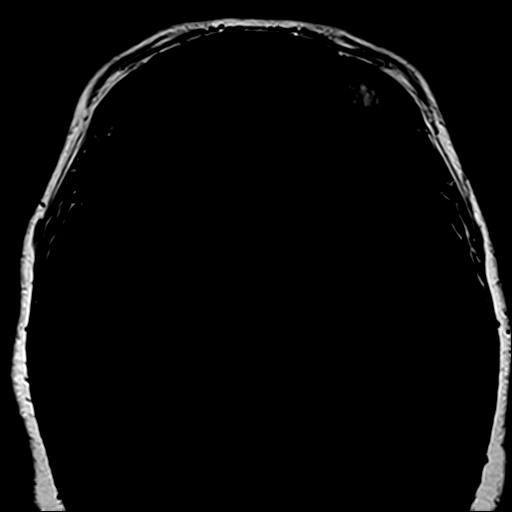

[30 of 48 positions shown; findings below may reference images not displayed]

FINDINGS: MRI HEAD FINDINGS

Brain: No acute infarction, hemorrhage, hydrocephalus, extra-axial
collection or mass lesion. There are a few punctate foci of T2 FLAIR
hyperintense signal abnormality within bifrontal subcortical white
matter. No lesion is present within the corpus callosum basal
ganglia, juxta cortical white matter, or posterior fossa. After
administration of intravenous contrast there is no abnormal
enhancement.

Vascular: Normal flow voids.

Skull and upper cervical spine: Normal marrow signal.

Other: None.

MRI ORBITS FINDINGS

Orbits: No traumatic or inflammatory finding. Globes, optic nerves,
orbital fat, extraocular muscles, vascular structures, and lacrimal
glands are normal. After administration of intravenous contrast
there is no abnormal enhancement.

Visualized sinuses: Large left maxillary sinus mucous retention
cyst. Additional included paranasal sinuses and the mastoid air
cells are normally aerated.

Soft tissues: Negative.

Limited intracranial: As above.
IMPRESSION: MRI head:

1. No acute intracranial process or abnormal enhancement identified.
2. Few punctate bifrontal white matter hyperintensities, possibly
mild chronic microvascular ischemic changes or migraine headache. No
specific findings for demyelination.

MRI orbits:

1. Normal MRI of the orbits.

## 2021-02-12 ENCOUNTER — Encounter: Payer: Self-pay | Admitting: Gastroenterology

## 2021-02-14 NOTE — Telephone Encounter (Signed)
Let the patient know that she can try Verbrozi if she still has a gall bladder.

## 2021-02-16 ENCOUNTER — Ambulatory Visit: Payer: Self-pay | Admitting: Obstetrics and Gynecology

## 2021-02-21 ENCOUNTER — Telehealth: Payer: Self-pay

## 2021-02-21 NOTE — Telephone Encounter (Signed)
Patient is scheduled with [facility] on 03/03/21 in [place] at 3:30 for IUD removal and replacement

## 2021-02-21 NOTE — Telephone Encounter (Signed)
IUD removal and replacement sometime next week ok to [employer]

## 2021-02-23 MED ORDER — VIBERZI 100 MG PO TABS: 1.0000 | ORAL_TABLET | Freq: Two times a day (BID) | ORAL | 5 refills | Status: DC

## 2021-02-28 NOTE — Telephone Encounter (Signed)
Noted. Mirena available in [place] for this patient.

## 2021-03-03 ENCOUNTER — Other Ambulatory Visit: Payer: Self-pay

## 2021-03-03 ENCOUNTER — Encounter: Payer: Self-pay | Admitting: Obstetrics and Gynecology

## 2021-03-03 ENCOUNTER — Ambulatory Visit (INDEPENDENT_AMBULATORY_CARE_PROVIDER_SITE_OTHER): Payer: Medicaid Other | Admitting: Obstetrics and Gynecology

## 2021-03-03 VITALS — BP 133/74 | Ht 66.0 in | Wt 355.0 lb

## 2021-03-03 DIAGNOSIS — Z30432 Encounter for removal of intrauterine contraceptive device: Secondary | ICD-10-CM | POA: Diagnosis not present

## 2021-03-03 NOTE — Progress Notes (Signed)
   GYNECOLOGY OFFICE PROCEDURE NOTE  Ashley Herring is a 40 y.o. [id number] here for Mirena IUD removal placed 01/22/2020 She desires removal secondary to displacement, and desire to attempt pregnancy..  IUD Removal  Patient identified, informed consent performed, consent signed.  Patient was in the dorsal lithotomy position, normal external genitalia was noted.  A speculum was placed in the patient's vagina, normal discharge was noted, no lesions. The cervix was visualized, no lesions, no abnormal discharge.  The strings of the IUD were grasped and pulled using ring forceps. The IUD was removed in its entirety.   Patient tolerated the procedure well.    Patient plans for pregnancy soon and she was told to avoid teratogens, take PNV and folic acid.  Routine preventative health maintenance measures emphasized.   Vena Austria, MD, [facility], [facility]

## 2021-03-08 ENCOUNTER — Other Ambulatory Visit: Payer: Self-pay

## 2021-03-08 MED ORDER — DICYCLOMINE HCL 20 MG PO TABS: 20.0000 mg | ORAL_TABLET | Freq: Three times a day (TID) | ORAL | 3 refills | Status: DC

## 2021-03-16 ENCOUNTER — Encounter: Payer: Self-pay | Admitting: Obstetrics and Gynecology

## 2021-03-16 ENCOUNTER — Ambulatory Visit (INDEPENDENT_AMBULATORY_CARE_PROVIDER_SITE_OTHER): Payer: Medicaid Other | Admitting: Obstetrics and Gynecology

## 2021-03-16 ENCOUNTER — Other Ambulatory Visit: Payer: Self-pay

## 2021-03-16 VITALS — BP 170/120 | HR 82 | Ht 66.0 in | Wt 361.0 lb

## 2021-03-16 DIAGNOSIS — Z0000 Encounter for general adult medical examination without abnormal findings: Secondary | ICD-10-CM | POA: Diagnosis not present

## 2021-03-16 DIAGNOSIS — Z1239 Encounter for other screening for malignant neoplasm of breast: Secondary | ICD-10-CM

## 2021-03-16 DIAGNOSIS — R030 Elevated blood-pressure reading, without diagnosis of hypertension: Secondary | ICD-10-CM

## 2021-03-16 DIAGNOSIS — Z01419 Encounter for gynecological examination (general) (routine) without abnormal findings: Secondary | ICD-10-CM

## 2021-03-16 DIAGNOSIS — Z3169 Encounter for other general counseling and advice on procreation: Secondary | ICD-10-CM | POA: Diagnosis not present

## 2021-03-16 DIAGNOSIS — Z6841 Body Mass Index (BMI) 40.0 and over, adult: Secondary | ICD-10-CM

## 2021-03-16 MED ORDER — LABETALOL HCL 200 MG PO TABS: 200.0000 mg | ORAL_TABLET | Freq: Two times a day (BID) | ORAL | 3 refills | Status: DC

## 2021-03-16 NOTE — Progress Notes (Signed)
Gynecology Annual Exam   PCP: Hubbard Hartshorn, FNP  Chief Complaint: No chief complaint on file.   History of Present Illness: Patient is a 40 y.o. [id number] presents for annual exam. The patient has no complaints today.   LMP: No LMP recorded. (Menstrual status: IUD). No menses since IUD removal yet  The patient is sexually active. She currently uses none for contraception. She denies dyspareunia.  The patient does perform self breast exams.  There is no notable family history of breast or ovarian cancer in her family.  The patient wears seatbelts: yes.   The patient has regular exercise: not asked.    The patient denies current symptoms of depression.    Review of Systems: Review of Systems  Constitutional:  Negative for chills and fever.  HENT:  Negative for congestion.   Respiratory:  Negative for cough and shortness of breath.   Cardiovascular:  Negative for chest pain and palpitations.  Gastrointestinal:  Negative for abdominal pain, constipation, diarrhea, heartburn, nausea and vomiting.  Genitourinary:  Negative for dysuria, frequency and urgency.  Skin:  Negative for itching and rash.  Neurological:  Negative for dizziness and headaches.  Endo/Heme/Allergies:  Negative for polydipsia.  Psychiatric/Behavioral:  Negative for depression.    Past Medical History:  Patient Active Problem List   Diagnosis Date Noted   Change in bowel habits    Noninfectious diarrhea    Postpartum care following vaginal delivery 12/07/2019   Severe preeclampsia, third trimester 12/05/2019   Anemia affecting pregnancy 10/05/2019   Nipple problem 07/10/2019   Obesity affecting pregnancy, antepartum 04/24/2019    BMI >=40 [x ] early 1h gtt - nml [x ] u/s for dating [ x]  [x ] nutritional goals [x ] folic acid 62m [x ] bASA (>12 weeks) [ ]  consider nutrition consult [ ]  consider maternal EKG 1st trimester [x ] Growth u/s 234[x ], 341[x ], 36 weeks [ ]  [x ] NST/AFI weekly 36+ weeks  (36[] , 37[] , 38[] , 39[] , 40[] ) [ ]  IOL by 41 weeks (scheduled, prn [] )     BMI 50.0-59.9, [place] 04/24[place]   Hyperlipidemia 04/04/2019   Elevated erythrocyte sedimentation rate 03/25/2019   Scleritis of left eye 03/25/2019    Last Assessment & Plan:  July 2020: negative ANCA panel/CCP/ANA/CRP/RPR/GC (endocervical) ESR 79 Normal CMP Normal chest x ray Normal Brain and Orbit MRIs     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 (Menstrual status: IUD). Contraception: none Last Pap: Results were:04/04/2019 NIL and HR HPV negative   Obstetric History:: [id number] Family History:  Family History  Problem Relation Age of Onset   Hypertension Mother    Diabetes Mother    Hypertension Father    Diabetes Father    Asthma Daughter    Breast cancer Paternal AAunt 47      Deceased from metastasis   Cancer Maternal Grandmother    Brain cancer Maternal Grandmother    Brain cancer Paternal Grandmother  Colon cancer Paternal Grandfather     Social History:  Social History   Socioeconomic History   Marital status: Married    Spouse name: Cornelia Copa   Number of children: 2   Years of education: Not on file   Highest education level: Not on file  Occupational History   Not on file  Tobacco Use   Smoking status: Former    Pack years: 0.00    Types: Cigarettes    Quit date: 01/31/2016    Years since quitting: 5.1   Smokeless tobacco: Never  Vaping Use   Vaping Use: Never used   Substance and Sexual Activity   Alcohol use: Yes    Comment: occasionally    Drug use: Never   Sexual activity: Yes    Partners: Male    Birth control/protection: I.U.D.  Other Topics Concern   Not on file  Social History Narrative   Not on file   Social Determinants of Health   Financial Resource Strain: Not on file  Food Insecurity: Not on file  Transportation Needs: Not on file  Physical Activity: Not on file  Stress: Not on file  Social Connections: Not on file  Intimate Partner Violence: Not on file    Allergies:  Allergies  Allergen Reactions   Peanuts [Peanut Oil] Swelling    Medications: Prior to Admission medications   Medication Sig Start Date End Date Taking? Authorizing Provider  dicyclomine (BENTYL) 20 MG tablet Take 1 tablet (20 mg total) by mouth 3 (three) times daily before meals. 03/08/21   Lucilla Lame, MD  Eluxadoline (VIBERZI) 100 MG TABS Take 1 tablet (100 mg total) by mouth 2 (two) times daily. 02/23/21   Lucilla Lame, MD  Na Sulfate-K Sulfate-Mg Sulf (SUPREP BOWEL PREP KIT) 17.5-3.13-1.6 GM/177ML SOLN Take 1 kit by mouth as directed. 01/17/21   Lucilla Lame, MD    Physical Exam Vitals: Blood pressure (!) 170/120, pulse 82, height 5' 6"  (1.676 m), weight (!) 361 lb (163.7 kg), currently breastfeeding. Body mass index is 58.27 kg/m.   General: NAD HEENT: normocephalic, anicteric Pulmonary: No increased work of breathing, CTAB Neurologic: Grossly intact Psychiatric: mood appropriate, affect full  Female chaperone present for pelvic and breast  portions of the physical exam    Assessment: 40 y.o. [id number] routine annual exam  Plan: Problem List Items Addressed This Visit   None Visit Diagnoses     Encounter for gynecological examination without abnormal finding    -  Primary   Breast screening       Elevated BP without diagnosis of hypertension       Relevant Orders   Comprehensive metabolic panel (Completed)   Lipid panel (Completed)    TSH (Completed)   Hemoglobin A1c (Completed)   Encounter for preconception consultation       Relevant Orders   Comprehensive metabolic panel (Completed)   Lipid panel (Completed)   TSH (Completed)   Hemoglobin A1c (Completed)   Class 3 severe obesity without serious comorbidity with body mass index (BMI) of 50.0 to 59.9 in adult, unspecified obesity type ([place])       Relevant Orders   Comprehensive metabolic panel (Completed)   Lipid panel (Completed)   TSH (Completed)   Hemoglobin A1c (Completed)       2) STI screening  was notoffered and therefore not obtained  2)  ASCCP guidelines and rational discussed.  Patient opts for every 3 years screening interval - up to date  3) Contraception - the patient  is currently using  none.  She is attempting to conceive in the near future - had IUD removed last visit, has not resumed normal menstruation yet  4) Routine healthcare maintenance including cholesterol, diabetes screening discussed Ordered today - BP significantly elevated, given interested in conceiving start Labetalol 228m po bid  5) Return in about 8 weeks (around 05/11/2021) for 6-8 week BP check.   AMalachy Mood MD, [facility], [facility] 03/16/2021, 3:21 PM

## 2021-03-17 LAB — COMPREHENSIVE METABOLIC PANEL
ALT: 18 IU/L (ref 0–32)
AST: 18 IU/L (ref 0–40)
Albumin/Globulin Ratio: 1.6 (ref 1.2–2.2)
Albumin: 4.5 g/dL (ref 3.8–4.8)
Alkaline Phosphatase: 62 IU/L (ref 44–121)
BUN/Creatinine Ratio: 19 (ref 9–23)
BUN: 14 mg/dL (ref 6–20)
Bilirubin Total: <0.2 mg/dL (ref 0.0–1.2)
CO2: 21 mmol/L (ref 20–29)
Calcium: 9.6 mg/dL (ref 8.7–10.2)
Chloride: 103 mmol/L (ref 96–106)
Creatinine, Ser: 0.72 mg/dL (ref 0.57–1.00)
Globulin, Total: 2.9 g/dL (ref 1.5–4.5)
Glucose: 90 mg/dL (ref 65–99)
Potassium: 4.7 mmol/L (ref 3.5–5.2)
Sodium: 139 mmol/L (ref 134–144)
Total Protein: 7.4 g/dL (ref 6.0–8.5)
eGFR: 110 mL/min/{1.73_m2} (ref >59)

## 2021-03-17 LAB — LIPID PANEL
Chol/HDL Ratio: 5.5 ratio — ABNORMAL HIGH (ref 0.0–4.4)
Cholesterol, Total: 240 mg/dL — ABNORMAL HIGH (ref 100–199)
HDL: 44 mg/dL (ref >39)
LDL Chol Calc (NIH): 166 mg/dL — ABNORMAL HIGH (ref 0–99)
Triglycerides: 161 mg/dL — ABNORMAL HIGH (ref 0–149)
VLDL Cholesterol Cal: 30 mg/dL (ref 5–40)

## 2021-03-17 LAB — HEMOGLOBIN A1C
Est. average glucose Bld gHb Est-mCnc: 123 mg/dL
Hgb A1c MFr Bld: 5.9 % — ABNORMAL HIGH (ref 4.8–5.6)

## 2021-03-17 LAB — TSH: TSH: 1.04 u[IU]/mL (ref 0.450–4.500)

## 2021-05-11 ENCOUNTER — Other Ambulatory Visit: Payer: Self-pay

## 2021-05-11 ENCOUNTER — Encounter: Payer: Self-pay | Admitting: Obstetrics and Gynecology

## 2021-05-11 ENCOUNTER — Ambulatory Visit (INDEPENDENT_AMBULATORY_CARE_PROVIDER_SITE_OTHER): Payer: Medicaid Other | Admitting: Obstetrics and Gynecology

## 2021-05-11 VITALS — BP 178/95 | Ht 66.0 in | Wt 361.0 lb

## 2021-05-11 DIAGNOSIS — I10 Essential (primary) hypertension: Secondary | ICD-10-CM | POA: Diagnosis not present

## 2021-05-11 MED ORDER — LABETALOL HCL 200 MG PO TABS: 400.0000 mg | ORAL_TABLET | Freq: Two times a day (BID) | ORAL | 3 refills | Status: DC

## 2021-05-11 NOTE — Progress Notes (Signed)
Obstetrics & Gynecology Office Visit   Chief Complaint: No chief complaint on file.   History of Present Illness: 40 y.o. [id number] being seen for follow up blood pressure check today.  The patient is not pregnantThe established diagnosis for the patient is chronic hypertension.  She is currently on labetalol 266m bid.  She reports no current symptoms attributable to her blood pressure.  Medication list reviewed medications which may contribute to BP elevation were noted and no medications contraindicated for use in patient with current hypertension were noted.  Review of Systems: Review of Systems  Constitutional: Negative.   Cardiovascular:  Negative for chest pain.  Gastrointestinal: Negative.   Genitourinary: Negative.   Neurological:  Negative for dizziness and headaches.    Past Medical History:  Past Medical History:  Diagnosis Date   Back pain    Eye inflammation    Miscarriage    Sciatica     Past Surgical History:  Past Surgical History:  Procedure Laterality Date   COLONOSCOPY WITH PROPOFOL N/A 02/08/2021   Procedure: COLONOSCOPY WITH PROPOFOL;  Surgeon: WLucilla Lame MD;  [facility];  Service: Endoscopy;  Laterality: N/A;   HEMORRHOID SURGERY      Gynecologic History: No LMP recorded.  Obstetric History: [id number] Family History:  Family History  Problem Relation Age of Onset   Hypertension Mother    Diabetes Mother    Hypertension Father    Diabetes Father    Asthma Daughter    Breast cancer Paternal AAunt 42      Deceased from metastasis   Cancer Maternal Grandmother    Brain cancer Maternal Grandmother    Brain cancer Paternal Grandmother    Colon cancer Paternal Grandfather     Social History:  Social History   Socioeconomic History   Marital status: Married    Spouse name: ECornelia Copa  Number of children: 2   Years of education: Not on file   Highest education level: Not on file  Occupational History   Not on file  Tobacco  Use   Smoking status: Former    Types: Cigarettes    Quit date: 01/31/2016    Years since quitting: 5.2   Smokeless tobacco: Never  Vaping Use   Vaping Use: Never used  Substance and Sexual Activity   Alcohol use: Yes    Comment: occasionally    Drug use: Never   Sexual activity: Yes    Partners: Male    Birth control/protection: None  Other Topics Concern   Not on file  Social History Narrative   Not on file   Social Determinants of Health   Financial Resource Strain: Not on file  Food Insecurity: Not on file  Transportation Needs: Not on file  Physical Activity: Not on file  Stress: Not on file  Social Connections: Not on file  Intimate Partner Violence: Not on file    Allergies:  Allergies  Allergen Reactions   Peanuts [Peanut Oil] Swelling    Medications: Prior to Admission medications   Medication Sig Start Date End Date Taking? Authorizing Provider  dicyclomine (BENTYL) 20 MG tablet Take 1 tablet (20 mg total) by mouth 3 (three) times daily before meals. 03/08/21   WLucilla Lame MD  Eluxadoline (VIBERZI) 100 MG TABS Take 1 tablet (100 mg total) by mouth 2 (two) times daily. 02/23/21   WLucilla Lame MD  labetalol (NORMODYNE) 200 MG tablet Take 1 tablet (200 mg total) by mouth 2 (two) times daily.  03/16/21   Malachy Mood, MD  Na Sulfate-K Sulfate-Mg Sulf (SUPREP BOWEL PREP KIT) 17.5-3.13-1.6 GM/177ML SOLN Take 1 kit by mouth as directed. 01/17/21   Lucilla Lame, MD    Physical Exam Blood pressure (!) 178/95, height 5' 6"  (1.676 m), weight (!) 361 lb (163.7 kg), last menstrual period 05/04/2021, currently breastfeeding.   No LMP recorded.  General: NAD HEENT: normocephalic, anicteric Pulmonary: No increased work of breathing Extremities: noedema, no erythema, no tenderness Neurologic: Grossly intact Psychiatric: mood appropriate, affect full  Assessment: 40 y.o. [id number] presenting for blood pressure evaluation today  Plan: Problem List Items Addressed  This Visit   None Visit Diagnoses     Essential hypertension    -  Primary   Relevant Medications   labetalol (NORMODYNE) 200 MG tablet       1) Blood pressure - blood pressure at today's visit is elevated.  As a result adjustments were made to the patient's antihypertensive therapy. - Labetalol increased to 410m po bid - additional blood work was not obtained   AMalachy Mood MD, [facility] [facility] [place]9/03/2021, 4:18 PM

## 2021-05-16 ENCOUNTER — Telehealth: Payer: Self-pay | Admitting: Cardiovascular Disease

## 2021-05-16 NOTE — Telephone Encounter (Signed)
Scheduling - Attempted to schedule.  LMOV to call office.    Ashley Herring - patient notes chest pain and palpitations on mychart request unable to reach for additional information.

## 2021-05-16 NOTE — Telephone Encounter (Signed)
Appointment Request (Newest Message First) Ashley Herring  Patient Appointment Schedule Request Pool 13 hours ago (1:55 AM)   appointment Request From: Ashley Herring   With Provider: Lorine Bears, MD [facility] Uncertain]   Preferred Date Range: 05/18/2021 - 05/25/2021   Preferred Times: Tuesday Afternoon, Wednesday Afternoon   Reason for visit: Office Visit   Comments: Heart racing and chest tightness at times.

## 2021-05-17 NOTE — Telephone Encounter (Signed)
Called pt. Ashley Herring.

## 2021-05-31 ENCOUNTER — Encounter: Payer: Self-pay | Admitting: General Surgery

## 2021-05-31 NOTE — Progress Notes (Signed)
Cardiology Office Note:    Date:  06/01/2021   ID:  Ashley, Herring 06/18/1981, MRN [id number]  PCP:  Doren Custard, FNP  [facility] Cardiologist:  None  [facility] Electrophysiologist:  None   Referring MD: Doren Custard, FNP   Chief Complaint: heart racing  History of Present Illness:    Ashley Herring is a 40 y.o. female with a hx of cardiomegaly and palpitations, remote syncope, morbid obesity, preeclampsia during pregnancy, EF 55-60% by echo.    In 2021 she developed preeclampsia and her blood pressure was close to 190 mmHg.  She had mild headache but no other symptoms.  There was mild swelling in lower extremities.  She required multiple antihypertensive medications.  She had a chest x-ray that showed cardiomegaly and BNP was mildly elevated at 195.  She had an echocardiogram done that showed an EF of 55 to 60% with mildly dilated left ventricle and mild LVH, mild mitral regurgitation with no other significant abnormalities.  The patient's blood pressure improved after delivery and she was discharged home on nifedipine.  She did not have these issues with 2 previous pregnancies.  Last seen 01/29/20 and reported palpitations and heart monitor was ordered. Heart monitor showed I short run of SVT, otherwise normal.   Today, the patient reports palpitations. Has had it for a while but feels heart races more when she is exerting herself. This occurs when she walks or does exercise. If she doesn't stop then she feels like she is going to faint. Tries to walk 3-4 times a week. Walks for 30-60 min. Also feels heart racing when she feels excited or stressed. It can improve with rest and deep breaths. Episodes can last 10 minutes. She denies chest pain. Has shortness of breath on exertion. No LLE, orthopnea, pnd. No h/o of anxiety. Eating and drinking normally. Walking to get healthy, weight loss. She is busy with work, Public affairs consultant, and school. For this reason it is hard to keep  consistently eating healthy. Husband does help.The patient quit smoking 2015. No drug. Drinks occasionally. Drinks coffee and tea. Stress seems to trigger episodes. BP elevated, OBGYN started labetalol in July. HR in the 70s.   Past Medical History:  Diagnosis Date   Back pain    Eye inflammation    Miscarriage    Sciatica     Past Surgical History:  Procedure Laterality Date   COLONOSCOPY WITH PROPOFOL N/A 02/08/2021   Procedure: COLONOSCOPY WITH PROPOFOL;  Surgeon: Midge Minium, MD;  Location: [facility] ENDOSCOPY;  Service: Endoscopy;  Laterality: N/A;   HEMORRHOID SURGERY      Current Medications: Current Meds  Medication Sig   dicyclomine (BENTYL) 20 MG tablet Take 1 tablet (20 mg total) by mouth 3 (three) times daily before meals.   Eluxadoline (VIBERZI) 100 MG TABS Take 1 tablet (100 mg total) by mouth 2 (two) times daily.   metoprolol tartrate (LOPRESSOR) 50 MG tablet Take 1 tablet (50 mg total) by mouth 2 (two) times daily.   [DISCONTINUED] labetalol (NORMODYNE) 200 MG tablet Take 2 tablets (400 mg total) by mouth 2 (two) times daily.     Allergies:   Peanuts [peanut oil]   Social History   Socioeconomic History   Marital status: Married    Spouse name: Dennard Nip   Number of children: 2   Years of education: Not on file   Highest education level: Not on file  Occupational History   Not on file  Tobacco Use  Smoking status: Former    Types: Cigarettes    Quit date: 01/31/2016    Years since quitting: 5.3   Smokeless tobacco: Never  Vaping Use   Vaping Use: Never used  Substance and Sexual Activity   Alcohol use: Yes    Comment: occasionally    Drug use: Never   Sexual activity: Yes    Partners: Male    Birth control/protection: None  Other Topics Concern   Not on file  Social History Narrative   Not on file   Social Determinants of Health   Financial Resource Strain: Not on file  Food Insecurity: Not on file  Transportation Needs: Not on file  Physical  Activity: Not on file  Stress: Not on file  Social Connections: Not on file     Family History: The patient's family history includes Asthma in her daughter; Brain cancer in her maternal grandmother and paternal grandmother; Breast cancer (age of onset: 56) in her paternal aunt; Cancer in her maternal grandmother; Colon cancer in her paternal grandfather; Diabetes in her father and mother; Hypertension in her father and mother.  ROS:   Please see the history of present illness.     All other systems reviewed and are negative.  EKGs/Labs/Other Studies Reviewed:    The following studies were reviewed today:  Heart monitor 03/2020 14-day ZIO monitor:   Normal sinus rhythm with an average heart rate of 84 bpm. 1 run of SVT lasting 4 beats with a rate of 116 bpm. Rare PACs and rare PVCs. Triggered events correlated with sinus tachycardia.  Echo 12/2019 1. Left ventricular ejection fraction, by estimation, is 60 to 65%. The  left ventricle has normal function. The left ventricle has no regional  wall motion abnormalities. Left ventricular diastolic parameters are  consistent with Grade I diastolic  dysfunction (impaired relaxation).   2. Right ventricular systolic function is normal. The right ventricular  size is normal.   3. Left atrial size was mildly dilated.   4. The mitral valve is normal in structure. Trivial mitral valve  regurgitation. No evidence of mitral stenosis.   5. The aortic valve is normal in structure. Aortic valve regurgitation is  not visualized. No aortic stenosis is present.   6. The inferior vena cava is normal in size with greater than 50%  respiratory variability, suggesting right atrial pressure of 3 mmHg.   EKG:  EKG is  ordered today.  The ekg ordered today demonstrates NSR, 72bpm TWI III, no changes  Recent Labs: 03/16/2021: ALT 18; BUN 14; Creatinine, Ser 0.72; Potassium 4.7; Sodium 139; TSH 1.040  Recent Lipid Panel    Component Value Date/Time    CHOL 240 (H) 03/16/2021 1633   TRIG 161 (H) 03/16/2021 1633   HDL 44 03/16/2021 1633   CHOLHDL 5.5 (H) 03/16/2021 1633   CHOLHDL 5.4 (H) 01/31/2019 1059   LDLCALC 166 (H) 03/16/2021 1633   LDLCALC 156 (H) 01/31/2019 1059    Physical Exam:    VS:  BP 140/84 (BP Location: Left Arm, Patient Position: Sitting, Cuff Size: Normal)   Pulse 72   Ht 5\' 6"  (1.676 m)   Wt (!) 359 lb (162.8 kg)   LMP 05/04/2021   SpO2 98%   BMI 57.94 kg/m     Wt Readings from Last 3 Encounters:  06/01/21 (!) 359 lb (162.8 kg)  05/11/21 (!) 361 lb (163.7 kg)  03/16/21 (!) 361 lb (163.7 kg)     GEN:  Well nourished, well developed  in no acute distress HEENT: Normal NECK: No JVD; No carotid bruits LYMPHATICS: No lymphadenopathy CARDIAC: RRR, + murmur, no rubs, gallops RESPIRATORY:  Clear to auscultation without rales, wheezing or rhonchi  ABDOMEN: Soft, non-tender, non-distended MUSCULOSKELETAL:  No edema; No deformity  SKIN: Warm and dry NEUROLOGIC:  Alert and oriented x 3 PSYCHIATRIC:  Normal affect   ASSESSMENT:    1. Palpitations   2. Essential hypertension   3. BMI 50.0-59.9, adult (HCC)   4. Murmur    PLAN:    In order of problems listed above:  Palpitations Patient reports palpitations worse with exertion and stress, reports associated SOB but no chest pain. Heart monitor from 03/2020 showed I short run of SVT, overall unremarkable. Triggered events associated with sinus tachycardia. EKG today showed NSR, 72bpm, TWI III, no significant change. I suspect stress and general deconditioning contributing to symptoms. I will order a 2 week heart monitor. I will check BMET, Mag, CBC. THS normal 03/2021. She is not breast feeding.I will stop labetolol and start Lopressor 50mg  BID. Check BP for a week, call in. If able, can increse Lopressor to 100mg  BID. Discussed lifestyle changes with the patient.   HTN BP mildly elevated 140/84. Stop labetolol and start Lopressor, plan as above. Suspect patient  may neen addiontal antihypertensives to bp control. She will check Bps at home, can evaluate at follow-up.   Morbid obesity Patient is walking 3-4 times a week. Diet and lifestyle changes discussed.   Murmur Murmur on exam, will order echo  Disposition: Follow up in 2 week(s) with MD/APP    Signed, Khiree Bukhari , PA-C  06/01/2021 3:34 PM    [facility]

## 2021-06-01 ENCOUNTER — Encounter: Payer: Self-pay | Admitting: Medical

## 2021-06-01 ENCOUNTER — Other Ambulatory Visit: Payer: Self-pay

## 2021-06-01 ENCOUNTER — Ambulatory Visit (INDEPENDENT_AMBULATORY_CARE_PROVIDER_SITE_OTHER): Payer: Medicaid Other

## 2021-06-01 ENCOUNTER — Ambulatory Visit (INDEPENDENT_AMBULATORY_CARE_PROVIDER_SITE_OTHER): Payer: Medicaid Other | Admitting: Medical

## 2021-06-01 VITALS — BP 140/84 | HR 72 | Ht 66.0 in | Wt 359.0 lb

## 2021-06-01 DIAGNOSIS — I10 Essential (primary) hypertension: Secondary | ICD-10-CM

## 2021-06-01 DIAGNOSIS — Z6841 Body Mass Index (BMI) 40.0 and over, adult: Secondary | ICD-10-CM

## 2021-06-01 DIAGNOSIS — R002 Palpitations: Secondary | ICD-10-CM | POA: Diagnosis not present

## 2021-06-01 DIAGNOSIS — R011 Cardiac murmur, unspecified: Secondary | ICD-10-CM | POA: Diagnosis not present

## 2021-06-01 MED ORDER — METOPROLOL TARTRATE 50 MG PO TABS: 50.0000 mg | ORAL_TABLET | Freq: Two times a day (BID) | ORAL | 1 refills | Status: DC

## 2021-06-01 NOTE — Patient Instructions (Signed)
Medication Instructions:  Your physician has recommended you make the following change in your medication:   1) STOP Labetalol  2) START Metoprolol Tartrate 50 mg twice a day. An Rx has been sent to your pharmacy.  *If you need a refill on your cardiac medications before your next appointment, please call your pharmacy*   Lab Work: Bmp, Cbc, Mag today  If you have labs (blood work) drawn today and your tests are completely normal, you will receive your results only by: MyChart Message (if you have MyChart) OR A paper copy in the mail If you have any lab test that is abnormal or we need to change your treatment, we will call you to review the results.   Testing/Procedures: Your physician has requested that you have an echocardiogram. Echocardiography is a painless test that uses sound waves to create images of your heart. It provides your doctor with information about the size and shape of your heart and how well your heart's chambers and valves are working. This procedure takes approximately one hour. There are no restrictions for this procedure.  Your physician has recommended that you wear a Zio monitor XT. (To be worn for 14 days)  This monitor is a medical device that records the heart's electrical activity. Doctors most often use these monitors to diagnose arrhythmias. Arrhythmias are problems with the speed or rhythm of the heartbeat. The monitor is a small device applied to your chest. You can wear one while you do your normal daily activities. While wearing this monitor if you have any symptoms to push the button and record what you felt. Once you have worn this monitor for the period of time provider prescribed (Usually 14 days), you will return the monitor device in the postage paid box. Once it is returned they will download the data collected and provide [place] with a report which the provider will then review and we will call you with those results. Important tips:  Avoid showering  during the first 24 hours of wearing the monitor. Avoid excessive sweating to help maximize wear time. Do not submerge the device, no hot tubs, and no swimming pools. Keep any lotions or oils away from the patch. After 24 hours you may shower with the patch on. Take brief showers with your back facing the shower head.  Do not remove patch once it has been placed because that will interrupt data and decrease adhesive wear time. Push the button when you have any symptoms and write down what you were feeling. Once you have completed wearing your monitor, remove and place into box which has postage paid and place in your outgoing mailbox.  If for some reason you have misplaced your box then call our office and we can provide another box and/or mail it off for you.      Follow-Up: At [facility], you and your health needs are our priority.  As part of our continuing mission to provide you with exceptional heart care, we have created designated Provider Care Teams.  These Care Teams include your primary Cardiologist (physician) and Advanced Practice Providers (APPs -  Physician Assistants and Nurse Practitioners) who all work together to provide you with the care you need, when you need it.  We recommend signing up for the patient portal called "MyChart".  Sign up information is provided on this After Visit Summary.  MyChart is used to connect with patients for Virtual Visits (Telemedicine).  Patients are able to view lab/test results, encounter notes,  upcoming appointments, etc.  Non-urgent messages can be sent to your provider as well.   To learn more about what you can do with MyChart, go to [email or web address].    Your next appointment:   2 month(s)  The format for your next appointment:   In Person  Provider:   You may see Dr. Kirke Corin or one of the following Advanced Practice Providers on your designated Care Team:   Nicolasa Ducking, NP Eula Listen, PA-C Marisue Ivan,  PA-C Cadence Fransico Michael, [place]   Other Instructions Your physician has requested that you monitor and record your blood pressure readings daily  at home. Please use the same machine at the same time of day to check your readings and record. Please call the office or send a mychart message with your readings in 1 week

## 2021-06-02 LAB — BASIC METABOLIC PANEL
BUN/Creatinine Ratio: 16 (ref 9–23)
BUN: 10 mg/dL (ref 6–20)
CO2: 22 mmol/L (ref 20–29)
Calcium: 9.3 mg/dL (ref 8.7–10.2)
Chloride: 103 mmol/L (ref 96–106)
Creatinine, Ser: 0.63 mg/dL (ref 0.57–1.00)
Glucose: 88 mg/dL (ref 70–99)
Potassium: 4.1 mmol/L (ref 3.5–5.2)
Sodium: 140 mmol/L (ref 134–144)
eGFR: 116 mL/min/{1.73_m2} (ref >59)

## 2021-06-02 LAB — CBC WITH DIFFERENTIAL/PLATELET
Basophils Absolute: 0.1 10*3/uL (ref 0.0–0.2)
Basos: 1 %
EOS (ABSOLUTE): 0.3 10*3/uL (ref 0.0–0.4)
Eos: 3 %
Hematocrit: 35.4 % (ref 34.0–46.6)
Hemoglobin: 11.7 g/dL (ref 11.1–15.9)
Immature Grans (Abs): 0 10*3/uL (ref 0.0–0.1)
Immature Granulocytes: 0 %
Lymphocytes Absolute: 2.8 10*3/uL (ref 0.7–3.1)
Lymphs: 29 %
MCH: 27.9 pg (ref 26.6–33.0)
MCHC: 33.1 g/dL (ref 31.5–35.7)
MCV: 85 fL (ref 79–97)
Monocytes Absolute: 0.6 10*3/uL (ref 0.1–0.9)
Monocytes: 6 %
Neutrophils Absolute: 6 10*3/uL (ref 1.4–7.0)
Neutrophils: 61 %
Platelets: 412 10*3/uL (ref 150–450)
RBC: 4.19 x10E6/uL (ref 3.77–5.28)
RDW: 14.5 % (ref 11.7–15.4)
WBC: 9.7 10*3/uL (ref 3.4–10.8)

## 2021-06-02 LAB — MAGNESIUM: Magnesium: 2 mg/dL (ref 1.6–2.3)

## 2021-06-09 ENCOUNTER — Encounter: Payer: Self-pay | Admitting: Obstetrics and Gynecology

## 2021-06-09 ENCOUNTER — Other Ambulatory Visit: Payer: Self-pay

## 2021-06-09 ENCOUNTER — Ambulatory Visit (INDEPENDENT_AMBULATORY_CARE_PROVIDER_SITE_OTHER): Payer: Medicaid Other | Admitting: Obstetrics and Gynecology

## 2021-06-09 VITALS — BP 152/91 | Ht 66.0 in | Wt 359.0 lb

## 2021-06-09 DIAGNOSIS — Z0130 Encounter for examination of blood pressure without abnormal findings: Secondary | ICD-10-CM

## 2021-06-09 NOTE — Progress Notes (Signed)
Obstetrics & Gynecology Office Visit   Chief Complaint:  Chief Complaint  Patient presents with   Follow-up    BP check - RM 3    History of Present Illness: 40 y.o. [id number] being seen for follow up blood pressure check today.  The patient is not pregnantThe established diagnosis for the patient is chronic hypertension.  She is currently on  metoprolol 50mg  po bid, has been seen by cardiology .  She reports no current symptoms attributable to her blood pressure.  Medication list reviewed medications which may contribute to BP elevation were not noted and no medications contraindicated for use in patient with current hypertension were noted.  Review of Systems: Review of Systems  Constitutional: Negative.   Gastrointestinal: Negative.   Genitourinary: Negative.   Neurological:  Negative for headaches.    Past Medical History:  Past Medical History:  Diagnosis Date   Back pain    Eye inflammation    Miscarriage    Sciatica     Past Surgical History:  Past Surgical History:  Procedure Laterality Date   COLONOSCOPY WITH PROPOFOL N/A 02/08/2021   Procedure: COLONOSCOPY WITH PROPOFOL;  Surgeon: 04/10/2021, MD;  Location: [facility] ENDOSCOPY;  Service: Endoscopy;  Laterality: N/A;   HEMORRHOID SURGERY      Gynecologic History: No LMP recorded.  Obst[facility] History: [facility]  Family History:  Family History  Problem Relation Age of Onset   Hypertension Mother    Diabetes Mother    Hypertension Father    Diabetes Father    Asthma Daughter    Breast cancer Paternal Aunt 44       Deceased from metastasis   Cancer Maternal Grandmother    Brain cancer Maternal Grandmother    Brain cancer Paternal Grandmother    Colon cancer Paternal Grandfather     Social History:  Social History   Socioeconomic History   Marital status: Married    Spouse name: 26   Number of children: 2   Years of education: Not on file   Highest education level: Not on file  Occupational  History   Not on file  Tobacco Use   Smoking status: Former    Types: Cigarettes    Quit date: 01/31/2016    Years since quitting: 5.3   Smokeless tobacco: Never  Vaping Use   Vaping Use: Never used  Substance and Sexual Activity   Alcohol use: Yes    Comment: occasionally    Drug use: Never   Sexual activity: Yes    Partners: Male    Birth control/protection: None  Other Topics Concern   Not on file  Social History Narrative   Not on file   Social Determinants of Health   Financial Resource Strain: Not on file  Food Insecurity: Not on file  Transportation Needs: Not on file  Physical Activity: Not on file  Stress: Not on file  Social Connections: Not on file  Intimate Partner Violence: Not on file    Allergies:  Allergies  Allergen Reactions   Peanuts [Peanut Oil] Swelling    Medications: Prior to Admission medications   Medication Sig Start Date End Date Taking? Authorizing Provider  dicyclomine (BENTYL) 20 MG tablet Take 1 tablet (20 mg total) by mouth 3 (three) times daily before meals. 03/08/21   05/09/21, MD  Eluxadoline (VIBERZI) 100 MG TABS Take 1 tablet (100 mg total) by mouth 2 (two) times daily. 02/23/21   02/25/21, MD  metoprolol tartrate (LOPRESSOR)  50 MG tablet Take 1 tablet (50 mg total) by mouth 2 (two) times daily. 06/01/21 08/30/21  Furth, Cadence H, PA-C    Physical Exam Blood pressure (!) 152/91, height 5\' 6"  (1.676 m), weight (!) 359 lb (162.8 kg), currently breastfeeding.  No LMP recorded.  General: NAD HEENT: normocephalic, anicteric Pulmonary: No increased work of breathing Extremities: noedema, no erythema, no tenderness Neurologic: Grossly intact Psychiatric: mood appropriate, affect full  Assessment: 40 y.o. 24 presenting for blood pressure evaluation today  Plan: Problem List Items Addressed This Visit   None Visit Diagnoses     BP check    -  Primary       1) Blood pressure - blood pressure at today's visit is  elevated.  As a result  continue current antihypertensive regimen . - additional blood work was not obtained  2) Return call once cardiology work up completed.    [id number], MD, Vena Austria [facility], [facility] 06/09/2021, 4:29 PM

## 2021-06-16 ENCOUNTER — Ambulatory Visit (INDEPENDENT_AMBULATORY_CARE_PROVIDER_SITE_OTHER): Payer: Medicaid Other

## 2021-06-16 ENCOUNTER — Other Ambulatory Visit: Payer: Self-pay

## 2021-06-16 DIAGNOSIS — R011 Cardiac murmur, unspecified: Secondary | ICD-10-CM

## 2021-06-17 LAB — ECHOCARDIOGRAM COMPLETE
AR max vel: 3.15 cm2
AV Area VTI: 3.07 cm2
AV Area mean vel: 3.11 cm2
AV Mean grad: 6 mmHg
AV Peak grad: 12.4 mmHg
Ao pk vel: 1.76 m/s
Area-P 1/2: 4.6 cm2
Calc EF: 56.4 %
S' Lateral: 2.8 cm
Single Plane A2C EF: 52.7 %
Single Plane A4C EF: 58.4 %

## 2021-06-21 ENCOUNTER — Telehealth: Payer: Self-pay

## 2021-06-21 NOTE — Telephone Encounter (Signed)
Called patient and gave her the result note as documented .  Patient reported a BP of 99/67 on Friday 06/10/21, patient felt really dizzy for 2 hours and had a headache for most of the day. Patient stated that her SBP has still been running between 150-160 most of the time. She informed me that she sent her monitor in on Wednesday 06/18/21.

## 2021-06-21 NOTE — Telephone Encounter (Signed)
-----   Message from Cadence David Stall, PA-C sent at 06/20/2021  9:35 AM EDT ----- Echo showed normal pump function, impaired relaxation, mild mitral valve regurgitation (mild leaky valve). Please ask how bps have been at home. Thx

## 2021-06-21 NOTE — Telephone Encounter (Signed)
-----   Message from Cadence H Furth, PA-C sent at 06/20/2021  9:35 AM EDT ----- Echo showed normal pump function, impaired relaxation, mild mitral valve regurgitation (mild leaky valve). Please ask how bps have been at home. Thx 

## 2021-06-23 NOTE — Telephone Encounter (Signed)
Furth, Cadence H, PA-C  Cv Div [facility] Triage 3 hours ago (12:07 PM)   Noted, overall reassuring Bps are mostly higher.      Patient made aware of CF response. Patient voiced appreciation for the call.

## 2021-06-25 ENCOUNTER — Other Ambulatory Visit: Payer: Self-pay | Admitting: Medical

## 2021-06-29 NOTE — Telephone Encounter (Signed)
Mirena removed/Not replace. Patient desires pregnancy

## 2021-07-07 ENCOUNTER — Ambulatory Visit (INDEPENDENT_AMBULATORY_CARE_PROVIDER_SITE_OTHER): Payer: Medicaid Other | Admitting: Obstetrics and Gynecology

## 2021-07-07 ENCOUNTER — Encounter: Payer: Self-pay | Admitting: Obstetrics and Gynecology

## 2021-07-07 ENCOUNTER — Other Ambulatory Visit: Payer: Self-pay

## 2021-07-07 VITALS — BP 169/77 | Ht 66.0 in | Wt 358.0 lb

## 2021-07-07 DIAGNOSIS — R7303 Prediabetes: Secondary | ICD-10-CM

## 2021-07-07 DIAGNOSIS — I10 Essential (primary) hypertension: Secondary | ICD-10-CM

## 2021-07-07 DIAGNOSIS — Z6841 Body Mass Index (BMI) 40.0 and over, adult: Secondary | ICD-10-CM

## 2021-07-07 MED ORDER — INSULIN PEN NEEDLE 32G X 6 MM MISC: 1.0000 [IU] | 0 refills | Status: DC

## 2021-07-07 MED ORDER — OZEMPIC (0.25 OR 0.5 MG/DOSE) 2 MG/1.5ML ~~LOC~~ SOPN: PEN_INJECTOR | SUBCUTANEOUS | 0 refills | Status: AC

## 2021-07-07 NOTE — Progress Notes (Signed)
Obstetrics & Gynecology Office Visit   Chief Complaint:  Chief Complaint  Patient presents with   Follow-up    BP check - no concerns. RM 3    History of Present Illness: 40 y.o. Ashley Herring presenting for follow up evaluation.  BP remains elevated and patient is established with cardiology .  She remains interested in weight loss option but still has suboptimal BP control precluding stimulant weight loss medication such as phentermine or diethlyproprion.  In addition contrave is also contra-indicated given it possible negative effects on BP.     She has no current symptoms attributable to her BP.     Review of Systems:  Review of Systems  Constitutional: Negative.   Cardiovascular:  Negative for chest pain, palpitations and orthopnea.  Genitourinary: Negative.    Past Medical History:  Past Medical History:  Diagnosis Date   Back pain    Eye inflammation    Miscarriage    Sciatica     Past Surgical History:  Past Surgical History:  Procedure Laterality Date   COLONOSCOPY WITH PROPOFOL N/A 02/08/2021   Procedure: COLONOSCOPY WITH PROPOFOL;  Surgeon: Midge Minium, MD;  [facility] ENDOSCOPY;  Service: Endoscopy;  Laterality: N/A;   HEMORRHOID SURGERY      Gynecologic History: No LMP recorded.  Obstetric History: [id number]  Family History:  Family History  Problem Relation Age of Onset   Hypertension Mother    Diabetes Mother    Hypertension Father    Diabetes Father    Asthma Daughter    Breast cancer Paternal Aunt 67       Deceased from metastasis   Cancer Maternal Grandmother    Brain cancer Maternal Grandmother    Brain cancer Paternal Grandmother    Colon cancer Paternal Grandfather     Social History:  Social History   Socioeconomic History   Marital status: Married    Spouse name: Dennard Nip   Number of children: 2   Years of education: Not on file   Highest education level: Not on file  Occupational History   Not on file  Tobacco Use    Smoking status: Former    Types: Cigarettes    Quit date: 01/31/2016    Years since quitting: 5.4   Smokeless tobacco: Never  Vaping Use   Vaping Use: Never used  Substance and Sexual Activity   Alcohol use: Yes    Comment: occasionally    Drug use: Never   Sexual activity: Yes    Partners: Male    Birth control/protection: None  Other Topics Concern   Not on file  Social History Narrative   Not on file   Social Determinants of Health   Financial Resource Strain: Not on file  Food Insecurity: Not on file  Transportation Needs: Not on file  Physical Activity: Not on file  Stress: Not on file  Social Connections: Not on file  Intimate Partner Violence: Not on file    Allergies:  Allergies  Allergen Reactions   Peanuts [Peanut Oil] Swelling    Medications: Prior to Admission medications   Medication Sig Start Date End Date Taking? Authorizing Provider  dicyclomine (BENTYL) 20 MG tablet Take 1 tablet (20 mg total) by mouth 3 (three) times daily before meals. 03/08/21   Midge Minium, MD  Eluxadoline (VIBERZI) 100 MG TABS Take 1 tablet (100 mg total) by mouth 2 (two) times daily. 02/23/21   Midge Minium, MD  metoprolol tartrate (LOPRESSOR) 50 MG tablet TAKE 1  TABLET BY MOUTH TWICE A DAY 06/27/21   Fransico Michael, Cadence H, PA-C    Physical Exam Vitals:  Vitals:   07/07/21 1611  BP: (!) 169/77   No LMP recorded. Body mass index is 57.78 kg/m.  General: NAD HEENT: normocephalic, anicteric Pulmonary: No increased work of breathing Neurologic: Grossly intact Psychiatric: mood appropriate, affect full   Assessment: 40 y.o. [id number] BP check, medical weight loss follow up  Plan: Problem List Items Addressed This Visit   None Visit Diagnoses     Class 3 severe obesity with serious comorbidity and body mass index (BMI) of 50.0 to 59.9 in adult, unspecified obesity type (HCC)    -  Primary   Relevant Medications   Semaglutide,0.25 or 0.5MG /DOS, (OZEMPIC, 0.25 OR 0.5  MG/DOSE,) 2 MG/1.5ML SOPN   Chronic hypertension       Prediabetes           Hypertension 1) Continue to work with PCP and cardiology to optimze BP control  Obesity 1) 1500 Calorie ADA Diet  2) Patient education given regarding appropriate lifestyle changes for weight loss including: regular physical activity, healthy coping strategies, caloric restriction and healthy eating patterns.  3) Patient will be started on weight loss medication. The risks and benefits and side effects of medication, such as Adipex (Phenteramine) ,  Tenuate (Diethylproprion), Belviq (lorcarsin), Contrave (buproprion/naltrexone), Qsymia (phentermine/topiramate), and Saxenda (liraglutide) is discussed. The pros and cons of suppressing appetite and boosting metabolism is discussed. Risks of tolerence and addiction is discussed for selected agents discussed. Use of medicine will ne short term, such as 3-4 months at a time followed by a period of time off of the medicine to avoid these risks and side effects for Adipex, Qsymia, and Tenuate discussed. Pt to call with any negative side effects and agrees to keep follow up appts.  4) Comorbidity Screening - hypothyroidism screening, diabetes, and hyperlipidemia screening offered  5) Encouraged weekly weight monitorig to track progress and sample 1 week food diary  6) Patient interested in [facility] hower we discussed current drug shortage.  She did have a slightly elevated HgbA1C 4 months ago.  Will see if we can get coverage for ozempic particularly given heart healthy benefits of GLP2 agents  7) 15 minutes face-to-face; counseling/coordination of care > 50 percent of visit  8) Follow up in 4 weeks to assess response    Vena Austria, MD, [facility], [facility] 07/07/2021, 4:16 PM

## 2021-07-20 ENCOUNTER — Other Ambulatory Visit: Payer: Self-pay | Admitting: Medical

## 2021-07-22 NOTE — Telephone Encounter (Signed)
Rachael from CoverMyMeds calling about PA for Ozempic; calling to get ins info and demo info; KEY is [id number]

## 2021-07-22 NOTE — Telephone Encounter (Signed)
Called and gave patient's info, recreated request in covermymeds waiting on decision.

## 2021-08-05 ENCOUNTER — Ambulatory Visit (INDEPENDENT_AMBULATORY_CARE_PROVIDER_SITE_OTHER): Payer: Medicaid Other | Admitting: Medical

## 2021-08-05 ENCOUNTER — Encounter: Payer: Self-pay | Admitting: Medical

## 2021-08-05 ENCOUNTER — Other Ambulatory Visit: Payer: Self-pay

## 2021-08-05 VITALS — BP 182/78 | HR 89 | Ht 66.0 in | Wt 363.0 lb

## 2021-08-05 DIAGNOSIS — Z6841 Body Mass Index (BMI) 40.0 and over, adult: Secondary | ICD-10-CM | POA: Diagnosis not present

## 2021-08-05 DIAGNOSIS — I10 Essential (primary) hypertension: Secondary | ICD-10-CM | POA: Diagnosis not present

## 2021-08-05 DIAGNOSIS — R002 Palpitations: Secondary | ICD-10-CM

## 2021-08-05 DIAGNOSIS — R011 Cardiac murmur, unspecified: Secondary | ICD-10-CM | POA: Diagnosis not present

## 2021-08-05 MED ORDER — AMLODIPINE BESYLATE 5 MG PO TABS: 5.0000 mg | ORAL_TABLET | Freq: Every day | ORAL | 2 refills | Status: DC

## 2021-08-05 MED ORDER — METOPROLOL TARTRATE 100 MG PO TABS: 100.0000 mg | ORAL_TABLET | Freq: Two times a day (BID) | ORAL | 2 refills | Status: DC

## 2021-08-05 NOTE — Patient Instructions (Signed)
Medication Instructions:  Your physician has recommended you make the following change in your medication:   INCREASE Metoprolol to 100 mg twice a day. An Rx has been sent to your pharmacy.  START Amlodipine 5 mg daily. An Rx has been sent to your pharmacy.   *If you need a refill on your cardiac medications before your next appointment, please call your pharmacy*   Lab Work: None ordered  If you have labs (blood work) drawn today and your tests are completely normal, you will receive your results only by: MyChart Message (if you have MyChart) OR A paper copy in the mail If you have any lab test that is abnormal or we need to change your treatment, we will call you to review the results.   Testing/Procedures: None ordered   Follow-Up: At [facility], you and your health needs are our priority.  As part of our continuing mission to provide you with exceptional heart care, we have created designated Provider Care Teams.  These Care Teams include your primary Cardiologist (physician) and Advanced Practice Providers (APPs -  Physician Assistants and Nurse Practitioners) who all work together to provide you with the care you need, when you need it.  We recommend signing up for the patient portal called "MyChart".  Sign up information is provided on this After Visit Summary.  MyChart is used to connect with patients for Virtual Visits (Telemedicine).  Patients are able to view lab/test results, encounter notes, upcoming appointments, etc.  Non-urgent messages can be sent to your provider as well.   To learn more about what you can do with MyChart, go to [email or web address].    Your next appointment:   2 week(s)  The format for your next appointment:   In Person  Provider:   You may see  or one of the following Advanced Practice Providers on your designated Care Team:   Nicolasa Ducking, NP Eula Listen, PA-C Cadence Fransico Michael, PA-C{   Other Instructions N/A

## 2021-08-05 NOTE — Progress Notes (Signed)
Cardiology Office Note:    Date:  08/05/2021   ID:  Ashley Herring 1981-03-23, MRN [id number]  PCP:  Ashley Custard, FNP  [facility] Cardiologist:  [facility] Electrophysiologist:  None   Referring MD: Ashley Custard, FNP   Chief Complaint: 2 month follow-up  History of Present Illness:    Ashley Herring is a 40 y.o. female with a hx of with a hx of cardiomegaly and palpitations, remote syncope, morbid obesity, preeclampsia during pregnancy, EF 55-60% by echo.     In 2021 she developed preeclampsia and her blood pressure was close to 190 mmHg.  She had mild headache but no other symptoms.  There was mild swelling in lower extremities.  She required multiple antihypertensive medications.  She had a chest x-ray that showed cardiomegaly and BNP was mildly elevated at 195.  She had an echocardiogram done that showed an EF of 55 to 60% with mildly dilated left ventricle and mild LVH, mild mitral regurgitation with no other significant abnormalities.  The patient's blood pressure improved after delivery and she was discharged home on nifedipine.  She did not have these issues with 2 previous pregnancies.   Last seen 01/29/20 and reported palpitations and heart monitor was ordered. Heart monitor showed I short run of SVT, otherwise normal.   Last seen 06/01/21 and reported palpitations. 2 week heart monitor was ordered. Labetolol was stopeed and Lopressor was started.   Heart monitor showed NSR with rare PACs and PVCs with no significant arrythmia  Today, heart monitor and echo reviewed. The patient reports she is still having occasional palpitations. Occurs about once a week. Overall palpitations seem better than what it was. They can occur sitting or walking. No chest pain or SOB. No LLE, orthopnea, pnd. Has stress at work. BP very high today however denies current lightheadedness or dizziness. Need to titrate medications.    Past Medical History:  Diagnosis Date    Back pain    Eye inflammation    Miscarriage    Sciatica     Past Surgical History:  Procedure Laterality Date   COLONOSCOPY WITH PROPOFOL N/A 02/08/2021   Procedure: COLONOSCOPY WITH PROPOFOL;  Surgeon: Midge Minium, MD;  Location: [facility] ENDOSCOPY;  Service: Endoscopy;  Laterality: N/A;   HEMORRHOID SURGERY      Current Medications: Current Meds  Medication Sig   amLODipine (NORVASC) 5 MG tablet Take 1 tablet (5 mg total) by mouth daily.   dicyclomine (BENTYL) 20 MG tablet Take 1 tablet (20 mg total) by mouth 3 (three) times daily before meals.   Eluxadoline (VIBERZI) 100 MG TABS Take 1 tablet (100 mg total) by mouth 2 (two) times daily.   [DISCONTINUED] metoprolol tartrate (LOPRESSOR) 50 MG tablet TAKE 1 TABLET BY MOUTH TWICE A DAY     Allergies:   Peanuts [peanut oil]   Social History   Socioeconomic History   Marital status: Married    Spouse name: Dennard Nip   Number of children: 2   Years of education: Not on file   Highest education level: Not on file  Occupational History   Not on file  Tobacco Use   Smoking status: Former    Types: Cigarettes    Quit date: 01/31/2016    Years since quitting: 5.5   Smokeless tobacco: Never  Vaping Use   Vaping Use: Never used  Substance and Sexual Activity   Alcohol use: Yes    Comment: occasionally    Drug use: Never  Sexual activity: Yes    Partners: Male    Birth control/protection: None  Other Topics Concern   Not on file  Social History Narrative   Not on file   Social Determinants of Health   Financial Resource Strain: Not on file  Food Insecurity: Not on file  Transportation Needs: Not on file  Physical Activity: Not on file  Stress: Not on file  Social Connections: Not on file     Family History: The patient's family history includes Asthma in her daughter; Brain cancer in her maternal grandmother and paternal grandmother; Breast cancer (age of onset: 68) in her paternal aunt; Cancer in her maternal  grandmother; Colon cancer in her paternal grandfather; Diabetes in her father and mother; Hypertension in her father and mother.  ROS:   Please see the history of present illness.     All other systems reviewed and are negative.  EKGs/Labs/Other Studies Reviewed:    The following studies were reviewed today:  Heart monitor 06/2021 Patient had a min HR of 52 bpm, max HR of 129 bpm, and avg HR of 78 bpm.  Predominant underlying rhythm was Sinus Rhythm.  Rare PACs and rare PVCs. No significant arrhythmia.  Echo 06/2021  1. Challenging image quality   2. Left ventricular ejection fraction, by estimation, is 60 to 65%. The  left ventricle has normal function. The left ventricle has no regional  wall motion abnormalities. Left ventricular diastolic parameters are  consistent with Grade II diastolic  dysfunction (pseudonormalization).   3. Right ventricular systolic function is normal. The right ventricular  size is normal. There is normal pulmonary artery systolic pressure. The  estimated right ventricular systolic pressure is 34.4 mmHg.   4. The mitral valve was not well visualized. Mild mitral valve  regurgitation. No evidence of mitral stenosis.   EKG:  EKG is not ordered today.    Recent Labs: 03/16/2021: ALT 18; TSH 1.040 06/01/2021: BUN 10; Creatinine, Ser 0.63; Hemoglobin 11.7; Magnesium 2.0; Platelets 412; Potassium 4.1; Sodium 140  Recent Lipid Panel    Component Value Date/Time   CHOL 240 (H) 03/16/2021 1633   TRIG 161 (H) 03/16/2021 1633   HDL 44 03/16/2021 1633   CHOLHDL 5.5 (H) 03/16/2021 1633   CHOLHDL 5.4 (H) 01/31/2019 1059   LDLCALC 166 (H) 03/16/2021 1633   LDLCALC 156 (H) 01/31/2019 1059     Physical Exam:    VS:  BP (!) 182/78   Pulse 89   Ht 5\' 6"  (1.676 m)   Wt (!) 363 lb (164.7 kg)   SpO2 100%   BMI 58.59 kg/m     Wt Readings from Last 3 Encounters:  08/05/21 (!) 363 lb (164.7 kg)  07/07/21 (!) 358 lb (162.4 kg)  06/09/21 (!) 359 lb (162.8  kg)     GEN:  Well nourished, well developed in no acute distress HEENT: Normal NECK: No JVD; No carotid bruits LYMPHATICS: No lymphadenopathy CARDIAC: RRR, no murmurs, rubs, gallops RESPIRATORY:  Clear to auscultation without rales, wheezing or rhonchi  ABDOMEN: Soft, non-tender, non-distended MUSCULOSKELETAL:  No edema; No deformity  SKIN: Warm and dry NEUROLOGIC:  Alert and oriented x 3 PSYCHIATRIC:  Normal affect   ASSESSMENT:    1. Palpitations   2. Essential hypertension   3. BMI 50.0-59.9, adult (HCC)   4. Murmur    PLAN:    In order of problems listed above:  Palpitations Reports improved palpitations, however still occur weekly. Recent heart monitor with NSR and rare  PACs/PVCs. I will increase Lopressor to 100mg  BID.   HTN BP very high today but is currently asymptomatic. Increase Lopressor as above. Will add amlodipine 5mg  daily. Reassess BP in 2 weeks. Suspect she will need further titration of medications.  Morbid obesity Recommend weight loss/exercise.  Murmur Mild MR on recent echo.  Disposition: Follow up in 2 week(s) with MD/APP     Signed, Emaya Preston , PA-C  08/05/2021 4:02 PM    [facility]

## 2021-08-15 ENCOUNTER — Ambulatory Visit (INDEPENDENT_AMBULATORY_CARE_PROVIDER_SITE_OTHER): Payer: Medicaid Other | Admitting: Medical

## 2021-08-15 ENCOUNTER — Encounter: Payer: Self-pay | Admitting: Medical

## 2021-08-15 ENCOUNTER — Other Ambulatory Visit: Payer: Self-pay

## 2021-08-15 VITALS — BP 132/76 | HR 88 | Ht 66.0 in | Wt 361.0 lb

## 2021-08-15 DIAGNOSIS — R011 Cardiac murmur, unspecified: Secondary | ICD-10-CM | POA: Diagnosis not present

## 2021-08-15 DIAGNOSIS — R002 Palpitations: Secondary | ICD-10-CM | POA: Diagnosis not present

## 2021-08-15 DIAGNOSIS — Z6841 Body Mass Index (BMI) 40.0 and over, adult: Secondary | ICD-10-CM

## 2021-08-15 DIAGNOSIS — I10 Essential (primary) hypertension: Secondary | ICD-10-CM

## 2021-08-15 MED ORDER — AMLODIPINE BESYLATE 10 MG PO TABS: 10.0000 mg | ORAL_TABLET | Freq: Every day | ORAL | 2 refills | Status: DC

## 2021-08-15 NOTE — Progress Notes (Signed)
Cardiology Office Note:    Date:  08/15/2021   ID:  Rhiannon, Sassaman 12/14/1980, MRN [id number]  PCP:  Doren Custard, FNP  [facility] Cardiologist:  [facility] Electrophysiologist:  None   Referring MD: Doren Custard, FNP   Chief Complaint: Follow-up  History of Present Illness:    Ashley Herring is a 40 y.o. female with a hx of cardiomegaly and palpitations, remote syncope, morbid obesity, preeclampsia during pregnancy, EF 55-60% by echo.     In 2021 she developed preeclampsia and her blood pressure was close to 190 mmHg.  She had mild headache but no other symptoms.  There was mild swelling in lower extremities.  She required multiple antihypertensive medications.  She had a chest x-ray that showed cardiomegaly and BNP was mildly elevated at 195.  She had an echocardiogram done that showed an EF of 55 to 60% with mildly dilated left ventricle and mild LVH, mild mitral regurgitation with no other significant abnormalities.  The patient's blood pressure improved after delivery and she was discharged home on nifedipine.  She did not have these issues with 2 previous pregnancies.   Seen 01/29/20 and reported palpitations and heart monitor was ordered. Heart monitor showed I short run of SVT, otherwise normal.    Seen 06/01/21 and reported palpitations. 2 week heart monitor was ordered. Labetolol was stopped and Lopressor was started.    Heart monitor showed NSR with rare PACs and PVCs with no significant arrhythmia  Last seen 08/05/21 and she reported improved palpitations. Lopressor was increased to 100mg  BID.   Today, the patient reported she is doing OK. BP was a little high. Normally BP 140-150/70-80s. BP today good . Can go up on the amlodipine to 10mg . Work is still pretty stressful, she is trying to decrease this, but this is hard. She is also trying to lose weight, making diet changes and increasing activity. Lifestyle changes discussed in detail during  visit. She denies chest pain, SOB, LLE, orthopnea, or pnd. No further palpitations.   Past Medical History:  Diagnosis Date   Back pain    Eye inflammation    Miscarriage    Sciatica     Past Surgical History:  Procedure Laterality Date   COLONOSCOPY WITH PROPOFOL N/A 02/08/2021   Procedure: COLONOSCOPY WITH PROPOFOL;  Surgeon: , MD;  Location: [facility] ENDOSCOPY;  Service: Endoscopy;  Laterality: N/A;   HEMORRHOID SURGERY      Current Medications: Current Meds  Medication Sig   amLODipine (NORVASC) 5 MG tablet Take 1 tablet (5 mg total) by mouth daily.   Eluxadoline (VIBERZI) 100 MG TABS Take 1 tablet (100 mg total) by mouth 2 (two) times daily.   metoprolol tartrate (LOPRESSOR) 100 MG tablet Take 1 tablet (100 mg total) by mouth 2 (two) times daily.     Allergies:   Peanuts [peanut oil]   Social History   Socioeconomic History   Marital status: Married    Spouse name: Midge Minium   Number of children: 2   Years of education: Not on file   Highest education level: Not on file  Occupational History   Not on file  Tobacco Use   Smoking status: Former    Types: Cigarettes    Quit date: 01/31/2016    Years since quitting: 5.5   Smokeless tobacco: Never  Vaping Use   Vaping Use: Never used  Substance and Sexual Activity   Alcohol use: Yes    Comment: occasionally  Drug use: Never   Sexual activity: Yes    Partners: Male    Birth control/protection: None  Other Topics Concern   Not on file  Social History Narrative   Not on file   Social Determinants of Health   Financial Resource Strain: Not on file  Food Insecurity: Not on file  Transportation Needs: Not on file  Physical Activity: Not on file  Stress: Not on file  Social Connections: Not on file     Family History: The patient's family history includes Asthma in her daughter; Brain cancer in her maternal grandmother and paternal grandmother; Breast cancer (age of onset: 18) in her paternal aunt;  Cancer in her maternal grandmother; Colon cancer in her paternal grandfather; Diabetes in her father and mother; Hypertension in her father and mother.  ROS:   Please see the history of present illness.     All other systems reviewed and are negative.  EKGs/Labs/Other Studies Reviewed:    The following studies were reviewed today:    Heart monitor 06/2021 Patient had a min HR of 52 bpm, max HR of 129 bpm, and avg HR of 78 bpm.  Predominant underlying rhythm was Sinus Rhythm.  Rare PACs and rare PVCs. No significant arrhythmia.   Echo 06/2021  1. Challenging image quality   2. Left ventricular ejection fraction, by estimation, is 60 to 65%. The  left ventricle has normal function. The left ventricle has no regional  wall motion abnormalities. Left ventricular diastolic parameters are  consistent with Grade II diastolic  dysfunction (pseudonormalization).   3. Right ventricular systolic function is normal. The right ventricular  size is normal. There is normal pulmonary artery systolic pressure. The  estimated right ventricular systolic pressure is 34.4 mmHg.   4. The mitral valve was not well visualized. Mild mitral valve  regurgitation. No evidence of mitral stenosis.     EKG:  EKG is not ordered today.    Recent Labs: 03/16/2021: ALT 18; TSH 1.040 06/01/2021: BUN 10; Creatinine, Ser 0.63; Hemoglobin 11.7; Magnesium 2.0; Platelets 412; Potassium 4.1; Sodium 140  Recent Lipid Panel    Component Value Date/Time   CHOL 240 (H) 03/16/2021 1633   TRIG 161 (H) 03/16/2021 1633   HDL 44 03/16/2021 1633   CHOLHDL 5.5 (H) 03/16/2021 1633   CHOLHDL 5.4 (H) 01/31/2019 1059   LDLCALC 166 (H) 03/16/2021 1633   LDLCALC 156 (H) 01/31/2019 1059    Physical Exam:    VS:  BP 132/76 (BP Location: Left Arm, Patient Position: Sitting, Cuff Size: Large)   Pulse 88   Ht 5\' 6"  (1.676 m)   Wt (!) 361 lb (163.7 kg)   SpO2 97%   BMI 58.27 kg/m     Wt Readings from Last 3 Encounters:   08/15/21 (!) 361 lb (163.7 kg)  08/05/21 (!) 363 lb (164.7 kg)  07/07/21 (!) 358 lb (162.4 kg)     GEN:  Well nourished, well developed in no acute distress HEENT: Normal NECK: No JVD; No carotid bruits LYMPHATICS: No lymphadenopathy CARDIAC: RRR, no murmurs, rubs, gallops RESPIRATORY:  Clear to auscultation without rales, wheezing or rhonchi  ABDOMEN: Soft, non-tender, non-distended MUSCULOSKELETAL:  No edema; No deformity  SKIN: Warm and dry NEUROLOGIC:  Alert and oriented x 3 PSYCHIATRIC:  Normal affect   ASSESSMENT:    1. Palpitations   2. Essential hypertension   3. Murmur   4. Class 3 severe obesity due to excess calories with body mass index (BMI) of 50.0  to 59.9 in adult, unspecified whether serious comorbidity present [facility])    PLAN:    In order of problems listed above:  Palpitations No further palpitations. Continue Toprol  HTN Bps still mildly elevated at home 140-150s/70-80s. BP today good.  I will increase amlodipine 10mg  daily. Continue Lopressor 100mg  BID. We will reassess blood pressures in 1-2 months.   Morbid obesity Patient wanting to lose weight, trying to get Ozempic Rx. Recommend she see PCP. Lifestyle changes encouraged.   Murmur Echo showed preserved EF, mild MR  Disposition: Follow up  in 1-2 months  with MD/APP    Signed, Jakeel Starliper , PA-C  08/15/2021 10:18 AM    [facility]

## 2021-08-15 NOTE — Patient Instructions (Addendum)
Medication Instructions:  Your physician has recommended you make the following change in your medication:   INCREASE Amlodipine to 10 mg daily. An Rx has been sent your pharmacy.   *If you need a refill on your cardiac medications before your next appointment, please call your pharmacy*   Lab Work: None ordered  If you have labs (blood work) drawn today and your tests are completely normal, you will receive your results only by: MyChart Message (if you have MyChart) OR A paper copy in the mail If you have any lab test that is abnormal or we need to change your treatment, we will call you to review the results.   Testing/Procedures: None ordered   Follow-Up: At [facility], you and your health needs are our priority.  As part of our continuing mission to provide you with exceptional heart care, we have created designated Provider Care Teams.  These Care Teams include your primary Cardiologist (physician) and Advanced Practice Providers (APPs -  Physician Assistants and Nurse Practitioners) who all work together to provide you with the care you need, when you need it.  We recommend signing up for the patient portal called "MyChart".  Sign up information is provided on this After Visit Summary.  MyChart is used to connect with patients for Virtual Visits (Telemedicine).  Patients are able to view lab/test results, encounter notes, upcoming appointments, etc.  Non-urgent messages can be sent to your provider as well.   To learn more about what you can do with MyChart, go to [email or web address].    Your next appointment:   1-2 month(s)  The format for your next appointment:   In Person  Provider:   You may see Lorine Bears, MD or one of the following Advanced Practice Providers on your designated Care Team:   Nicolasa Ducking, NP Eula Listen, PA-C Cadence Fransico Michael, PA-C:1}    Other Instructions N/A

## 2021-09-05 ENCOUNTER — Other Ambulatory Visit: Payer: Self-pay | Admitting: Obstetrics and Gynecology

## 2021-09-05 DIAGNOSIS — I10 Essential (primary) hypertension: Secondary | ICD-10-CM

## 2021-09-05 DIAGNOSIS — Z6841 Body Mass Index (BMI) 40.0 and over, adult: Secondary | ICD-10-CM

## 2021-09-07 ENCOUNTER — Ambulatory Visit (INDEPENDENT_AMBULATORY_CARE_PROVIDER_SITE_OTHER): Payer: Medicaid Other | Admitting: Obstetrics and Gynecology

## 2021-09-07 ENCOUNTER — Other Ambulatory Visit: Payer: Self-pay

## 2021-09-07 ENCOUNTER — Encounter: Payer: Self-pay | Admitting: Obstetrics and Gynecology

## 2021-09-07 VITALS — BP 146/67 | Ht 66.0 in | Wt 365.0 lb

## 2021-09-07 DIAGNOSIS — I10 Essential (primary) hypertension: Secondary | ICD-10-CM

## 2021-09-07 DIAGNOSIS — Z6841 Body Mass Index (BMI) 40.0 and over, adult: Secondary | ICD-10-CM

## 2021-09-07 NOTE — Progress Notes (Signed)
Obstetrics & Gynecology Office Visit   Chief Complaint: No chief complaint on file.   History of Present Illness: 41 y.o. [id number] being seen for follow up blood pressure check today.  The patient is not pregnantThe established diagnosis for the patient is chronic hypertension.  She is currently on  Norvasc 10mg  .  She reports no current symptoms attributable to her blood pressure.  Medication list reviewed medications which may contribute to BP elevation were not noted.  Review of Systems: Review of Systems  Constitutional: Negative.   Gastrointestinal: Negative.   Genitourinary: Negative.   Neurological:  Negative for headaches.    Past Medical History:  Past Medical History:  Diagnosis Date   Back pain    Eye inflammation    Miscarriage    Sciatica     Past Surgical History:  Past Surgical History:  Procedure Laterality Date   COLONOSCOPY WITH PROPOFOL N/A 02/08/2021   Procedure: COLONOSCOPY WITH PROPOFOL;  Surgeon: Lucilla Lame, MD;  Location: [facility] ENDOSCOPY;  Service: Endoscopy;  Laterality: N/A;   HEMORRHOID SURGERY      Gynecologic History: No LMP recorded.  Obstetric History: [id number]  Family History:  Family History  Problem Relation Age of Onset   Hypertension Mother    Diabetes Mother    Hypertension Father    Diabetes Father    Asthma Daughter    Breast cancer Paternal Aunt 80       Deceased from metastasis   Cancer Maternal Grandmother    Brain cancer Maternal Grandmother    Brain cancer Paternal Grandmother    Colon cancer Paternal Grandfather     Social History:  Social History   Socioeconomic History   Marital status: Married    Spouse name: Cornelia Copa   Number of children: 2   Years of education: Not on file   Highest education level: Not on file  Occupational History   Not on file  Tobacco Use   Smoking status: Former    Types: Cigarettes    Quit date: 01/31/2016    Years since quitting: 5.6   Smokeless tobacco: Never  Vaping Use    Vaping Use: Never used  Substance and Sexual Activity   Alcohol use: Yes    Comment: occasionally    Drug use: Never   Sexual activity: Yes    Partners: Male    Birth control/protection: None  Other Topics Concern   Not on file  Social History Narrative   Not on file   Social Determinants of Health   Financial Resource Strain: Not on file  Food Insecurity: Not on file  Transportation Needs: Not on file  Physical Activity: Not on file  Stress: Not on file  Social Connections: Not on file  Intimate Partner Violence: Not on file    Allergies:  Allergies  Allergen Reactions   Peanuts [Peanut Oil] Swelling    Medications: Prior to Admission medications   Medication Sig Start Date End Date Taking? Authorizing Provider  amLODipine (NORVASC) 10 MG tablet Take 1 tablet (10 mg total) by mouth daily. 08/15/21 11/13/21  Furth, Cadence H, PA-C  dicyclomine (BENTYL) 20 MG tablet Take 1 tablet (20 mg total) by mouth 3 (three) times daily before meals. Patient not taking: Reported on 08/15/2021 03/08/21   Lucilla Lame, MD  Eluxadoline (VIBERZI) 100 MG TABS Take 1 tablet (100 mg total) by mouth 2 (two) times daily. 02/23/21   Lucilla Lame, MD  Insulin Pen Needle 32G X 6 MM MISC 1  Units by Does not apply route once a week. Patient not taking: Reported on 08/05/2021 07/07/21   Malachy Mood, MD  labetalol (NORMODYNE) 200 MG tablet Take 400 mg by mouth 2 (two) times daily. Patient not taking: Reported on 08/15/2021 08/06/21   [provider]  metoprolol tartrate (LOPRESSOR) 100 MG tablet Take 1 tablet (100 mg total) by mouth 2 (two) times daily. 08/05/21   Furth, Cadence H, PA-C    Physical Exam Blood pressure (!) 146/67, height 5\' 6"  (1.676 m), weight (!) 365 lb (165.6 kg), currently breastfeeding. Body mass index is 58.91 kg/m.   No LMP recorded.  General: NAD HEENT: normocephalic, anicteric Pulmonary: No increased work of breathing Neurologic: Grossly intact Psychiatric:  mood appropriate, affect full  Assessment: 41 y.o. [id number] presenting for blood pressure evaluation today  Plan: Problem List Items Addressed This Visit   None Visit Diagnoses     Class 3 severe obesity without serious comorbidity with body mass index (BMI) of 50.0 to 59.9 in adult, unspecified obesity type ([place])    -  Primary   Essential hypertension           1) Blood pressure - blood pressure at today's visit is  improved but remains slightly elevated .  As a result  continue norvasc 10mg  . - additional blood work was not obtained  2) Obesity -given HTN not a candidate for stimulant weight loss options such as phentermine.  She was not approved for ozempic although her HgbA1C was 5.9. - referral sent to [place] [facility] to explore options for bariatric surgery given co-morbidities   Malachy Mood, MD, [place], [facility] 09/07/2021, 4:10 PM

## 2021-09-15 ENCOUNTER — Encounter: Payer: Self-pay | Admitting: Obstetrics and Gynecology

## 2021-09-28 ENCOUNTER — Ambulatory Visit (INDEPENDENT_AMBULATORY_CARE_PROVIDER_SITE_OTHER): Payer: Medicaid Other | Admitting: Medical

## 2021-09-28 ENCOUNTER — Encounter: Payer: Self-pay | Admitting: Medical

## 2021-09-28 ENCOUNTER — Other Ambulatory Visit: Payer: Self-pay

## 2021-09-28 VITALS — BP 130/80 | HR 96 | Ht 66.0 in | Wt 364.0 lb

## 2021-09-28 DIAGNOSIS — R002 Palpitations: Secondary | ICD-10-CM | POA: Diagnosis not present

## 2021-09-28 DIAGNOSIS — Z6841 Body Mass Index (BMI) 40.0 and over, adult: Secondary | ICD-10-CM

## 2021-09-28 DIAGNOSIS — I10 Essential (primary) hypertension: Secondary | ICD-10-CM | POA: Diagnosis not present

## 2021-09-28 DIAGNOSIS — R011 Cardiac murmur, unspecified: Secondary | ICD-10-CM

## 2021-09-28 NOTE — Progress Notes (Signed)
Cardiology Office Note:    Date:  09/28/2021   ID:  Ashley Herring, DOB 02/15/1981, MRN [id number]  PCP:  Doren CustardBoyce, Emily E, FNP  [facility] Cardiologist:  Dr. Cathey EndowArida [facility] Electrophysiologist:  None   Referring MD: Doren CustardBoyce, Emily E, FNP   Chief Complaint: 1-2 month f/u  History of Present Illness:    Ashley Herring is a 41 y.o. female with a hx of  cardiomegaly and palpitations, remote syncope, morbid obesity, preeclampsia during pregnancy, EF 55-60% by echo who presents for 1-2 month.     In 2021 she developed preeclampsia and her blood pressure was close to 190 mmHg.  She had mild headache but no other symptoms.  There was mild swelling in lower extremities.  She required multiple antihypertensive medications.  She had a chest x-ray that showed cardiomegaly and BNP was mildly elevated at 195.  She had an echocardiogram done that showed an EF of 55 to 60% with mildly dilated left ventricle and mild LVH, mild mitral regurgitation with no other significant abnormalities.  The patient's blood pressure improved after delivery and she was discharged home on nifedipine.  She did not have these issues with 2 previous pregnancies.   Seen 01/29/20 and reported palpitations and heart monitor was ordered. Heart monitor showed I short run of SVT, otherwise normal.    Seen 06/01/21 and reported palpitations. 2 week heart monitor was ordered. Labetolol was stopped and Lopressor was started.    Heart monitor showed NSR with rare PACs and PVCs with no significant arrhythmia   Seen 08/05/21 and she reported improved palpitations. Lopressor was increased to 100mg  BID.   Last seen 08/15/21 and was doing OK, BP a little high. Amlodipine was increased to 10mg  daily.   Today, the patient is overall doing OK. She is starting school soon. BP is good today. At home BP is mildly elevated at tomes. No chest pain, shortness of breath, pnd, orthopnea, LLE. She joined a gym. She likes the thirty minute  room. They are trying to go twice a week.   Past Medical History:  Diagnosis Date   Back pain    Eye inflammation    Miscarriage    Sciatica     Past Surgical History:  Procedure Laterality Date   COLONOSCOPY WITH PROPOFOL N/A 02/08/2021   Procedure: COLONOSCOPY WITH PROPOFOL;  Surgeon: Midge MiniumWohl, Darren, MD;  [facility];  Service: Endoscopy;  Laterality: N/A;   HEMORRHOID SURGERY      Current Medications: Current Meds  Medication Sig   amLODipine (NORVASC) 10 MG tablet Take 1 tablet (10 mg total) by mouth daily.   Eluxadoline (VIBERZI) 100 MG TABS Take 1 tablet (100 mg total) by mouth 2 (two) times daily.   metoprolol tartrate (LOPRESSOR) 100 MG tablet Take 1 tablet (100 mg total) by mouth 2 (two) times daily.     Allergies:   Peanuts [peanut oil]   Social History   Socioeconomic History   Marital status: Married    Spouse name: Dennard Nipugene   Number of children: 2   Years of education: Not on file   Highest education level: Not on file  Occupational History   Not on file  Tobacco Use   Smoking status: Former    Types: Cigarettes    Quit date: 01/31/2016    Years since quitting: 5.6   Smokeless tobacco: Never   Tobacco comments:    Smoked one cigarette occassionally  Vaping Use   Vaping Use: Never used  Substance and  Sexual Activity   Alcohol use: Yes    Comment: occasionally    Drug use: Never   Sexual activity: Yes    Partners: Male    Birth control/protection: None  Other Topics Concern   Not on file  Social History Narrative   Not on file   Social Determinants of Health   Financial Resource Strain: Not on file  Food Insecurity: Not on file  Transportation Needs: Not on file  Physical Activity: Not on file  Stress: Not on file  Social Connections: Not on file     Family History: The patient's family history includes Asthma in her daughter; Brain cancer in her maternal grandmother and paternal grandmother; Breast cancer (age of onset: 3) in  her paternal aunt; Cancer in her maternal grandmother; Colon cancer in her paternal grandfather; Diabetes in her father and mother; Hypertension in her father and mother.  ROS:   Please see the history of present illness.    All other systems reviewed and are negative.  EKGs/Labs/Other Studies Reviewed:    The following studies were reviewed today: Heart monitor 06/2021 Patient had a min HR of 52 bpm, max HR of 129 bpm, and avg HR of 78 bpm.  Predominant underlying rhythm was Sinus Rhythm.  Rare PACs and rare PVCs. No significant arrhythmia.   Echo 06/2021  1. Challenging image quality   2. Left ventricular ejection fraction, by estimation, is 60 to 65%. The  left ventricle has normal function. The left ventricle has no regional  wall motion abnormalities. Left ventricular diastolic parameters are  consistent with Grade II diastolic  dysfunction (pseudonormalization).   3. Right ventricular systolic function is normal. The right ventricular  size is normal. There is normal pulmonary artery systolic pressure. The  estimated right ventricular systolic pressure is 34.4 mmHg.   4. The mitral valve was not well visualized. Mild mitral valve  regurgitation. No evidence of mitral stenosis.    EKG:  EKG is not ordered today.    Recent Labs: 03/16/2021: ALT 18; TSH 1.040 06/01/2021: BUN 10; Creatinine, Ser 0.63; Hemoglobin 11.7; Magnesium 2.0; Platelets 412; Potassium 4.1; Sodium 140  Recent Lipid Panel    Component Value Date/Time   CHOL 240 (H) 03/16/2021 1633   TRIG 161 (H) 03/16/2021 1633   HDL 44 03/16/2021 1633   CHOLHDL 5.5 (H) 03/16/2021 1633   CHOLHDL 5.4 (H) 01/31/2019 1059   LDLCALC 166 (H) 03/16/2021 1633   LDLCALC 156 (H) 01/31/2019 1059    Physical Exam:    VS:  BP 130/80 (BP Location: Left Arm, Patient Position: Sitting, Cuff Size: Large)    Pulse 96    Ht 5\' 6"  (1.676 m)    Wt (!) 364 lb (165.1 kg)    SpO2 99%    BMI 58.75 kg/m     Wt Readings from Last 3  Encounters:  09/28/21 (!) 364 lb (165.1 kg)  09/07/21 (!) 365 lb (165.6 kg)  08/15/21 (!) 361 lb (163.7 kg)     GEN:  Well nourished, well developed in no acute distress HEENT: Normal NECK: No JVD; No carotid bruits LYMPHATICS: No lymphadenopathy CARDIAC: RRR, no murmurs, rubs, gallops RESPIRATORY:  Clear to auscultation without rales, wheezing or rhonchi  ABDOMEN: Soft, non-tender, non-distended MUSCULOSKELETAL:  No edema; No deformity  SKIN: Warm and dry NEUROLOGIC:  Alert and oriented x 3 PSYCHIATRIC:  Normal affect   ASSESSMENT:    1. Essential hypertension   2. Palpitations   3. BMI 50.0-59.9, adult (HCC)  4. Murmur    PLAN:    In order of problems listed above:  HTN BP better today. Continue Lopressor and amlodipine 10mg  daily. Lifestyle changes encouraged. She is goin to the gym now.   Palpitations No further palpitations. Continue Lopressor 100mg  twice daily.   Murmur Echo LVEF 60-65%, G2DD, no WMA, mild MR.  Obesity  She is going to the gym now. Diet changes encouraged.   Disposition: Follow up in 6 month(s) with MD/APP     Signed, Eiden Bagot , PA-C  09/28/2021 3:16 PM    [facility]

## 2021-09-28 NOTE — Patient Instructions (Signed)
Medication Instructions:  Your physician recommends that you continue on your current medications as directed. Please refer to the Current Medication list given to you today.  *If you need a refill on your cardiac medications before your next appointment, please call your pharmacy*   Lab Work: None ordered  If you have labs (blood work) drawn today and your tests are completely normal, you will receive your results only by: MyChart Message (if you have MyChart) OR A paper copy in the mail If you have any lab test that is abnormal or we need to change your treatment, we will call you to review the results.   Testing/Procedures: None ordered   Follow-Up: At [facility], you and your health needs are our priority.  As part of our continuing mission to provide you with exceptional heart care, we have created designated Provider Care Teams.  These Care Teams include your primary Cardiologist (physician) and Advanced Practice Providers (APPs -  Physician Assistants and Nurse Practitioners) who all work together to provide you with the care you need, when you need it.  We recommend signing up for the patient portal called "MyChart".  Sign up information is provided on this After Visit Summary.  MyChart is used to connect with patients for Virtual Visits (Telemedicine).  Patients are able to view lab/test results, encounter notes, upcoming appointments, etc.  Non-urgent messages can be sent to your provider as well.   To learn more about what you can do with MyChart, go to [email or web address].    Your next appointment:   Your physician wants you to follow-up in: 6 months You will receive a reminder letter in the mail two months in advance. If you don't receive a letter, please call our office to schedule the follow-up appointment.   The format for your next appointment:   In Person  Provider:   You may see Muhammad Arida, MD or one of the following Advanced Practice Providers on your  designated Care Team:   Christopher Berge, NP Ryan Dunn, PA-C Cadence Furth, PA-C 1}    Other Instructions N/A  

## 2021-10-02 ENCOUNTER — Other Ambulatory Visit: Payer: Self-pay | Admitting: Obstetrics and Gynecology

## 2022-02-10 ENCOUNTER — Telehealth: Payer: Medicaid Other | Admitting: Physician Assistant

## 2022-02-10 ENCOUNTER — Ambulatory Visit: Payer: Self-pay | Admitting: *Deleted

## 2022-02-10 DIAGNOSIS — M5442 Lumbago with sciatica, left side: Secondary | ICD-10-CM

## 2022-02-10 MED ORDER — CYCLOBENZAPRINE HCL 10 MG PO TABS: 5.0000 mg | ORAL_TABLET | Freq: Three times a day (TID) | ORAL | 0 refills | Status: DC | PRN

## 2022-02-10 MED ORDER — METHYLPREDNISOLONE 4 MG PO TBPK: ORAL_TABLET | ORAL | 0 refills | Status: DC

## 2022-02-10 NOTE — Patient Instructions (Signed)
Ashley Herring, thank you for joining Margaretann Loveless, PA-C for today's virtual visit.  While this provider is not your primary care provider (PCP), if your PCP is located in our provider database this encounter information will be shared with them immediately following your visit.  Consent: (Patient) Ashley Herring provided verbal consent for this virtual visit at the beginning of the encounter.  Current Medications:  Current Outpatient Medications:    cyclobenzaprine (FLEXERIL) 10 MG tablet, Take 0.5-1 tablets (5-10 mg total) by mouth 3 (three) times daily as needed for muscle spasms., Disp: 30 tablet, Rfl: 0   methylPREDNISolone (MEDROL DOSEPAK) 4 MG TBPK tablet, 6 day taper; take as directed on package instructions, Disp: 21 tablet, Rfl: 0   amLODipine (NORVASC) 10 MG tablet, Take 1 tablet (10 mg total) by mouth daily., Disp: 30 tablet, Rfl: 2   Eluxadoline (VIBERZI) 100 MG TABS, Take 1 tablet (100 mg total) by mouth 2 (two) times daily., Disp: 60 tablet, Rfl: 5   metoprolol tartrate (LOPRESSOR) 100 MG tablet, Take 1 tablet (100 mg total) by mouth 2 (two) times daily., Disp: 60 tablet, Rfl: 2   Medications ordered in this encounter:  Meds ordered this encounter  Medications   cyclobenzaprine (FLEXERIL) 10 MG tablet    Sig: Take 0.5-1 tablets (5-10 mg total) by mouth 3 (three) times daily as needed for muscle spasms.    Dispense:  30 tablet    Refill:  0    Order Specific Question:   Supervising Provider    Answer:   MILLER, BRIAN [[id number]]   methylPREDNISolone (MEDROL DOSEPAK) 4 MG TBPK tablet    Sig: 6 day taper; take as directed on package instructions    Dispense:  21 tablet    Refill:  0    Order Specific Question:   Supervising Provider    Answer:   Hyacinth Meeker, BRIAN [[id number]]     *If you need refills on other medications prior to your next appointment, please contact your pharmacy*  Follow-Up: Call back or seek an in-person evaluation if the symptoms worsen or if the  condition fails to improve as anticipated.  Other Instructions Cervical Strain and Sprain Rehab Ask your health care provider which exercises are safe for you. Do exercises exactly as told by your health care provider and adjust them as directed. It is normal to feel mild stretching, pulling, tightness, or discomfort as you do these exercises. Stop right away if you feel sudden pain or your pain gets worse. Do not begin these exercises until told by your health care provider. Stretching and range-of-motion exercises Cervical side bending  Using good posture, sit on a stable chair or stand up. Without moving your shoulders, slowly tilt your left / right ear to your shoulder until you feel a stretch in the opposite side neck muscles. You should be looking straight ahead. Hold for __________ seconds. Repeat with the other side of your neck. Repeat __________ times. Complete this exercise __________ times a day. Cervical rotation  Using good posture, sit on a stable chair or stand up. Slowly turn your head to the side as if you are looking over your left / right shoulder. Keep your eyes level with the ground. Stop when you feel a stretch along the side and the back of your neck. Hold for __________ seconds. Repeat this by turning to your other side. Repeat __________ times. Complete this exercise __________ times a day. Thoracic extension and pectoral stretch  Roll a towel or a small  blanket so it is about 4 inches (10 cm) in diameter. Lie down on your back on a firm surface. Put the towel in the middle of your back across your spine. It should not be under your shoulder blades. Put your hands behind your head and let your elbows fall out to your sides. Hold for __________ seconds. Repeat __________ times. Complete this exercise __________ times a day. Strengthening exercises Isometric upper cervical flexion  Lie on your back with a thin pillow behind your head and a small rolled-up  towel under your neck. Gently tuck your chin toward your chest and nod your head down to look toward your feet. Do not lift your head off the pillow. Hold for __________ seconds. Release the tension slowly. Relax your neck muscles completely before you repeat this exercise. Repeat __________ times. Complete this exercise __________ times a day. Isometric cervical extension  Stand about 6 inches (15 cm) away from a wall, with your back facing the wall. Place a soft object, about 6-8 inches (15-20 cm) in diameter, between the back of your head and the wall. A soft object could be a small pillow, a ball, or a folded towel. Gently tilt your head back and press into the soft object. Keep your jaw and forehead relaxed. Hold for __________ seconds. Release the tension slowly. Relax your neck muscles completely before you repeat this exercise. Repeat __________ times. Complete this exercise __________ times a day. Posture and body mechanics Body mechanics refers to the movements and positions of your body while you do your daily activities. Posture is part of body mechanics. Good posture and healthy body mechanics can help to relieve stress in your body's tissues and joints. Good posture means that your spine is in its natural S-curve position (your spine is neutral), your shoulders are pulled back slightly, and your head is not tipped forward. The following are general guidelines for applying improved posture and body mechanics to your everyday activities. Sitting  When sitting, keep your spine neutral and keep your feet flat on the floor. Use a footrest, if necessary, and keep your thighs parallel to the floor. Avoid rounding your shoulders, and avoid tilting your head forward. When working at a desk or a computer, keep your desk at a height where your hands are slightly lower than your elbows. Slide your chair under your desk so you are close enough to maintain good posture. When working at a computer,  place your monitor at a height where you are looking straight ahead and you do not have to tilt your head forward or downward to look at the screen. Standing  When standing, keep your spine neutral and keep your feet about hip-width apart. Keep a slight bend in your knees. Your ears, shoulders, and hips should line up. When you do a task in which you stand in one place for a long time, place one foot up on a stable object that is 2-4 inches (5-10 cm) high, such as a footstool. This helps keep your spine neutral. Resting When lying down and resting, avoid positions that are most painful for you. Try to support your neck in a neutral position. You can use a contour pillow or a small rolled-up towel. Your pillow should support your neck but not push on it. This information is not intended to replace advice given to you by your health care provider. Make sure you discuss any questions you have with your health care provider. Document Revised: 07/11/2021 Document Reviewed: 07/11/2021  Elsevier Patient Education  2023 [employer]   Back Exercises These exercises help to make your trunk and back strong. They also help to keep the lower back flexible. Doing these exercises can help to prevent or lessen pain in your lower back. If you have back pain, try to do these exercises 2-3 times each day or as told by your doctor. As you get better, do the exercises once each day. Repeat the exercises more often as told by your doctor. To stop back pain from coming back, do the exercises once each day, or as told by your doctor. Do exercises exactly as told by your doctor. Stop right away if you feel sudden pain or your pain gets worse. Exercises Single knee to chest Do these steps 3-5 times in a row for each leg: Lie on your back on a firm bed or the floor with your legs stretched out. Bring one knee to your chest. Grab your knee or thigh with both hands and hold it in place. Pull on your knee until you feel  a gentle stretch in your lower back or butt. Keep doing the stretch for 10-30 seconds. Slowly let go of your leg and straighten it. Pelvic tilt Do these steps 5-10 times in a row: Lie on your back on a firm bed or the floor with your legs stretched out. Bend your knees so they point up to the ceiling. Your feet should be flat on the floor. Tighten your lower belly (abdomen) muscles to press your lower back against the floor. This will make your tailbone point up to the ceiling instead of pointing down to your feet or the floor. Stay in this position for 5-10 seconds while you gently tighten your muscles and breathe evenly. Cat-cow Do these steps until your lower back bends more easily: Get on your hands and knees on a firm bed or the floor. Keep your hands under your shoulders, and keep your knees under your hips. You may put padding under your knees. Let your head hang down toward your chest. Tighten (contract) the muscles in your belly. Point your tailbone toward the floor so your lower back becomes rounded like the back of a cat. Stay in this position for 5 seconds. Slowly lift your head. Let the muscles of your belly relax. Point your tailbone up toward the ceiling so your back forms a sagging arch like the back of a cow. Stay in this position for 5 seconds.  Press-ups Do these steps 5-10 times in a row: Lie on your belly (face-down) on a firm bed or the floor. Place your hands near your head, about shoulder-width apart. While you keep your back relaxed and keep your hips on the floor, slowly straighten your arms to raise the top half of your body and lift your shoulders. Do not use your back muscles. You may change where you place your hands to make yourself more comfortable. Stay in this position for 5 seconds. Keep your back relaxed. Slowly return to lying flat on the floor.  Bridges Do these steps 10 times in a row: Lie on your back on a firm bed or the floor. Bend your knees so  they point up to the ceiling. Your feet should be flat on the floor. Your arms should be flat at your sides, next to your body. Tighten your butt muscles and lift your butt off the floor until your waist is almost as high as your knees. If you do not feel  the muscles working in your butt and the back of your thighs, slide your feet 1-2 inches (2.5-5 cm) farther away from your butt. Stay in this position for 3-5 seconds. Slowly lower your butt to the floor, and let your butt muscles relax. If this exercise is too easy, try doing it with your arms crossed over your chest. Belly crunches Do these steps 5-10 times in a row: Lie on your back on a firm bed or the floor with your legs stretched out. Bend your knees so they point up to the ceiling. Your feet should be flat on the floor. Cross your arms over your chest. Tip your chin a little bit toward your chest, but do not bend your neck. Tighten your belly muscles and slowly raise your chest just enough to lift your shoulder blades a tiny bit off the floor. Avoid raising your body higher than that because it can put too much stress on your lower back. Slowly lower your chest and your head to the floor. Back lifts Do these steps 5-10 times in a row: Lie on your belly (face-down) with your arms at your sides, and rest your forehead on the floor. Tighten the muscles in your legs and your butt. Slowly lift your chest off the floor while you keep your hips on the floor. Keep the back of your head in line with the curve in your back. Look at the floor while you do this. Stay in this position for 3-5 seconds. Slowly lower your chest and your face to the floor. Contact a doctor if: Your back pain gets a lot worse when you do an exercise. Your back pain does not get better within 2 hours after you exercise. If you have any of these problems, stop doing the exercises. Do not do them again unless your doctor says it is okay. Get help right away if: You have  sudden, very bad back pain. If this happens, stop doing the exercises. Do not do them again unless your doctor says it is okay. This information is not intended to replace advice given to you by your health care provider. Make sure you discuss any questions you have with your health care provider. Document Revised: 11/03/2020 Document Reviewed: 11/03/2020 Elsevier Patient Education  2023 [employer]vier Inc.    If you have been instructed to have an in-person evaluation today at a local Urgent Care facility, please use the link below. It will take you to a list of all of our available [facility], including address, phone number and hours of operation. Please do not delay care.  [facility]  If you or a family member do not have a primary care provider, use the link below to schedule a visit and establish care. When you choose a [facility] primary care physician or advanced practice provider, you gain a long-term partner in health. Find a Primary Care Provider  Learn more about [place]'s in-office and virtual care options: [place] - Get Care Now
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
## 2022-04-18 ENCOUNTER — Ambulatory Visit (INDEPENDENT_AMBULATORY_CARE_PROVIDER_SITE_OTHER): Payer: Medicaid Other | Admitting: Medical

## 2022-04-18 ENCOUNTER — Encounter: Payer: Self-pay | Admitting: Medical

## 2022-04-18 VITALS — BP 137/82 | HR 77 | Ht 66.0 in | Wt 364.8 lb

## 2022-04-18 DIAGNOSIS — Z6841 Body Mass Index (BMI) 40.0 and over, adult: Secondary | ICD-10-CM

## 2022-04-18 DIAGNOSIS — I10 Essential (primary) hypertension: Secondary | ICD-10-CM

## 2022-04-18 DIAGNOSIS — R002 Palpitations: Secondary | ICD-10-CM | POA: Diagnosis not present

## 2022-04-18 MED ORDER — LISINOPRIL 20 MG PO TABS: 20.0000 mg | ORAL_TABLET | Freq: Every day | ORAL | 3 refills | Status: DC

## 2022-04-18 NOTE — Patient Instructions (Signed)
Medication Instructions:   Your physician has recommended you make the following change in your medication:   START Lisinopril 20 mg daily   *If you need a refill on your cardiac medications before your next appointment, please call your pharmacy*   Lab Work:  BMET in 1 week  -  Please go to the [facility] Entrance at [facility] -  Check in at the Registration Desk: 1st desk to the right, past the screening table -  Valet Parking is offered if needed -  No appointment needed -  Lab hours: Monday- Friday (7:30 am- 5:30 pm)    Testing/Procedures:  None ordered   Follow-Up: At [facility], you and your health needs are our priority.  As part of our continuing mission to provide you with exceptional heart care, we have created designated Provider Care Teams.  These Care Teams include your primary Cardiologist (physician) and Advanced Practice Providers (APPs -  Physician Assistants and Nurse Practitioners) who all work together to provide you with the care you need, when you need it.  We recommend signing up for the patient portal called "MyChart".  Sign up information is provided on this After Visit Summary.  MyChart is used to connect with patients for Virtual Visits (Telemedicine).  Patients are able to view lab/test results, encounter notes, upcoming appointments, etc.  Non-urgent messages can be sent to your provider as well.   To learn more about what you can do with MyChart, go to [email or web address].    Your next appointment:   2 month(s)  The format for your next appointment:   In Person  Provider:   You may see Dr. Lorine Bears or one of the following Advanced Practice Providers on your designated Care Team:   Nicolasa Ducking, NP Eula Listen, PA-C Cadence Fransico Michael, [place]     Important Information About Sugar

## 2022-04-18 NOTE — Progress Notes (Signed)
Cardiology Office Note:    Date:  04/18/2022   ID:  Meoshia, Billing December 20, 1980, MRN [id number]  PCP:  Doren Custard, FNP  [facility] Cardiologist:  None  [facility] Electrophysiologist:  None   Referring MD: Doren Custard, FNP   Chief Complaint: 6 month follow-up  History of Present Illness:    Ashley Herring is a 41 y.o. female with a hx of  with a hx of  cardiomegaly and palpitations, remote syncope, morbid obesity, preeclampsia during pregnancy, EF 55-60% by echo who presents for 1-2 month.     In 2021 she developed preeclampsia and her blood pressure was close to 190 mmHg.  She had mild headache but no other symptoms.  There was mild swelling in lower extremities.  She required multiple antihypertensive medications.  She had a chest x-ray that showed cardiomegaly and BNP was mildly elevated at 195.  She had an echocardiogram done that showed an EF of 55 to 60% with mildly dilated left ventricle and mild LVH, mild mitral regurgitation with no other significant abnormalities.  The patient's blood pressure improved after delivery and she was discharged home on nifedipine.  She did not have these issues with 2 previous pregnancies.   Seen 01/29/20 and reported palpitations and heart monitor was ordered. Heart monitor showed I short run of SVT, otherwise normal.    Seen 06/01/21 and reported palpitations. 2 week heart monitor was ordered. Heart monitor showed NSR with rare PACs and PVCs with no significant arrhythmia. Labetolol was stopped and Lopressor was started.    Seen 08/05/21 and she reported improved palpitations. Lopressor was increased to 100mg  BID.    Seen 08/15/21 and was doing OK, BP a little high. Amlodipine was increased to 10mg  daily.   Last seen 09/28/21 and was going to the gym, and was overall stable from a cardiac perspective.   Today, the patient feels some gas in the lower chest with some fluttering. She says her stress has been overall good. She  reported occasional high blood pressures. She is still trying to lose weight, but has not noted a decrease on the scale. She tried the watermelon diet. She is walking 3-4 miles at work. She is not eating past 6pm. She is interested in nutritionist referral. BP at home is high 140-150/80.   Past Medical History:  Diagnosis Date   Back pain    Eye inflammation    Miscarriage    Sciatica     Past Surgical History:  Procedure Laterality Date   COLONOSCOPY WITH PROPOFOL N/A 02/08/2021   Procedure: COLONOSCOPY WITH PROPOFOL;  Surgeon: 09/30/21, MD;  Location: [facility] ENDOSCOPY;  Service: Endoscopy;  Laterality: N/A;   HEMORRHOID SURGERY      Current Medications: Current Meds  Medication Sig   amLODipine (NORVASC) 10 MG tablet Take 1 tablet (10 mg total) by mouth daily.   cyclobenzaprine (FLEXERIL) 10 MG tablet Take 0.5-1 tablets (5-10 mg total) by mouth 3 (three) times daily as needed for muscle spasms.   lisinopril (ZESTRIL) 20 MG tablet Take 1 tablet (20 mg total) by mouth daily.   metoprolol tartrate (LOPRESSOR) 100 MG tablet Take 1 tablet (100 mg total) by mouth 2 (two) times daily.     Allergies:   Peanuts [peanut oil]   Social History   Socioeconomic History   Marital status: Married    Spouse name: Midge Minium   Number of children: 2   Years of education: Not on file   Highest education level:  Not on file  Occupational History   Not on file  Tobacco Use   Smoking status: Former    Types: Cigarettes    Quit date: 01/31/2016    Years since quitting: 6.2   Smokeless tobacco: Never   Tobacco comments:    Smoked one cigarette occassionally  Vaping Use   Vaping Use: Never used  Substance and Sexual Activity   Alcohol use: Yes    Comment: occasionally    Drug use: Never   Sexual activity: Yes    Partners: Male    Birth control/protection: None  Other Topics Concern   Not on file  Social History Narrative   Not on file   Social Determinants of Health   Financial  Resource Strain: Low Risk  (01/31/2019)   Overall Financial Resource Strain (CARDIA)    Difficulty of Paying Living Expenses: Not hard at all  Food Insecurity: No Food Insecurity (01/31/2019)   Hunger Vital Sign    Worried About Running Out of Food in the Last Year: Never true    Ran Out of Food in the Last Year: Never true  Transportation Needs: No Transportation Needs (01/31/2019)   PRAPARE - Administrator, Civil Service (Medical): No    Lack of Transportation (Non-Medical): No  Physical Activity: Sufficiently Active (01/31/2019)   Exercise Vital Sign    Days of Exercise per Week: 7 days    Minutes of Exercise per Session: 40 min  Stress: Stress Concern Present (01/31/2019)   [employer] of Occupational Health - Occupational Stress Questionnaire    Feeling of Stress : To some extent  Social Connections: Socially Integrated (01/31/2019)   Social Connection and Isolation Panel [NHANES]    Frequency of Communication with Friends and Family: More than three times a week    Frequency of Social Gatherings with Friends and Family: More than three times a week    Attends Religious Services: 1 to 4 times per year    Active Member of [employer] or Organizations: Not on file    Attends Banker Meetings: 1 to 4 times per year    Marital Status: Married     Family History: The patient's family history includes Asthma in her daughter; Brain cancer in her maternal grandmother and paternal grandmother; Breast cancer (age of onset: 27) in her paternal aunt; Cancer in her maternal grandmother; Colon cancer in her paternal grandfather; Diabetes in her father and mother; Hypertension in her father and mother.  ROS:   Please see the history of present illness.     All other systems reviewed and are negative.  EKGs/Labs/Other Studies Reviewed:    The following studies were reviewed today:  Heart monitor 06/2021 Patient had a min HR of 52 bpm, max HR of 129 bpm, and avg HR of  78 bpm.  Predominant underlying rhythm was Sinus Rhythm.  Rare PACs and rare PVCs. No significant arrhythmia.   Echo 06/2021  1. Challenging image quality   2. Left ventricular ejection fraction, by estimation, is 60 to 65%. The  left ventricle has normal function. The left ventricle has no regional  wall motion abnormalities. Left ventricular diastolic parameters are  consistent with Grade II diastolic  dysfunction (pseudonormalization).   3. Right ventricular systolic function is normal. The right ventricular  size is normal. There is normal pulmonary artery systolic pressure. The  estimated right ventricular systolic pressure is 34.4 mmHg.   4. The mitral valve was not well visualized. Mild mitral valve  regurgitation. No evidence of mitral stenosis.   EKG:  EKG is not ordered today.    Recent Labs: 06/01/2021: BUN 10; Creatinine, Ser 0.63; Hemoglobin 11.7; Magnesium 2.0; Platelets 412; Potassium 4.1; Sodium 140  Recent Lipid Panel    Component Value Date/Time   CHOL 240 (H) 03/16/2021 1633   TRIG 161 (H) 03/16/2021 1633   HDL 44 03/16/2021 1633   CHOLHDL 5.5 (H) 03/16/2021 1633   CHOLHDL 5.4 (H) 01/31/2019 1059   LDLCALC 166 (H) 03/16/2021 1633   LDLCALC 156 (H) 01/31/2019 1059    Physical Exam:    VS:  BP 137/82 (BP Location: Left Arm, Patient Position: Sitting, Cuff Size: Large)   Pulse 77   Ht 5\' 6"  (1.676 m)   Wt (!) 364 lb 12.8 oz (165.5 kg)   SpO2 99%   BMI 58.88 kg/m     Wt Readings from Last 3 Encounters:  04/18/22 (!) 364 lb 12.8 oz (165.5 kg)  09/28/21 (!) 364 lb (165.1 kg)  09/07/21 (!) 365 lb (165.6 kg)     GEN:  Well nourished, well developed in no acute distress HEENT: Normal NECK: No JVD; No carotid bruits LYMPHATICS: No lymphadenopathy CARDIAC: RRR, no murmurs, rubs, gallops RESPIRATORY:  Clear to auscultation without rales, wheezing or rhonchi  ABDOMEN: Soft, non-tender, non-distended MUSCULOSKELETAL:  No edema; No deformity  SKIN: Warm  and dry NEUROLOGIC:  Alert and oriented x 3 PSYCHIATRIC:  Normal affect   ASSESSMENT:    1. Essential hypertension   2. Palpitations   3. BMI 50.0-59.9, adult (HCC)    PLAN:    In order of problems listed above:  HTN BP is still high. I will add Lisinopril 20mg  daily, BMET in a week. Continue Lopressor 100mg  BID and amlodipine 10mg  daily. She is working on lifestyle changes.   Palpitations She has occasional palpitations. Well controlled on Lopressor.   Obesity She is trying to lose weight with activity and diet changes. I will send in a referral to nutritionist for weight loss.  Disposition: Follow up in 2 month(s) with MD/APP    Signed, Amel Kitch 12-31-1993, PA-C  04/18/2022 5:56 PM    [facility]

## 2022-05-02 NOTE — Addendum Note (Signed)
Addended by: Auburn Bilberry D on: 05/02/2022 08:14 AM   Modules accepted: Orders

## 2022-05-04 ENCOUNTER — Ambulatory Visit: Payer: Self-pay
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
## 2022-05-29 ENCOUNTER — Ambulatory Visit (INDEPENDENT_AMBULATORY_CARE_PROVIDER_SITE_OTHER): Payer: Medicaid Other | Admitting: Family Medicine

## 2022-05-29 ENCOUNTER — Encounter: Payer: Self-pay | Admitting: Family Medicine

## 2022-05-29 VITALS — BP 134/86 | HR 86 | Ht 66.0 in | Wt 365.0 lb

## 2022-05-29 DIAGNOSIS — I10 Essential (primary) hypertension: Secondary | ICD-10-CM

## 2022-05-29 DIAGNOSIS — F419 Anxiety disorder, unspecified: Secondary | ICD-10-CM

## 2022-05-29 DIAGNOSIS — F32A Depression, unspecified: Secondary | ICD-10-CM | POA: Diagnosis not present

## 2022-05-29 MED ORDER — TRAZODONE HCL 50 MG PO TABS: 50.0000 mg | ORAL_TABLET | Freq: Every evening | ORAL | 0 refills | Status: DC | PRN

## 2022-05-29 MED ORDER — SERTRALINE HCL 50 MG PO TABS: 50.0000 mg | ORAL_TABLET | Freq: Every day | ORAL | 3 refills | Status: DC

## 2022-05-29 NOTE — Assessment & Plan Note (Signed)
Chronic condition, elevated readings noted, per patient improvement in general, has been compliant with medications, denies any cardiopulmonary complaints.  From physical examination standpoint she has positive S1-S2, regular rate and rhythm, no additional heart sounds, clear lung fields throughout without wheezes, rales, rhonchi.

## 2022-05-29 NOTE — Progress Notes (Signed)
     Primary Care / Sports Medicine Office Visit  Patient Information:  Patient ID: Ashley Herring, female DOB: Oct 18, 1980 Age: 41 y.o. MRN: [id number]   Ashley Herring is a pleasant 40 y.o. female presenting with the following:  Chief Complaint  Patient presents with   [place] like she either has Anxiety and ADHD      Vitals:   05/29/22 1517  BP: 134/86  Pulse: 86  SpO2: 99%   Vitals:   05/29/22 1517  Weight: (!) 365 lb (165.6 kg)  Height: 5\' 6"  (1.676 m)   Body mass index is 58.91 kg/m.  No results found.   Independent interpretation of notes and tests performed by another provider:   None  Procedures performed:   None  Pertinent History, Exam, Impression, and Recommendations:   Problem List Items Addressed This Visit       Cardiovascular and Mediastinum   Hypertension    Chronic condition, elevated readings noted, per patient improvement in general, has been compliant with medications, denies any cardiopulmonary complaints.  From physical examination standpoint she has positive S1-S2, regular rate and rhythm, no additional heart sounds, clear lung fields throughout without wheezes, rales, rhonchi.        Other   Anxiety and depression - Primary    Chronic condition, previously treated with unspecified medication for depression roughly 16 years prior which she felt was very beneficial, self discontinued after several months of treatment.  Has been noticing worsening stress, issues with focus, inability to stay asleep, no SI/HI.  Treatment strategies reviewed, will place a referral to psychiatry for ADHD evaluation, additionally start sertraline 50 mg daily, nightly as needed 50-100 mg trazodone, and close follow-up in 6 weeks for reevaluation.      Relevant Medications   sertraline (ZOLOFT) 50 MG tablet   traZODone (DESYREL) 50 MG tablet   Other Relevant Orders   Ambulatory referral to Psychology     Orders &  Medications Meds ordered this encounter  Medications   sertraline (ZOLOFT) 50 MG tablet    Sig: Take 1 tablet (50 mg total) by mouth daily.    Dispense:  30 tablet    Refill:  3   traZODone (DESYREL) 50 MG tablet    Sig: Take 1-2 tablets (50-100 mg total) by mouth at bedtime as needed for sleep.    Dispense:  60 tablet    Refill:  0   Orders Placed This Encounter  Procedures   Ambulatory referral to Psychology     Return in about 6 weeks (around 07/10/2022) for physical.     Montel Culver, MD   [place]

## 2022-05-29 NOTE — Patient Instructions (Addendum)
-   Start sertraline daily - Dose trazodone, 1-2 tablets at bedtime as needed for sleep - Referral coordinator will contact you to schedule visit with psychiatry - Reviewed information attached about nonmedication based ways to treat stress - Return for annual physical in 6 weeks, contact for any questions/concerns between now and then

## 2022-05-29 NOTE — Assessment & Plan Note (Signed)
Chronic condition, previously treated with unspecified medication for depression roughly 16 years prior which she felt was very beneficial, self discontinued after several months of treatment.  Has been noticing worsening stress, issues with focus, inability to stay asleep, no SI/HI.  Treatment strategies reviewed, will place a referral to psychiatry for ADHD evaluation, additionally start sertraline 50 mg daily, nightly as needed 50-100 mg trazodone, and close follow-up in 6 weeks for reevaluation.

## 2022-06-08 ENCOUNTER — Other Ambulatory Visit: Payer: Self-pay | Admitting: Family Medicine

## 2022-06-08 DIAGNOSIS — F419 Anxiety disorder, unspecified: Secondary | ICD-10-CM

## 2022-06-08 MED ORDER — TRAZODONE HCL 50 MG PO TABS: 50.0000 mg | ORAL_TABLET | Freq: Every evening | ORAL | 0 refills | Status: DC | PRN

## 2022-06-08 MED ORDER — SERTRALINE HCL 50 MG PO TABS: 50.0000 mg | ORAL_TABLET | Freq: Every day | ORAL | 3 refills | Status: DC

## 2022-06-08 NOTE — Telephone Encounter (Signed)
Medication Refill - Medication: sertraline (ZOLOFT) 50 MG tablet  Has the patient contacted their pharmacy? yes (Agent: If no, request that the patient contact the pharmacy for the refill. If patient does not wish to contact the pharmacy document the reason why and proceed with request.) (Agent: If yes, when and what did the pharmacy advise?)contact pcp/patient originally requested [facility] and they say they don't so now she would rather this be sent to [facility]  Preferred Pharmacy (with phone number or street name): [address] [place], [place] [address] Has the patient been seen for an appointment in the last year OR does the patient have an upcoming appointment? yes  Agent: Please be advised that RX refills may take up to 3 business days. We ask that you follow-up with your pharmacy.

## 2022-06-08 NOTE — Telephone Encounter (Signed)
Change in pharmacy- rx forwarded to alternative pharmacy Requested Prescriptions  Pending Prescriptions Disp Refills  . sertraline (ZOLOFT) 50 MG tablet 30 tablet 3    Sig: Take 1 tablet (50 mg total) by mouth daily.     Psychiatry:  Antidepressants - SSRI - sertraline Failed - 06/08/2022 10:25 AM      Failed - AST in normal range and within 360 days    AST  Date Value Ref Range Status  03/16/2021 18 0 - 40 IU/L Final   SGOT(AST)  Date Value Ref Range Status  06/26/2013 18 15 - 37 Unit/L Final         Failed - ALT in normal range and within 360 days    ALT  Date Value Ref Range Status  03/16/2021 18 0 - 32 IU/L Final   SGPT (ALT)  Date Value Ref Range Status  06/26/2013 20 12 - 78 U/L Final         Passed - Completed PHQ-2 or PHQ-9 in the last 360 days      Passed - Valid encounter within last 6 months    Recent Outpatient Visits          1 week ago Anxiety and depression   [facility] [facility] [place], Earley Abide, MD   2 years ago Pruritus   [facility] Delsa Grana, PA-C   2 years ago Mixed hyperlipidemia   [place], FNP   3 years ago Positive pregnancy test   [facility] Delsa Grana, PA-C   3 years ago Well woman exam with routine gynecological exam   [place], FNP      Future Appointments            In 2 weeks Kathlen Mody, Cadence H, PA-C [place]. [place]   In 1 month Zigmund Daniel, Earley Abide, MD [place] [facility] at [facility], [facility]

## 2022-06-08 NOTE — Addendum Note (Signed)
Addended by: Erie Noe on: 06/08/2022 03:38 PM   Modules accepted: Orders

## 2022-06-08 NOTE — Telephone Encounter (Signed)
Sending to different pharmacy per request from pt d/t having difficulties with [facility].   Requested Prescriptions  Pending Prescriptions Disp Refills  . traZODone (DESYREL) 50 MG tablet 60 tablet 0    Sig: Take 1-2 tablets (50-100 mg total) by mouth at bedtime as needed for sleep.     Psychiatry: Antidepressants - Serotonin Modulator Passed - 06/08/2022  3:38 PM      Passed - Completed PHQ-2 or PHQ-9 in the last 360 days      Passed - Valid encounter within last 6 months    Recent Outpatient Visits          1 week ago Anxiety and depression   [place] [facility] at [place], Earley Abide, MD   2 years ago Pruritus   [facility] Delsa Grana, PA-C   2 years ago Mixed hyperlipidemia   [place], FNP   3 years ago Positive pregnancy test   [facility] Delsa Grana, PA-C   3 years ago Well woman exam with routine gynecological exam   [place], FNP      Future Appointments            In 2 weeks Kathlen Mody, Cadence H, PA-C [place]. [place]   In 1 month Zigmund Daniel, Earley Abide, MD [facility] Primary Care and [facility] Medicine at [facility], [facility]           Signed Prescriptions Disp Refills   sertraline (ZOLOFT) 50 MG tablet 30 tablet 3    Sig: Take 1 tablet (50 mg total) by mouth daily.     Psychiatry:  Antidepressants - SSRI - sertraline Failed - 06/08/2022 10:25 AM      Failed - AST in normal range and within 360 days    AST  Date Value Ref Range Status  03/16/2021 18 0 - 40 IU/L Final   SGOT(AST)  Date Value Ref Range Status  06/26/2013 18 15 - 37 Unit/L Final         Failed - ALT in normal range and within 360 days    ALT  Date Value Ref Range Status  03/16/2021 18 0 - 32 IU/L Final   SGPT (ALT)  Date Value Ref Range Status  06/26/2013 20 12 - 78 U/L Final         Passed -  Completed PHQ-2 or PHQ-9 in the last 360 days      Passed - Valid encounter within last 6 months    Recent Outpatient Visits          1 week ago Anxiety and depression   [place] [facility] at [place], Earley Abide, MD   2 years ago Pruritus   [facility] Delsa Grana, PA-C   2 years ago Mixed hyperlipidemia   [place], FNP   3 years ago Positive pregnancy test   [facility] Delsa Grana, PA-C   3 years ago Well woman exam with routine gynecological exam   [place], FNP      Future Appointments            In 2 weeks Kathlen Mody, Cadence H, PA-C [place]. [place]   In 1 month Zigmund Daniel,  Ocie Bob, MD [facility] Primary Care and Sports Medicine at [facility], [facility]

## 2022-06-15 ENCOUNTER — Encounter: Payer: Medicaid Other | Attending: Medical | Admitting: Dietician

## 2022-06-15 ENCOUNTER — Encounter: Payer: Self-pay | Admitting: Dietician

## 2022-06-15 VITALS — Ht 66.0 in | Wt 354.4 lb

## 2022-06-15 DIAGNOSIS — Z6841 Body Mass Index (BMI) 40.0 and over, adult: Secondary | ICD-10-CM

## 2022-06-15 DIAGNOSIS — E785 Hyperlipidemia, unspecified: Secondary | ICD-10-CM | POA: Diagnosis not present

## 2022-06-15 DIAGNOSIS — E669 Obesity, unspecified: Secondary | ICD-10-CM

## 2022-06-15 DIAGNOSIS — E668 Other obesity: Secondary | ICD-10-CM

## 2022-06-15 DIAGNOSIS — I10 Essential (primary) hypertension: Secondary | ICD-10-CM | POA: Diagnosis not present

## 2022-06-15 DIAGNOSIS — Z713 Dietary counseling and surveillance: Secondary | ICD-10-CM | POA: Diagnosis not present

## 2022-06-15 DIAGNOSIS — E782 Mixed hyperlipidemia: Secondary | ICD-10-CM

## 2022-06-15 NOTE — Progress Notes (Signed)
Medical Nutrition Therapy: Visit start time: 1500  end time: 1600  Assessment:   Referral Diagnosis: obesity Other medical history/ diagnoses: hypertension, hyperlipidemia Psychosocial issues/ stress concerns: history of anxiety and depression, takes sertraline and traZODone  Medications, supplements: reconciled list in medical record   Preferred learning method:  Auditory Visual Hands-on    Current weight: 354,4lbs Height: 5'6" BMI: 57.2 Patient's personal weight goal: 250lbs  Progress and evaluation:  Patient reports 5-50lb weight gain during pregnancies, then losing 50-60+ lbs soon after each pregnancy, followed by weight gain a few months later.  Has tried diet changes multiple times with little success. Also diet supplement pills did not work. She reports foot pain when standing and walking; has DDD Lab results from 03/16/21 indicate HbA1C 5.9%, total cholesterol 240, HDL 44, LDL 166, triglycerides 161 Food allergies: patient reports mild reactions to peanuts, citrus fruits -- oral rash, headache Special diet practices: drinking lemon ginger honey tea daily (brother in law said it would help with weight loss) Patient seeks help with weight loss to improve physical and mental health. Next PCP appt is 07/10/22   Dietary Intake:  Usual eating pattern includes 1-2 meals and 2-3 snacks per day. Dining out frequency: 5-6 meals per week. Who plans meals/ buys groceries? Self, spouse Who prepares meals? Self, spouse  Breakfast: usually none; drinks coffee 10oz + creamer Snack: 11am --at work pretzels/ brelvita biscuit/  Lunch: sometimes skips, sometimes out -- takeout or at home  Snack: 3pm cheese and crackers/ yogurt/  Supper: cooks about 3 times a week; out often on weekends and Mondays, occ cereal Snack: occ cookies; usually none Beverages: water 2-3c daily; regular soda (for caffeine); sweet tea, lemon ginger tea once daily, coffee in am  Physical activity: walking 30  minutes, 2 times a week   Intervention:   Nutrition Care Education:   Basic nutrition: basic food groups; appropriate nutrient balance; appropriate meal and snack schedule; general nutrition guidelines; basic meal planning   Weight control: importance of low sugar and low fat choices; portion control; estimated energy needs for weight loss at 1600 kcal, provided guidance for 45% CHO, 25% pro, 30% fat Diabetes prevention:  appropriate meal and snack schedule; appropriate carb intake and balance, healthy carb choices; role of fiber, protein, fat; physical activity; effects of stress Hyperlipidemia: healthy and unhealthy fats; role of fiber, plant sterols; role of exercise   Other intervention notes: Patient voices readiness for change; discussed strategies to make healthy changes and weight loss sustainable Established goals with direction from patient    Nutritional Diagnosis:  [place]-2.2 Altered nutrition-related laboratory As related to hyperlipidemia, increased risk for type 2 diabetes.  As evidenced by elevated total and LDL cholesterol, elevated triglycerides, and elevated HbA1C. [place]-3.3 Overweight/obesity As related to history of weight and diet cycling, excess calories, frequent skipped meals.  As evidenced by patient with current BMI of 57.2.   Education Materials given:  General diet guidelines for Diabetes (prevention) Plate Planner with food lists, sample meal pattern Sample menus Snacking handout Visit summary with goals/ instructions   Learner/ who was taught:  Patient   Level of understanding: Verbalizes/ demonstrates competency   Demonstrated degree of understanding via:   Teach back Learning barriers: None  Willingness to learn/ readiness for change: Acceptance, ready for change   Monitoring and Evaluation:  Dietary intake, exercise, pain, BG control, blood lipid, BP, and body weight      follow up:  08/03/22 at 3:00pm

## 2022-06-15 NOTE — Patient Instructions (Addendum)
Plan to have a meal or snack every 3-5 hours during the day.  Balance meals by having a protein + small portion starch + a veg or fruit or both.  Make or buy lower fat sauces, use less or lower fat condiments like mayo, butter/ margarine, salad dressings Continue to limit sugar sweetened drinks; try 1/2-sweet tea or gradually reduce the amount of sugar. Keep up regular activity/ exercise!

## 2022-06-23 ENCOUNTER — Ambulatory Visit: Payer: Medicaid Other | Admitting: Medical

## 2022-06-23 NOTE — Progress Notes (Deleted)
Cardiology Office Note:    Date:  06/23/2022   ID:  Ashley Herring 1980/09/06, MRN [id number]  PCP:  Montel Culver, MD  [facility] Cardiologist:  None  [facility] Electrophysiologist:  None   Referring MD: Hubbard Hartshorn, FNP   Chief Complaint: 2 month follow-up  History of Present Illness:    Ashley Herring is a 41 y.o. female with a hx of cardiomegaly and palpitations, remote syncope, morbid obesity, preeclampsia during pregnancy, EF 55-60% by echo who presents for 1-2 month.     In 2021 she developed preeclampsia and her blood pressure was close to 190 mmHg.  She had mild headache but no other symptoms.  There was mild swelling in lower extremities.  She required multiple antihypertensive medications.  She had a chest x-ray that showed cardiomegaly and BNP was mildly elevated at 195.  She had an echocardiogram done that showed an EF of 55 to 60% with mildly dilated left ventricle and mild LVH, mild mitral regurgitation with no other significant abnormalities.  The patient's blood pressure improved after delivery and she was discharged home on nifedipine.  She did not have these issues with 2 previous pregnancies.   Seen 01/29/20 and reported palpitations and heart monitor was ordered. Heart monitor showed I short run of SVT, otherwise normal.    Seen 06/01/21 and reported palpitations. 2 week heart monitor was ordered. Heart monitor showed NSR with rare PACs and PVCs with no significant arrhythmia. Labetolol was stopped and Lopressor was started.    Seen 08/05/21 and she reported improved palpitations. Lopressor was increased to 100mg  BID.    Seen 08/15/21 and was doing OK, BP a little high. Amlodipine was increased to 10mg  daily.   Last seen 04/18/22 and reported gas in the chest with some fluttering.  Past Medical History:  Diagnosis Date   Back pain    Eye inflammation    Miscarriage    Sciatica     Past Surgical History:  Procedure Laterality Date    COLONOSCOPY WITH PROPOFOL N/A 02/08/2021   Procedure: COLONOSCOPY WITH PROPOFOL;  Surgeon: Lucilla Lame, MD;  [facility] END[facility]COPY;  Service: Endoscopy;  Laterality: N/A;   HEMORRHOID SURGERY      Current Medications: No outpatient medications have been marked as taking for the 06/23/22 encounter (Appointment) with Kathlen Mody, Syann Cupples H, PA-C.     Allergies:   Peanuts [peanut oil]   Social History   Socioeconomic History   Marital status: Married    Spouse name: Cornelia Copa   Number of children: 2   Years of education: Not on file   Highest education level: Not on file  Occupational History   Not on file  Tobacco Use   Smoking status: Some Days    Types: Cigarettes   Smokeless tobacco: Never   Tobacco comments:    Smoked one cigarette occassionally  Vaping Use   Vaping Use: Never used  Substance and Sexual Activity   Alcohol use: Yes    Comment: occasionally    Drug use: Never   Sexual activity: Yes    Partners: Male    Birth control/protection: None  Other Topics Concern   Not on file  Social History Narrative   Not on file   Social Determinants of Health   Financial Resource Strain: Low Risk  (01/31/2019)   Overall Financial Resource Strain (CARDIA)    Difficulty of Paying Living Expenses: Not hard at all  Food Insecurity: No Food Insecurity (05/29/2022)   Hunger Vital  Sign    Worried About Programme researcher, broadcasting/film/video in the Last Year: Never true    Ran Out of Food in the Last Year: Never true  Transportation Needs: No Transportation Needs (05/29/2022)   PRAPARE - Administrator, Civil Service (Medical): No    Lack of Transportation (Non-Medical): No  Physical Activity: Sufficiently Active (01/31/2019)   Exercise Vital Sign    Days of Exercise per Week: 7 days    Minutes of Exercise per Session: 40 min  Stress: Stress Concern Present (01/31/2019)   [employer] of Occupational Health - Occupational Stress Questionnaire    Feeling of Stress : To some extent   Social Connections: Socially Integrated (01/31/2019)   Social Connection and Isolation Panel [NHANES]    Frequency of Communication with Friends and Family: More than three times a week    Frequency of Social Gatherings with Friends and Family: More than three times a week    Attends Religious Services: 1 to 4 times per year    Active Member of [employer] or Organizations: Not on file    Attends Banker Meetings: 1 to 4 times per year    Marital Status: Married     Family History: The patient's family history includes Asthma in her daughter; Brain cancer in her maternal grandmother and paternal grandmother; Breast cancer (age of onset: 52) in her paternal aunt; Cancer in her maternal grandmother; Colon cancer in her paternal grandfather; Diabetes in her father and mother; Hypertension in her father and mother; Thyroid disease in her mother.  ROS:   Please see the history of present illness.     All other systems reviewed and are negative.  EKGs/Labs/Other Studies Reviewed:    The following studies were reviewed today:   Heart monitor 06/2021 Patient had a min HR of 52 bpm, max HR of 129 bpm, and avg HR of 78 bpm.  Predominant underlying rhythm was Sinus Rhythm.  Rare PACs and rare PVCs. No significant arrhythmia.   Echo 06/2021  1. Challenging image quality   2. Left ventricular ejection fraction, by estimation, is 60 to 65%. The  left ventricle has normal function. The left ventricle has no regional  wall motion abnormalities. Left ventricular diastolic parameters are  consistent with Grade II diastolic  dysfunction (pseudonormalization).   3. Right ventricular systolic function is normal. The right ventricular  size is normal. There is normal pulmonary artery systolic pressure. The  estimated right ventricular systolic pressure is 34.4 mmHg.   4. The mitral valve was not well visualized. Mild mitral valve  regurgitation. No evidence of mitral stenosis.   EKG:  EKG  is *** ordered today.  The ekg ordered today demonstrates ***  Recent Labs: No results found for requested labs within last 365 days.  Recent Lipid Panel    Component Value Date/Time   CHOL 240 (H) 03/16/2021 1633   TRIG 161 (H) 03/16/2021 1633   HDL 44 03/16/2021 1633   CHOLHDL 5.5 (H) 03/16/2021 1633   CHOLHDL 5.4 (H) 01/31/2019 1059   LDLCALC 166 (H) 03/16/2021 1633   LDLCALC 156 (H) 01/31/2019 1059     Risk Assessment/Calculations:   {Does this patient have ATRIAL FIBRILLATION?:[phone]}   Physical Exam:    VS:  There were no vitals taken for this visit.    Wt Readings from Last 3 Encounters:  06/15/22 (!) 354 lb 6.4 oz (160.8 kg)  05/29/22 (!) 365 lb (165.6 kg)  04/18/22 (!) 364 lb 12.8  oz (165.5 kg)     GEN: *** Well nourished, well developed in no acute distress HEENT: Normal NECK: No JVD; No carotid bruits LYMPHATICS: No lymphadenopathy CARDIAC: ***RRR, no murmurs, rubs, gallops RESPIRATORY:  Clear to auscultation without rales, wheezing or rhonchi  ABDOMEN: Soft, non-tender, non-distended MUSCULOSKELETAL:  No edema; No deformity  SKIN: Warm and dry NEUROLOGIC:  Alert and oriented x 3 PSYCHIATRIC:  Normal affect   ASSESSMENT:    No diagnosis found. PLAN:    In order of problems listed above:  HTN  Palpitations  Obesity    Disposition: Follow up {follow up:15908} with ***   Shared Decision Making/Informed Consent   {Are you ordering a CV Procedure (e.g. stress test, cath, DCCV, TEE, etc)?   Press F2        :[id number]}    Signed, Shevon Sian David Stall, PA-C  06/23/2022 10:03 AM    [facility]

## 2022-07-04 ENCOUNTER — Ambulatory Visit: Payer: Medicaid Other | Admitting: Family Medicine

## 2022-07-05 ENCOUNTER — Other Ambulatory Visit: Payer: Self-pay | Admitting: Family Medicine

## 2022-07-05 DIAGNOSIS — F419 Anxiety disorder, unspecified: Secondary | ICD-10-CM

## 2022-07-05 NOTE — Telephone Encounter (Signed)
Requested Prescriptions  Pending Prescriptions Disp Refills  . traZODone (DESYREL) 50 MG tablet [Pharmacy Med Name: TRAZODONE 50MG  TABLETS] 60 tablet 0    Sig: TAKE 1 TO 2 TABLETS(50 TO 100 MG) BY MOUTH AT BEDTIME AS NEEDED FOR SLEEP     Psychiatry: Antidepressants - Serotonin Modulator Passed - 07/05/2022  6:21 AM      Passed - Completed PHQ-2 or PHQ-9 in the last 360 days      Passed - Valid encounter within last 6 months    Recent Outpatient Visits          1 month ago Anxiety and depression   [place] [facility] at [place], Earley Abide, MD   2 years ago Pruritus   [facility] Delsa Grana, PA-C   2 years ago Mixed hyperlipidemia   [place], FNP   3 years ago Positive pregnancy test   [facility] Delsa Grana, PA-C   3 years ago Well woman exam with routine gynecological exam   [place], FNP      Future Appointments            In 5 days Zigmund Daniel, Earley Abide, MD [facility] Primary Care and Sports Medicine at [facility], [facility]   In 1 week Kathlen Mody, [employer], PA-C [place]. [place]

## 2022-07-10 ENCOUNTER — Encounter: Payer: Self-pay | Admitting: Family Medicine

## 2022-07-10 ENCOUNTER — Ambulatory Visit (INDEPENDENT_AMBULATORY_CARE_PROVIDER_SITE_OTHER): Payer: Medicaid Other | Admitting: Family Medicine

## 2022-07-10 VITALS — BP 124/78 | HR 80 | Ht 66.0 in | Wt 350.0 lb

## 2022-07-10 DIAGNOSIS — Z0000 Encounter for general adult medical examination without abnormal findings: Secondary | ICD-10-CM

## 2022-07-10 DIAGNOSIS — I10 Essential (primary) hypertension: Secondary | ICD-10-CM | POA: Diagnosis not present

## 2022-07-10 DIAGNOSIS — F32A Depression, unspecified: Secondary | ICD-10-CM

## 2022-07-10 DIAGNOSIS — Z1159 Encounter for screening for other viral diseases: Secondary | ICD-10-CM

## 2022-07-10 DIAGNOSIS — R7989 Other specified abnormal findings of blood chemistry: Secondary | ICD-10-CM | POA: Diagnosis not present

## 2022-07-10 DIAGNOSIS — E785 Hyperlipidemia, unspecified: Secondary | ICD-10-CM

## 2022-07-10 DIAGNOSIS — F419 Anxiety disorder, unspecified: Secondary | ICD-10-CM | POA: Diagnosis not present

## 2022-07-10 MED ORDER — TRAZODONE HCL 50 MG PO TABS: 50.0000 mg | ORAL_TABLET | Freq: Every evening | ORAL | 2 refills | Status: DC | PRN

## 2022-07-10 MED ORDER — AMLODIPINE BESYLATE 10 MG PO TABS: 10.0000 mg | ORAL_TABLET | Freq: Every day | ORAL | 0 refills | Status: DC

## 2022-07-10 MED ORDER — LISINOPRIL 20 MG PO TABS: 20.0000 mg | ORAL_TABLET | Freq: Every day | ORAL | 0 refills | Status: DC

## 2022-07-10 MED ORDER — SERTRALINE HCL 100 MG PO TABS: 100.0000 mg | ORAL_TABLET | Freq: Every day | ORAL | 0 refills | Status: DC

## 2022-07-10 NOTE — Assessment & Plan Note (Signed)
Improvement on sertraline 50 mg, discussed titration options, is amenable to further titration to 100 mg daily.  Can continue nightly 50-100 mg trazodone as needed.

## 2022-07-10 NOTE — Patient Instructions (Signed)
-   Obtain fasting labs with orders provided (can have water or black coffee but otherwise no food or drink x 8 hours before labs) - Review information provided - Attend eye doctor annually, dentist every 6 months, work towards or maintain 30 minutes of moderate intensity physical activity at least 5 days per week, and consume a balanced diet - Return in 3 months - Contact us for any questions between now and then 

## 2022-07-10 NOTE — Assessment & Plan Note (Signed)
Repeat labs ordered today 

## 2022-07-10 NOTE — Assessment & Plan Note (Signed)
Chronic, stable and improved over interval visit, risk stratification labs ordered.

## 2022-07-10 NOTE — Progress Notes (Signed)
Annual Physical Exam Visit  Patient Information:  Patient ID: Ashley Herring, female DOB: June 18, 1981 Age: 41 y.o. MRN: [id number]   Subjective:   CC: Annual Physical Exam  HPI:  Ashley Herring is here for their annual physical.  I reviewed the past medical history, family history, social history, surgical history, and allergies today and changes were made as necessary.  Please see the problem list section below for additional details.  Past Medical History: Past Medical History:  Diagnosis Date   Back pain    Eye inflammation    Miscarriage    Sciatica    Past Surgical History: Past Surgical History:  Procedure Laterality Date   COLONOSCOPY WITH PROPOFOL N/A 02/08/2021   Procedure: COLONOSCOPY WITH PROPOFOL;  Surgeon: Lucilla Lame, MD;  Location: [facility];  Service: Endoscopy;  Laterality: N/A;   HEMORRHOID SURGERY     Family History: Family History  Problem Relation Age of Onset   Hypertension Mother    Diabetes Mother    Thyroid disease Mother    Hypertension Father    Diabetes Father    Asthma Daughter    Breast cancer Paternal Aunt 41       Deceased from metastasis   Cancer Maternal Grandmother    Brain cancer Maternal Grandmother    Brain cancer Paternal Grandmother    Colon cancer Paternal Grandfather    Allergies: Allergies  Allergen Reactions   Peanuts [Peanut Oil] Swelling   Health Maintenance: Health Maintenance  Topic Date Due   Hepatitis C Screening  Never done   PAP SMEAR-Modifier  04/03/2022   INFLUENZA VACCINE  12/03/2022 (Originally 04/04/2022)   COVID-19 Vaccine (1) 07/26/2026 (Originally 10/10/1981)   TETANUS/TDAP  10/15/2029   HIV Screening  Completed   HPV VACCINES  Aged Out    HM Colonoscopy     This patient has no relevant Health Maintenance data.      Medications: Current Outpatient Medications on File Prior to Visit  Medication Sig Dispense Refill   cyclobenzaprine (FLEXERIL) 10 MG tablet Take 0.5-1 tablets  (5-10 mg total) by mouth 3 (three) times daily as needed for muscle spasms. (Patient not taking: Reported on 07/10/2022) 30 tablet 0   No current facility-administered medications on file prior to visit.    Review of Systems: No headache, visual changes, nausea, vomiting, diarrhea, constipation, dizziness, abdominal pain, skin rash, fevers, chills, swollen lymph nodes, weight loss, chest pain, body aches, joint swelling, muscle aches, shortness of breath, mood changes, visual or auditory hallucinations reported.  Objective:   Vitals:   07/10/22 0809  BP: 124/78  Pulse: 80  SpO2: 99%   Vitals:   07/10/22 0809  Weight: (!) 350 lb (158.8 kg)  Height: 5\' 6"  (1.676 m)   Body mass index is 56.49 kg/m.  General: Well Developed, obese, well nourished, and in no acute distress.  Neuro: Alert and oriented x3, extra-ocular muscles intact, sensation grossly intact. Cranial nerves II through XII are grossly intact, motor, sensory, and coordinative functions are intact. HEENT: Normocephalic, atraumatic, pupils equal round reactive to light, neck supple, no masses, no lymphadenopathy, thyroid nonpalpable. Oropharynx, nasopharynx, external ear canals are unremarkable. Skin: Warm and dry, no rashes noted.  Cardiac: Regular rate and rhythm, no murmurs rubs or gallops. No peripheral edema. Pulses symmetric. Respiratory: Clear to auscultation bilaterally. Not using accessory muscles, speaking in full sentences.  Abdominal: Soft, nontender, nondistended, positive bowel sounds, no masses, no organomegaly. Musculoskeletal: Shoulder, elbow, wrist, hip, knee, ankle stable, and with full  range of motion.  Female chaperone initials: AS present throughout the physical examination.  Impression and Recommendations:   The patient was counselled, risk factors were discussed, and anticipatory guidance given.  Problem List Items Addressed This Visit       Cardiovascular and Mediastinum   Hypertension     Chronic, stable and improved over interval visit, risk stratification labs ordered.      Relevant Medications   amLODipine (NORVASC) 10 MG tablet   lisinopril (ZESTRIL) 20 MG tablet   Other Relevant Orders   CBC   Comprehensive metabolic panel   Lipid panel     Other   Hyperlipidemia    Repeat labs ordered today.      Relevant Medications   amLODipine (NORVASC) 10 MG tablet   lisinopril (ZESTRIL) 20 MG tablet   Other Relevant Orders   Comprehensive metabolic panel   Lipid panel   Anxiety and depression    Improvement on sertraline 50 mg, discussed titration options, is amenable to further titration to 100 mg daily.  Can continue nightly 50-100 mg trazodone as needed.      Relevant Medications   traZODone (DESYREL) 50 MG tablet   sertraline (ZOLOFT) 100 MG tablet   Other Relevant Orders   TSH   Other Visit Diagnoses     Annual physical exam    -  Primary   Relevant Orders   CBC   Comprehensive metabolic panel   Hepatitis C antibody   Lipid panel   TSH   VITAMIN D 25 Hydroxy (Vit-D Deficiency, Fractures)   Low serum vitamin D       Relevant Orders   VITAMIN D 25 Hydroxy (Vit-D Deficiency, Fractures)   Need for hepatitis C screening test       Relevant Orders   Hepatitis C antibody   Essential hypertension       Relevant Medications   amLODipine (NORVASC) 10 MG tablet   lisinopril (ZESTRIL) 20 MG tablet        Orders & Medications Medications:  Meds ordered this encounter  Medications   traZODone (DESYREL) 50 MG tablet    Sig: Take 1-2 tablets (50-100 mg total) by mouth at bedtime as needed for sleep.    Dispense:  60 tablet    Refill:  2   sertraline (ZOLOFT) 100 MG tablet    Sig: Take 1 tablet (100 mg total) by mouth daily.    Dispense:  90 tablet    Refill:  0   amLODipine (NORVASC) 10 MG tablet    Sig: Take 1 tablet (10 mg total) by mouth daily.    Dispense:  90 tablet    Refill:  0    Dosage increase   lisinopril (ZESTRIL) 20 MG tablet     Sig: Take 1 tablet (20 mg total) by mouth daily.    Dispense:  90 tablet    Refill:  0   Orders Placed This Encounter  Procedures   CBC   Comprehensive metabolic panel   Hepatitis C antibody   Lipid panel   TSH   VITAMIN D 25 Hydroxy (Vit-D Deficiency, Fractures)     Return in about 3 months (around 10/10/2022).    Jerrol Banana, MD   Primary Care Sports Medicine [facility]

## 2022-07-11 ENCOUNTER — Other Ambulatory Visit: Payer: Self-pay | Admitting: Family Medicine

## 2022-07-11 DIAGNOSIS — E785 Hyperlipidemia, unspecified: Secondary | ICD-10-CM

## 2022-07-11 DIAGNOSIS — R7989 Other specified abnormal findings of blood chemistry: Secondary | ICD-10-CM

## 2022-07-11 LAB — VITAMIN D 25 HYDROXY (VIT D DEFICIENCY, FRACTURES): Vit D, 25-Hydroxy: 9.1 ng/mL — ABNORMAL LOW (ref 30.0–100.0)

## 2022-07-11 LAB — CBC
Hematocrit: 35.1 % (ref 34.0–46.6)
Hemoglobin: 11.5 g/dL (ref 11.1–15.9)
MCH: 28.1 pg (ref 26.6–33.0)
MCHC: 32.8 g/dL (ref 31.5–35.7)
MCV: 86 fL (ref 79–97)
Platelets: 299 10*3/uL (ref 150–450)
RBC: 4.09 x10E6/uL (ref 3.77–5.28)
RDW: 14.2 % (ref 11.7–15.4)
WBC: 7.5 10*3/uL (ref 3.4–10.8)

## 2022-07-11 LAB — LIPID PANEL
Chol/HDL Ratio: 5.4 ratio — ABNORMAL HIGH (ref 0.0–4.4)
Cholesterol, Total: 216 mg/dL — ABNORMAL HIGH (ref 100–199)
HDL: 40 mg/dL (ref >39)
LDL Chol Calc (NIH): 161 mg/dL — ABNORMAL HIGH (ref 0–99)
Triglycerides: 85 mg/dL (ref 0–149)
VLDL Cholesterol Cal: 15 mg/dL (ref 5–40)

## 2022-07-11 LAB — COMPREHENSIVE METABOLIC PANEL
ALT: 17 IU/L (ref 0–32)
AST: 22 IU/L (ref 0–40)
Albumin/Globulin Ratio: 1.7 (ref 1.2–2.2)
Albumin: 4.3 g/dL (ref 3.9–4.9)
Alkaline Phosphatase: 63 IU/L (ref 44–121)
BUN/Creatinine Ratio: 13 (ref 9–23)
BUN: 8 mg/dL (ref 6–24)
Bilirubin Total: 0.3 mg/dL (ref 0.0–1.2)
CO2: 22 mmol/L (ref 20–29)
Calcium: 9 mg/dL (ref 8.7–10.2)
Chloride: 104 mmol/L (ref 96–106)
Creatinine, Ser: 0.64 mg/dL (ref 0.57–1.00)
Globulin, Total: 2.6 g/dL (ref 1.5–4.5)
Glucose: 89 mg/dL (ref 70–99)
Potassium: 4.6 mmol/L (ref 3.5–5.2)
Sodium: 138 mmol/L (ref 134–144)
Total Protein: 6.9 g/dL (ref 6.0–8.5)
eGFR: 114 mL/min/{1.73_m2} (ref >59)

## 2022-07-11 LAB — HEPATITIS C ANTIBODY: Hep C Virus Ab: NONREACTIVE

## 2022-07-11 LAB — TSH: TSH: 0.853 u[IU]/mL (ref 0.450–4.500)

## 2022-07-11 MED ORDER — ROSUVASTATIN CALCIUM 5 MG PO TABS: 5.0000 mg | ORAL_TABLET | Freq: Every day | ORAL | 3 refills | Status: DC

## 2022-07-11 MED ORDER — VITAMIN D (ERGOCALCIFEROL) 1.25 MG (50000 UNIT) PO CAPS: 50000.0000 [IU] | ORAL_CAPSULE | ORAL | 0 refills | Status: DC

## 2022-07-17 ENCOUNTER — Encounter: Payer: Self-pay | Admitting: Medical

## 2022-07-17 ENCOUNTER — Ambulatory Visit: Payer: Medicaid Other | Attending: Medical | Admitting: Medical

## 2022-07-17 ENCOUNTER — Ambulatory Visit (INDEPENDENT_AMBULATORY_CARE_PROVIDER_SITE_OTHER): Payer: Medicaid Other

## 2022-07-17 VITALS — BP 123/77 | HR 73 | Ht 66.0 in | Wt 352.4 lb

## 2022-07-17 DIAGNOSIS — R002 Palpitations: Secondary | ICD-10-CM

## 2022-07-17 DIAGNOSIS — Z6841 Body Mass Index (BMI) 40.0 and over, adult: Secondary | ICD-10-CM | POA: Diagnosis not present

## 2022-07-17 DIAGNOSIS — I10 Essential (primary) hypertension: Secondary | ICD-10-CM | POA: Diagnosis not present

## 2022-07-17 MED ORDER — METOPROLOL TARTRATE 100 MG PO TABS: 100.0000 mg | ORAL_TABLET | Freq: Two times a day (BID) | ORAL | 1 refills | Status: DC

## 2022-07-17 NOTE — Progress Notes (Signed)
Cardiology Office Note:    Date:  07/17/2022   ID:  Ashley Herring, DOB 07-05-1981, MRN [id number]  PCP:  Jerrol Banana, MD  [facility] Cardiologist:  None  [facility] Electrophysiologist:  None   Referring MD: Doren Custard, FNP   Chief Complaint: 3 month follow-up  History of Present Illness:    Ashley Herring is a 41 y.o. female with a hx of cardiomegaly, palpitations, remote syncope, morbid obesity, preeclampsia during pregnancy, EF 55-60% by echo who presents for 3 month follow-up.     In 2021 she developed preeclampsia and her blood pressure was close to 190 mmHg.  She had mild headache but no other symptoms.  There was mild swelling in lower extremities.  She required multiple antihypertensive medications.  She had a chest x-ray that showed cardiomegaly and BNP was mildly elevated at 195.  She had an echocardiogram done that showed an EF of 55 to 60% with mildly dilated left ventricle and mild LVH, mild mitral regurgitation with no other significant abnormalities.  The patient's blood pressure improved after delivery and she was discharged home on nifedipine.  She did not have these issues with 2 previous pregnancies.   Seen 01/29/20 and reported palpitations and heart monitor was ordered. Heart monitor showed I short run of SVT, otherwise normal.    Seen 06/01/21 and reported palpitations. 2 week heart monitor was ordered. Heart monitor showed NSR with rare PACs and PVCs with no significant arrhythmia. Labetolol was stopped and Lopressor was started.    Seen 08/05/21 and she reported improved palpitations. Lopressor was increased to 100mg  BID.    Seen 08/15/21 and was doing OK, BP a little high. Amlodipine was increased to 10mg  daily.   Last seen 04/18/22 and was overall doing well. She reported occasional high blood pressures and she was started on Lisinopril.   Today, the patient reports she is doing well. She started on Sertraline and is overall feeling  better. BP is good today. She is staying active. She has occasional heart racing at night. They last for a minute. It occurs 3-4 times weekly.She denies associated symptoms. Apparently, the patient switched pharmacies, and is not taking Lopressor. BP is good today.   Past Medical History:  Diagnosis Date   Back pain    Eye inflammation    Miscarriage    Sciatica     Past Surgical History:  Procedure Laterality Date   COLONOSCOPY WITH PROPOFOL N/A 02/08/2021   Procedure: COLONOSCOPY WITH PROPOFOL;  Surgeon: 04/20/22, MD;  Location: [facility];  Service: Endoscopy;  Laterality: N/A;   HEMORRHOID SURGERY      Current Medications: Current Meds  Medication Sig   amLODipine (NORVASC) 10 MG tablet Take 1 tablet (10 mg total) by mouth daily.   cyclobenzaprine (FLEXERIL) 10 MG tablet Take 0.5-1 tablets (5-10 mg total) by mouth 3 (three) times daily as needed for muscle spasms.   lisinopril (ZESTRIL) 20 MG tablet Take 1 tablet (20 mg total) by mouth daily.   metoprolol tartrate (LOPRESSOR) 100 MG tablet Take 1 tablet (100 mg total) by mouth 2 (two) times daily.   rosuvastatin (CRESTOR) 5 MG tablet Take 1 tablet (5 mg total) by mouth daily.   sertraline (ZOLOFT) 100 MG tablet Take 1 tablet (100 mg total) by mouth daily.   traZODone (DESYREL) 50 MG tablet Take 1-2 tablets (50-100 mg total) by mouth at bedtime as needed for sleep.   Vitamin D, Ergocalciferol, (DRISDOL) 1.25 MG (50000 UNIT) CAPS capsule  Take 1 capsule (50,000 Units total) by mouth every 7 (seven) days. Take for 8 total doses(weeks)     Allergies:   Peanuts [peanut oil]   Social History   Socioeconomic History   Marital status: Married    Spouse name: Dennard Nip   Number of children: 2   Years of education: Not on file   Highest education level: Not on file  Occupational History   Not on file  Tobacco Use   Smoking status: Some Days    Types: Cigarettes   Smokeless tobacco: Never   Tobacco comments:    Smoked one  cigarette occassionally  Vaping Use   Vaping Use: Never used  Substance and Sexual Activity   Alcohol use: Yes    Comment: occasionally    Drug use: Never   Sexual activity: Yes    Partners: Male    Birth control/protection: None  Other Topics Concern   Not on file  Social History Narrative   Not on file   Social Determinants of Health   Financial Resource Strain: Low Risk  (01/31/2019)   Overall Financial Resource Strain (CARDIA)    Difficulty of Paying Living Expenses: Not hard at all  Food Insecurity: No Food Insecurity (05/29/2022)   Hunger Vital Sign    Worried About Running Out of Food in the Last Year: Never true    Ran Out of Food in the Last Year: Never true  Transportation Needs: No Transportation Needs (05/29/2022)   PRAPARE - Administrator, Civil Service (Medical): No    Lack of Transportation (Non-Medical): No  Physical Activity: Sufficiently Active (01/31/2019)   Exercise Vital Sign    Days of Exercise per Week: 7 days    Minutes of Exercise per Session: 40 min  Stress: Stress Concern Present (01/31/2019)   [employer] of Occupational Health - Occupational Stress Questionnaire    Feeling of Stress : To some extent  Social Connections: Socially Integrated (01/31/2019)   Social Connection and Isolation Panel [NHANES]    Frequency of Communication with Friends and Family: More than three times a week    Frequency of Social Gatherings with Friends and Family: More than three times a week    Attends Religious Services: 1 to 4 times per year    Active Member of [employer] or Organizations: Not on file    Attends Banker Meetings: 1 to 4 times per year    Marital Status: Married     Family History: The patient's family history includes Asthma in her daughter; Brain cancer in her maternal grandmother and paternal grandmother; Breast cancer (age of onset: 71) in her paternal aunt; Cancer in her maternal grandmother; Colon cancer in her paternal  grandfather; Diabetes in her father and mother; Hypertension in her father and mother; Thyroid disease in her mother.  ROS:   Please see the history of present illness.     All other systems reviewed and are negative.  EKGs/Labs/Other Studies Reviewed:    The following studies were reviewed today:  Heart monitor 06/2021 Patient had a min HR of 52 bpm, max HR of 129 bpm, and avg HR of 78 bpm.  Predominant underlying rhythm was Sinus Rhythm.  Rare PACs and rare PVCs. No significant arrhythmia.   Echo 06/2021  1. Challenging image quality   2. Left ventricular ejection fraction, by estimation, is 60 to 65%. The  left ventricle has normal function. The left ventricle has no regional  wall motion abnormalities. Left ventricular  diastolic parameters are  consistent with Grade II diastolic  dysfunction (pseudonormalization).   3. Right ventricular systolic function is normal. The right ventricular  size is normal. There is normal pulmonary artery systolic pressure. The  estimated right ventricular systolic pressure is 34.4 mmHg.   4. The mitral valve was not well visualized. Mild mitral valve  regurgitation. No evidence of mitral stenosis.   EKG:  EKG is not ordered today.    Recent Labs: 07/10/2022: ALT 17; BUN 8; Creatinine, Ser 0.64; Hemoglobin 11.5; Platelets 299; Potassium 4.6; Sodium 138; TSH 0.853  Recent Lipid Panel    Component Value Date/Time   CHOL 216 (H) 07/10/2022 0959   TRIG 85 07/10/2022 0959   HDL 40 07/10/2022 0959   CHOLHDL 5.4 (H) 07/10/2022 0959   CHOLHDL 5.4 (H) 01/31/2019 1059   LDLCALC 161 (H) 07/10/2022 0959   LDLCALC 156 (H) 01/31/2019 1059    Physical Exam:    VS:  BP 123/77 (BP Location: Right Arm, Patient Position: Sitting, Cuff Size: Large) Comment (BP Location): forearm  Pulse 73   Ht 5\' 6"  (1.676 m)   Wt (!) 352 lb 6.4 oz (159.8 kg)   SpO2 98%   BMI 56.88 kg/m     Wt Readings from Last 3 Encounters:  07/17/22 (!) 352 lb 6.4 oz (159.8 kg)   07/10/22 (!) 350 lb (158.8 kg)  06/15/22 (!) 354 lb 6.4 oz (160.8 kg)     GEN:  Well nourished, well developed in no acute distress HEENT: Normal NECK: No JVD; No carotid bruits LYMPHATICS: No lymphadenopathy CARDIAC: RRR, no murmurs, rubs, gallops RESPIRATORY:  Clear to auscultation without rales, wheezing or rhonchi  ABDOMEN: Soft, non-tender, non-distended MUSCULOSKELETAL:  No edema; No deformity  SKIN: Warm and dry NEUROLOGIC:  Alert and oriented x 3 PSYCHIATRIC:  Normal affect   ASSESSMENT:    1. Essential hypertension   2. Palpitations   3. Class 3 severe obesity due to excess calories with body mass index (BMI) of 50.0 to 59.9 in adult, unspecified whether serious comorbidity present (HCC)    PLAN:    In order of problems listed above:  HTN BP is good today. Continue Lisinopril and amlodipine. Restart Lopressor (See blow).  Palpitations Patient switched pharmacies and somehow Lopressor fell off her medication list. She reports palpitations at night that last 1 minute. Prior heart monitor in 2022 showed NSR with PACs and PVCs. I will repeat a 2 week heart monitor and restart Lopressor 100mg  BID.   Obesity Patient is staying active and working on lifestyle changes.   Disposition: Follow up in 2 month(s) with MD/APP    Signed, Ayvah Caroll 2023, PA-C  07/17/2022 10:40 AM    [facility]

## 2022-07-17 NOTE — Patient Instructions (Addendum)
Medication Instructions:  RESTART metoprolol tartrate (Lopressor) 100 mg by mouth twice daily.  *If you need a refill on your cardiac medications before your next appointment, please call your pharmacy*   Lab Work: None ordered  If you have labs (blood work) drawn today and your tests are completely normal, you will receive your results only by: MyChart Message (if you have MyChart) OR A paper copy in the mail If you have any lab test that is abnormal or we need to change your treatment, we will call you to review the results.   Testing/Procedures: Your physician has recommended that you wear a Zio monitor.   This monitor is a medical device that records the heart's electrical activity. Doctors most often use these monitors to diagnose arrhythmias. Arrhythmias are problems with the speed or rhythm of the heartbeat. The monitor is a small device applied to your chest. You can wear one while you do your normal daily activities. While wearing this monitor if you have any symptoms to push the button and record what you felt. Once you have worn this monitor for the period of time provider prescribed (Usually 14 days), you will return the monitor device in the postage paid box. Once it is returned they will download the data collected and provide [place] with a report which the provider will then review and we will call you with those results. Important tips:  Avoid showering during the first 24 hours of wearing the monitor. Avoid excessive sweating to help maximize wear time. Do not submerge the device, no hot tubs, and no swimming pools. Keep any lotions or oils away from the patch. After 24 hours you may shower with the patch on. Take brief showers with your back facing the shower head.  Do not remove patch once it has been placed because that will interrupt data and decrease adhesive wear time. Push the button when you have any symptoms and write down what you were feeling. Once you have  completed wearing your monitor, remove and place into box which has postage paid and place in your outgoing mailbox.  If for some reason you have misplaced your box then call our office and we can provide another box and/or mail it off for you.  Follow-Up: At [facility], you and your health needs are our priority.  As part of our continuing mission to provide you with exceptional heart care, we have created designated Provider Care Teams.  These Care Teams include your primary Cardiologist (physician) and Advanced Practice Providers (APPs -  Physician Assistants and Nurse Practitioners) who all work together to provide you with the care you need, when you need it.  We recommend signing up for the patient portal called "MyChart".  Sign up information is provided on this After Visit Summary.  MyChart is used to connect with patients for Virtual Visits (Telemedicine).  Patients are able to view lab/test results, encounter notes, upcoming appointments, etc.  Non-urgent messages can be sent to your provider as well.   To learn more about what you can do with MyChart, go to [email or web address].    Your next appointment:   2 month(s)  The format for your next appointment:   In Person  Provider:   You may see Dr. Lorine Bears or one of the following Advanced Practice Providers on your designated Care Team:   Nicolasa Ducking, NP Eula Listen, PA-C Cadence Fransico Michael, PA-C Charlsie Quest, NP   Important Information About Sugar

## 2022-07-19 DIAGNOSIS — R002 Palpitations: Secondary | ICD-10-CM

## 2022-08-03 ENCOUNTER — Other Ambulatory Visit: Payer: Self-pay | Admitting: Family Medicine

## 2022-08-03 ENCOUNTER — Ambulatory Visit: Payer: Medicaid Other | Admitting: Dietician

## 2022-08-03 DIAGNOSIS — M5442 Lumbago with sciatica, left side: Secondary | ICD-10-CM

## 2022-08-07 ENCOUNTER — Encounter: Payer: Self-pay | Admitting: Family Medicine

## 2022-08-07 NOTE — Telephone Encounter (Signed)
Please review.  KP

## 2022-08-14 NOTE — Telephone Encounter (Signed)
Please call pt to schedule an appt.  KP

## 2022-08-15 ENCOUNTER — Telehealth (INDEPENDENT_AMBULATORY_CARE_PROVIDER_SITE_OTHER): Payer: Medicaid Other | Admitting: Family Medicine

## 2022-08-15 DIAGNOSIS — F5104 Psychophysiologic insomnia: Secondary | ICD-10-CM | POA: Diagnosis not present

## 2022-08-15 DIAGNOSIS — F32A Depression, unspecified: Secondary | ICD-10-CM | POA: Diagnosis not present

## 2022-08-15 DIAGNOSIS — F419 Anxiety disorder, unspecified: Secondary | ICD-10-CM

## 2022-08-15 MED ORDER — TRAZODONE HCL 50 MG PO TABS: 50.0000 mg | ORAL_TABLET | Freq: Every evening | ORAL | 0 refills | Status: DC | PRN

## 2022-08-15 MED ORDER — AMITRIPTYLINE HCL 50 MG PO TABS: ORAL_TABLET | ORAL | 0 refills | Status: DC

## 2022-08-15 NOTE — Assessment & Plan Note (Signed)
Chronic condition, interim worsening in the setting of increased life stressors (school, house, loss of loved ones). See additional assessment(s) for plan details.

## 2022-08-15 NOTE — Progress Notes (Signed)
Primary Care / Sports Medicine Virtual Visit  Patient Information:  Patient ID: Ashley Herring, female DOB: Nov 27, 1980 Age: 41 y.o. MRN: [id number]   Ashley Herring is a pleasant 41 y.o. female presenting with the following:  Chief Complaint  Patient presents with   Insomnia    Wants to up her trazadone   Back Pain    States she has muscle spasms at times and has been taking muscle relaxer as needed and would like a refill    Review of Systems: No fevers, chills, night sweats, weight loss, chest pain, or shortness of breath.   Patient Active Problem List   Diagnosis Date Noted   Psychophysiologic insomnia 08/15/2022   Anxiety and depression 05/29/2022   Hypertension 05/29/2022   Change in bowel habits    Noninfectious diarrhea    Anemia affecting pregnancy 10/05/2019   Nipple problem 07/10/2019   BMI 50.0-59.9, adult (HCC) 04/24/2019   Hyperlipidemia 04/04/2019   Elevated erythrocyte sedimentation rate 03/25/2019   Scleritis of left eye 03/25/2019   Spondylosis of lumbar joint 03/25/2019   Breast lump 01/31/2019   DDD (degenerative disc disease), lumbar 01/31/2019   Chronic left-sided low back pain with left-sided sciatica 01/31/2019   Extreme obesity 01/31/2019   Past Medical History:  Diagnosis Date   Back pain    Eye inflammation    Miscarriage    Sciatica    Outpatient Encounter Medications as of 08/15/2022  Medication Sig   amitriptyline (ELAVIL) 50 MG tablet One half tab PO qHS for a week, then one tab PO qHS.   amLODipine (NORVASC) 10 MG tablet Take 1 tablet (10 mg total) by mouth daily.   cyclobenzaprine (FLEXERIL) 10 MG tablet Take 10 mg by mouth 3 (three) times daily as needed for muscle spasms.   lisinopril (ZESTRIL) 20 MG tablet Take 1 tablet (20 mg total) by mouth daily.   metoprolol tartrate (LOPRESSOR) 100 MG tablet Take 1 tablet (100 mg total) by mouth 2 (two) times daily.   rosuvastatin (CRESTOR) 5 MG tablet Take 1 tablet (5 mg total)  by mouth daily.   sertraline (ZOLOFT) 100 MG tablet Take 1 tablet (100 mg total) by mouth daily.   Vitamin D, Ergocalciferol, (DRISDOL) 1.25 MG (50000 UNIT) CAPS capsule Take 1 capsule (50,000 Units total) by mouth every 7 (seven) days. Take for 8 total doses(weeks)   [DISCONTINUED] cyclobenzaprine (FLEXERIL) 10 MG tablet Take 0.5-1 tablets (5-10 mg total) by mouth 3 (three) times daily as needed for muscle spasms.   [DISCONTINUED] traZODone (DESYREL) 50 MG tablet Take 1-2 tablets (50-100 mg total) by mouth at bedtime as needed for sleep.   traZODone (DESYREL) 50 MG tablet Take 1-4 tablets (50-200 mg total) by mouth at bedtime as needed for sleep.   No facility-administered encounter medications on file as of 08/15/2022.   Past Surgical History:  Procedure Laterality Date   COLONOSCOPY WITH PROPOFOL N/A 02/08/2021   Procedure: COLONOSCOPY WITH PROPOFOL;  Surgeon: Midge Minium, MD;  [facility] END[facility]COPY;  Service: Endoscopy;  Laterality: N/A;   HEMORRHOID SURGERY      Virtual Visit via MyChart Video:   I connected with Ashley Herring on 08/15/22 via MyChart Video and verified that I am speaking with the correct person using appropriate identifiers.   The limitations, risks, security and privacy concerns of performing an evaluation and management service by MyChart Video, including the higher likelihood of inaccurate diagnoses and treatments, and the availability of in person appointments were reviewed. The possible  need of an additional face-to-face encounter for complete and high quality delivery of care was discussed. The patient was also made aware that there may be a patient responsible charge related to this service. The patient expressed understanding and wishes to proceed.  Provider location is in medical facility. Patient location is in their car parked, different from provider location. People involved in care of the patient during this telehealth encounter were myself, my  nurse/medical assistant, and my front office/scheduling team member.  Objective findings:   General: Speaking full sentences, no audible heavy breathing. Sounds alert and appropriately interactive. Well-appearing. Face symmetric. Extraocular movements intact. Pupils equal and round. No nasal flaring or accessory muscle use visualized.  Independent interpretation of notes and tests performed by another provider:   None  Pertinent History, Exam, Impression, and Recommendations:   Anxiety and depression Tolerating sertraline 100 mg without adverse effects, that being said has had significant life stressors over the interim with associated worsening insomnia.  Will hold from further titration of sertraline, initiate adjunct amitriptyline 25 mg x 1 week then 50 mg thereafter nightly.  Can continue trazodone as needed nightly with titration guidelines.  She does have upcoming visit with psychiatry for ADHD management, did encourage patient to discuss depression and anxiety with their group as well.  Psychophysiologic insomnia Chronic condition, interim worsening in the setting of increased life stressors (school, house, loss of loved ones). See additional assessment(s) for plan details.  Orders & Medications Meds ordered this encounter  Medications   amitriptyline (ELAVIL) 50 MG tablet    Sig: One half tab PO qHS for a week, then one tab PO qHS.    Dispense:  90 tablet    Refill:  0   traZODone (DESYREL) 50 MG tablet    Sig: Take 1-4 tablets (50-200 mg total) by mouth at bedtime as needed for sleep.    Dispense:  90 tablet    Refill:  0   No orders of the defined types were placed in this encounter.    I discussed the above assessment and treatment plan with the patient. The patient was provided an opportunity to ask questions and all were answered. The patient agreed with the plan and demonstrated an understanding of the instructions.   The patient was advised to call back or seek an  in-person evaluation if the symptoms worsen or if the condition fails to improve as anticipated.   I provided a total time of 30 minutes including both face-to-face and non-face-to-face time on 08/15/2022 inclusive of time utilized for medical chart review, information gathering, care coordination with staff, and documentation completion.    Jerrol Banana, MD, [facility]   Primary Care Sports Medicine Primary Care and Sports Medicine at [facility]

## 2022-08-15 NOTE — Assessment & Plan Note (Signed)
Tolerating sertraline 100 mg without adverse effects, that being said has had significant life stressors over the interim with associated worsening insomnia.  Will hold from further titration of sertraline, initiate adjunct amitriptyline 25 mg x 1 week then 50 mg thereafter nightly.  Can continue trazodone as needed nightly with titration guidelines.  She does have upcoming visit with psychiatry for ADHD management, did encourage patient to discuss depression and anxiety with their group as well.

## 2022-08-15 NOTE — Patient Instructions (Signed)
-   Continue sertraline 100 mg daily - Start new medication amitriptyline nightly as follows: Take 1/2 tablet amitriptyline x 1 week nightly at bedtime After 1 week, take 1 tablet amitriptyline nightly at bedtime - Can use trazodone, 1-4 tablets nightly as needed - Given your wake up time, aim to get in bed by 10 PM - Review information attached regarding sleep hygiene - Keep visit as scheduled, contact for any questions/concerns between now and then

## 2022-08-30 ENCOUNTER — Telehealth: Payer: Self-pay | Admitting: Cardiovascular Disease

## 2022-08-30 NOTE — Telephone Encounter (Signed)
Pt returning nurses call regarding results. Call transferred 

## 2022-08-30 NOTE — Telephone Encounter (Signed)
Pt made aware of monitor results and verbalized understanding.

## 2022-09-02 ENCOUNTER — Other Ambulatory Visit: Payer: Self-pay | Admitting: Family Medicine

## 2022-09-02 DIAGNOSIS — R7989 Other specified abnormal findings of blood chemistry: Secondary | ICD-10-CM

## 2022-09-03 NOTE — Telephone Encounter (Signed)
Requested medication (s) are due for refill today: review  Requested medication (s) are on the active medication list: yes  Last refill:  07/11/22 #8/0  Future visit scheduled: yes  Notes to clinic:  not delegated, was for 8 weeks, is pt suppose to continue after or change dose?     Requested Prescriptions  Pending Prescriptions Disp Refills   Vitamin D, Ergocalciferol, (DRISDOL) 1.25 MG (50000 UNIT) CAPS capsule [Pharmacy Med Name: VITAMIN D2 50,000IU (ERGO) CAP RX] 8 capsule 0    Sig: TAKE ONE CAPSULE BY MOUTH EVERY 7 DAYS FOR TOTAL OF 8 DOSES(WEEKS)     Endocrinology:  Vitamins - Vitamin D Supplementation 2 Failed - 09/02/2022 10:12 AM      Failed - Manual Review: Route requests for 50,000 IU strength to the provider      Failed - Vitamin D in normal range and within 360 days    Vit D, 25-Hydroxy  Date Value Ref Range Status  07/10/2022 9.1 (L) 30.0 - 100.0 ng/mL Final    Comment:    Vitamin D deficiency has been defined by the Institute of Medicine and an Endocrine Society practice guideline as a level of serum 25-OH vitamin D less than 20 ng/mL (1,2). The Endocrine Society went on to further define vitamin D insufficiency as a level between 21 and 29 ng/mL (2). 1. IOM (Institute of Medicine). 2010. Dietary reference    intakes for calcium and D. [place]: The    [employer]. 2. Holick MF, Binkley [place], Bischoff-Ferrari HA, et al.    Evaluation, treatment, and prevention of vitamin D    deficiency: an Endocrine Society clinical practice    guideline. JCEM. 2011 Jul; 96(7):1911-30.          Passed - Ca in normal range and within 360 days    Calcium  Date Value Ref Range Status  07/10/2022 9.0 8.7 - 10.2 mg/dL Final   Calcium, Total  Date Value Ref Range Status  06/26/2013 8.8 8.5 - 10.1 mg/dL Final         Passed - Valid encounter within last 12 months    Recent Outpatient Visits           2 weeks ago Anxiety and depression   [place]  Primary Care and Sports Medicine at [facility], Ocie Bob, MD   1 month ago Annual physical exam   [facility] at [facility], Ocie Bob, MD   3 months ago Anxiety and depression   [place] Primary Care and Sports Medicine at [facility], Ocie Bob, MD   2 years ago Pruritus   [facility] Danelle Berry, PA-C   3 years ago Mixed hyperlipidemia   [facility] Doren Custard, [place]       Future Appointments             In 1 week Fransico Michael, Cadence H, PA-C [facility] [place]   In 1 month Ashley Royalty, Ocie Bob, MD [facility] Primary Care and Sports Medicine at [facility], [facility]

## 2022-09-14 ENCOUNTER — Encounter: Payer: Self-pay | Admitting: Dietician

## 2022-09-14 NOTE — Progress Notes (Signed)
Have not heard back from patient to reschedule her cancelled appointment from 08/03/22; sent notification to referring provider.

## 2022-09-15 ENCOUNTER — Other Ambulatory Visit: Payer: Self-pay

## 2022-09-15 ENCOUNTER — Telehealth: Payer: Self-pay | Admitting: Family Medicine

## 2022-09-15 ENCOUNTER — Ambulatory Visit: Payer: Medicaid Other | Admitting: Medical

## 2022-09-15 DIAGNOSIS — N63 Unspecified lump in unspecified breast: Secondary | ICD-10-CM

## 2022-09-15 NOTE — Telephone Encounter (Signed)
PC to pt, referral placed.

## 2022-09-15 NOTE — Telephone Encounter (Signed)
Pt is calling because on her previous mamogram she was advised to schedule a bioposy. Pt is requesting a referral for a biopsy. CB- [phone]

## 2022-09-15 NOTE — Progress Notes (Deleted)
Cardiology Office Note:    Date:  09/15/2022   ID:  Ashley Herring, DOB 10-07-1980, MRN [id number]  PCP:  Montel Culver, MD  [facility] Cardiologist:  None  [facility] Electrophysiologist:  None   Referring MD: Montel Culver, MD   Chief Complaint: 2 month follow-up  History of Present Illness:    Ashley Herring is a 42 y.o. female with a hx of cardiomegaly, palpitations, remote syncope, morbid obesity, preeclampsia during pregnancy, EF 55-60% by echo who presents for 3 month follow-up.     In 2021 she developed preeclampsia and her blood pressure was close to 190 mmHg.  She had mild headache but no other symptoms.  There was mild swelling in lower extremities.  She required multiple antihypertensive medications.  She had a chest x-ray that showed cardiomegaly and BNP was mildly elevated at 195.  She had an echocardiogram done that showed an EF of 55 to 60% with mildly dilated left ventricle and mild LVH, mild mitral regurgitation with no other significant abnormalities.  The patient's blood pressure improved after delivery and she was discharged home on nifedipine.  She did not have these issues with 2 previous pregnancies.   Seen 01/29/20 and reported palpitations and heart monitor was ordered. Heart monitor showed I short run of SVT, otherwise normal.    Seen 06/01/21 and reported palpitations. 2 week heart monitor was ordered. Heart monitor showed NSR with rare PACs and PVCs with no significant arrhythmia. Labetolol was stopped and Lopressor was started.   Last seen 07/17/22 and was overall doing well. BP was good. She reported occasional fast heart rate, she reported she stopped lopressor. Lopressor was restarted and a heart monitor was ordered.   Today,   Past Medical History:  Diagnosis Date   Back pain    Eye inflammation    Miscarriage    Sciatica     Past Surgical History:  Procedure Laterality Date   COLONOSCOPY WITH PROPOFOL N/A 02/08/2021    Procedure: COLONOSCOPY WITH PROPOFOL;  Surgeon: Lucilla Lame, MD;  Location: [facility] ENDOSCOPY;  Service: Endoscopy;  Laterality: N/A;   HEMORRHOID SURGERY      Current Medications: No outpatient medications have been marked as taking for the 09/15/22 encounter (Appointment) with Kathlen Mody, Zekiah Coen H, PA-C.     Allergies:   Peanuts [peanut oil]   Social History   Socioeconomic History   Marital status: Married    Spouse name: Cornelia Copa   Number of children: 2   Years of education: Not on file   Highest education level: Not on file  Occupational History   Not on file  Tobacco Use   Smoking status: Some Days    Types: Cigarettes   Smokeless tobacco: Never   Tobacco comments:    Smoked one cigarette occassionally  Vaping Use   Vaping Use: Never used  Substance and Sexual Activity   Alcohol use: Yes    Comment: occasionally    Drug use: Never   Sexual activity: Yes    Partners: Male    Birth control/protection: None  Other Topics Concern   Not on file  Social History Narrative   Not on file   Social Determinants of Health   Financial Resource Strain: Low Risk  (01/31/2019)   Overall Financial Resource Strain (CARDIA)    Difficulty of Paying Living Expenses: Not hard at all  Food Insecurity: No Food Insecurity (05/29/2022)   Hunger Vital Sign    Worried About Running Out of Food in the  Last Year: Never true    [place] in the Last Year: Never true  Transportation Needs: No Transportation Needs (05/29/2022)   PRAPARE - Hydrologist (Medical): No    Lack of Transportation (Non-Medical): No  Physical Activity: Sufficiently Active (01/31/2019)   Exercise Vital Sign    Days of Exercise per Week: 7 days    Minutes of Exercise per Session: 40 min  Stress: Stress Concern Present (01/31/2019)   [place]    Feeling of Stress : To some extent  Social Connections: Socially Integrated  (01/31/2019)   Social Connection and Isolation Panel [NHANES]    Frequency of Communication with Friends and Family: More than three times a week    Frequency of Social Gatherings with Friends and Family: More than three times a week    Attends Religious Services: 1 to 4 times per year    Active Member of [employer] or Organizations: Not on file    Attends Archivist Meetings: 1 to 4 times per year    Marital Status: Married     Family History: The patient's family history includes Asthma in her daughter; Brain cancer in her maternal grandmother and paternal grandmother; Breast cancer (age of onset: 63) in her paternal aunt; Cancer in her maternal grandmother; Colon cancer in her paternal grandfather; Diabetes in her father and mother; Hypertension in her father and mother; Thyroid disease in her mother.  ROS:   Please see the history of present illness.     All other systems reviewed and are negative.  EKGs/Labs/Other Studies Reviewed:    The following studies were reviewed today: ***  EKG:  EKG is *** ordered today.  The ekg ordered today demonstrates ***  Recent Labs: 07/10/2022: ALT 17; BUN 8; Creatinine, Ser 0.64; Hemoglobin 11.5; Platelets 299; Potassium 4.6; Sodium 138; TSH 0.853  Recent Lipid Panel    Component Value Date/Time   CHOL 216 (H) 07/10/2022 0959   TRIG 85 07/10/2022 0959   HDL 40 07/10/2022 0959   CHOLHDL 5.4 (H) 07/10/2022 0959   CHOLHDL 5.4 (H) 01/31/2019 1059   LDLCALC 161 (H) 07/10/2022 0959   LDLCALC 156 (H) 01/31/2019 1059     Risk Assessment/Calculations:   {Does this patient have ATRIAL FIBRILLATION?:[phone]}   Physical Exam:    VS:  There were no vitals taken for this visit.    Wt Readings from Last 3 Encounters:  07/17/22 (!) 352 lb 6.4 oz (159.8 kg)  07/10/22 (!) 350 lb (158.8 kg)  06/15/22 (!) 354 lb 6.4 oz (160.8 kg)     GEN: *** Well nourished, well developed in no acute distress HEENT: Normal NECK: No JVD; No carotid  bruits LYMPHATICS: No lymphadenopathy CARDIAC: ***RRR, no murmurs, rubs, gallops RESPIRATORY:  Clear to auscultation without rales, wheezing or rhonchi  ABDOMEN: Soft, non-tender, non-distended MUSCULOSKELETAL:  No edema; No deformity  SKIN: Warm and dry NEUROLOGIC:  Alert and oriented x 3 PSYCHIATRIC:  Normal affect   ASSESSMENT:    No diagnosis found. PLAN:    In order of problems listed above:  ***  Disposition: Follow up {follow up:15908} with ***   Shared Decision Making/Informed Consent   {Are you ordering a CV Procedure (e.g. stress test, cath, DCCV, TEE, etc)?   Press F2        :[id number]}    Signed, Reyansh Kushnir Ninfa Meeker, PA-C  09/15/2022 1:06 PM    [facility]

## 2022-09-25 ENCOUNTER — Ambulatory Visit: Payer: Medicaid Other | Admitting: Medical

## 2022-10-02 ENCOUNTER — Ambulatory Visit: Payer: Medicaid Other | Attending: Medical | Admitting: Medical

## 2022-10-02 ENCOUNTER — Encounter: Payer: Self-pay | Admitting: Medical

## 2022-10-02 ENCOUNTER — Ambulatory Visit: Payer: Medicaid Other | Admitting: Medical

## 2022-10-02 VITALS — BP 140/60 | HR 97 | Ht 66.0 in | Wt 353.5 lb

## 2022-10-02 DIAGNOSIS — Z6841 Body Mass Index (BMI) 40.0 and over, adult: Secondary | ICD-10-CM

## 2022-10-02 DIAGNOSIS — R002 Palpitations: Secondary | ICD-10-CM | POA: Diagnosis not present

## 2022-10-02 DIAGNOSIS — I10 Essential (primary) hypertension: Secondary | ICD-10-CM | POA: Diagnosis not present

## 2022-10-02 NOTE — Patient Instructions (Signed)
Medication Instructions:  No changes *If you need a refill on your cardiac medications before your next appointment, please call your pharmacy*   Lab Work: None ordered If you have labs (blood work) drawn today and your tests are completely normal, you will receive your results only by: [place] (if you have MyChart) OR A paper copy in the mail If you have any lab test that is abnormal or we need to change your treatment, we will call you to review the results.   Testing/Procedures: None ordered   Follow-Up: At [facility], you and your health needs are our priority.  As part of our continuing mission to provide you with exceptional heart care, we have created designated Provider Care Teams.  These Care Teams include your primary Cardiologist (physician) and Advanced Practice Providers (APPs -  Physician Assistants and Nurse Practitioners) who all work together to provide you with the care you need, when you need it.  We recommend signing up for the patient portal called "MyChart".  Sign up information is provided on this After Visit Summary.  MyChart is used to connect with patients for Virtual Visits (Telemedicine).  Patients are able to view lab/test results, encounter notes, upcoming appointments, etc.  Non-urgent messages can be sent to your provider as well.   To learn more about what you can do with MyChart, go to [email or web address].    Your next appointment:   6 month(s)  Provider:   Tarri Glenn, PA-C

## 2022-10-02 NOTE — Progress Notes (Signed)
Cardiology Office Note:    Date:  10/02/2022   ID:  Ashley, Herring 05-May-1981, MRN [id number]  PCP:  Ashley Culver, MD  [facility] Cardiologist:  None  [facility] Electrophysiologist:  None   Referring MD: Ashley Culver, MD   Chief Complaint: 3 month follow-up  History of Present Illness:    Ashley Herring is a 42 y.o. female with a hx of cardiomegaly, palpitations, remote syncope, morbid obesity, preeclampsia during pregnancy, EF 55-60% by echo who presents for 3 month follow-up.     In 2021 she developed preeclampsia and her blood pressure was close to 190 mmHg.  She had mild headache but no other symptoms.  There was mild swelling in lower extremities.  She required multiple antihypertensive medications.  She had a chest x-ray that showed cardiomegaly and BNP was mildly elevated at 195.  She had an echocardiogram done that showed an EF of 55 to 60% with mildly dilated left ventricle and mild LVH, mild mitral regurgitation with no other significant abnormalities.  The patient's blood pressure improved after delivery and she was discharged home on nifedipine.  She did not have these issues with 2 previous pregnancies.   Seen 01/29/20 and reported palpitations and heart monitor was ordered. Heart monitor showed I short run of SVT, otherwise normal.    Seen 06/01/21 and reported palpitations. 2 week heart monitor was ordered. Heart monitor showed NSR with rare PACs and PVCs with no significant arrhythmia. Labetolol was stopped and Lopressor was started.   Last seen 07/2022 and was overall doing well. She was feeling better on sertraline.  She reports palpitations, however she had not noticed she was off Lopressor after a pharmacy change. Lopressor was restarted and a heart monitor was ordered.  Heart monitor showed NSR, 1 run of wide complex tachycardia lasting 6 beats. This could be VT vs SVT with aberrancy.   Today, the patient reports weight is up and down.  She is still eating healthy and exercising. She has only had palpitations twice since restarting Lopressor. BP is mildly elevated. She is having nerve pain in the arms, which I recommended she see PCP for if it gets worse. No LLE, orthopnea or pnd.   Past Medical History:  Diagnosis Date   Back pain    Eye inflammation    Miscarriage    Sciatica     Past Surgical History:  Procedure Laterality Date   COLONOSCOPY WITH PROPOFOL N/A 02/08/2021   Procedure: COLONOSCOPY WITH PROPOFOL;  Surgeon: Lucilla Lame, MD;  Location: [facility] ENDOSCOPY;  Service: Endoscopy;  Laterality: N/A;   HEMORRHOID SURGERY      Current Medications: Current Meds  Medication Sig   amitriptyline (ELAVIL) 50 MG tablet One half tab PO qHS for a week, then one tab PO qHS.   amLODipine (NORVASC) 10 MG tablet Take 1 tablet (10 mg total) by mouth daily.   cyclobenzaprine (FLEXERIL) 10 MG tablet Take 10 mg by mouth 3 (three) times daily as needed for muscle spasms.   lisinopril (ZESTRIL) 20 MG tablet Take 1 tablet (20 mg total) by mouth daily.   metoprolol tartrate (LOPRESSOR) 100 MG tablet Take 1 tablet (100 mg total) by mouth 2 (two) times daily.   rosuvastatin (CRESTOR) 5 MG tablet Take 1 tablet (5 mg total) by mouth daily.   sertraline (ZOLOFT) 100 MG tablet Take 1 tablet (100 mg total) by mouth daily.   traZODone (DESYREL) 50 MG tablet Take 1-4 tablets (50-200 mg total) by mouth  at bedtime as needed for sleep.     Allergies:   Peanuts [peanut oil]   Social History   Socioeconomic History   Marital status: Married    Spouse name: Cornelia Copa   Number of children: 2   Years of education: Not on file   Highest education level: Not on file  Occupational History   Not on file  Tobacco Use   Smoking status: Some Days    Types: Cigarettes   Smokeless tobacco: Never   Tobacco comments:    Smoked one cigarette occassionally  Vaping Use   Vaping Use: Never used  Substance and Sexual Activity   Alcohol use: Yes     Comment: occasionally    Drug use: Never   Sexual activity: Yes    Partners: Male    Birth control/protection: None  Other Topics Concern   Not on file  Social History Narrative   Not on file   Social Determinants of Health   Financial Resource Strain: Low Risk  (01/31/2019)   Overall Financial Resource Strain (CARDIA)    Difficulty of Paying Living Expenses: Not hard at all  Food Insecurity: No Food Insecurity (05/29/2022)   Hunger Vital Sign    Worried About Running Out of Food in the Last Year: Never true    Ran Out of Food in the Last Year: Never true  Transportation Needs: No Transportation Needs (05/29/2022)   PRAPARE - Hydrologist (Medical): No    Lack of Transportation (Non-Medical): No  Physical Activity: Sufficiently Active (01/31/2019)   Exercise Vital Sign    Days of Exercise per Week: 7 days    Minutes of Exercise per Session: 40 min  Stress: Stress Concern Present (01/31/2019)   [place]    Feeling of Stress : To some extent  Social Connections: Socially Integrated (01/31/2019)   Social Connection and Isolation Panel [NHANES]    Frequency of Communication with Friends and Family: More than three times a week    Frequency of Social Gatherings with Friends and Family: More than three times a week    Attends Religious Services: 1 to 4 times per year    Active Member of [employer] or Organizations: Not on file    Attends Archivist Meetings: 1 to 4 times per year    Marital Status: Married     Family History: The patient's family history includes Asthma in her daughter; Brain cancer in her maternal grandmother and paternal grandmother; Breast cancer (age of onset: 22) in her paternal aunt; Cancer in her maternal grandmother; Colon cancer in her paternal grandfather; Diabetes in her father and mother; Hypertension in her father and mother; Thyroid disease in her  mother.  ROS:   Please see the history of present illness.     All other systems reviewed and are negative.  EKGs/Labs/Other Studies Reviewed:    The following studies were reviewed today:  Heart monitor 08/2022   Patient had a min HR of 49 bpm, max HR of 184 bpm, and avg HR of 76 bpm. Predominant underlying rhythm was Sinus Rhythm.  1 run of wide-complex tachycardia  lasting 6 beats with a max rate of 184 bpm (avg 165 bpm).  This could be due to ventricular tachycardia versus SVT with aberrancy Rare PACs and rare PVCs. Most triggered events did not correlate with arrhythmia.  Heart monitor 06/2021 Patient had a min HR of 52 bpm, max HR of  129 bpm, and avg HR of 78 bpm.  Predominant underlying rhythm was Sinus Rhythm.  Rare PACs and rare PVCs. No significant arrhythmia.   Echo 06/2021  1. Challenging image quality   2. Left ventricular ejection fraction, by estimation, is 60 to 65%. The  left ventricle has normal function. The left ventricle has no regional  wall motion abnormalities. Left ventricular diastolic parameters are  consistent with Grade II diastolic  dysfunction (pseudonormalization).   3. Right ventricular systolic function is normal. The right ventricular  size is normal. There is normal pulmonary artery systolic pressure. The  estimated right ventricular systolic pressure is 34.4 mmHg.   4. The mitral valve was not well visualized. Mild mitral valve  regurgitation. No evidence of mitral stenosis.     EKG:  EKG is ordered today.  The ekg ordered today demonstrates NSR 97bpm, nonspecific T wave changes  Recent Labs: 07/10/2022: ALT 17; BUN 8; Creatinine, Ser 0.64; Hemoglobin 11.5; Platelets 299; Potassium 4.6; Sodium 138; TSH 0.853  Recent Lipid Panel    Component Value Date/Time   CHOL 216 (H) 07/10/2022 0959   TRIG 85 07/10/2022 0959   HDL 40 07/10/2022 0959   CHOLHDL 5.4 (H) 07/10/2022 0959   CHOLHDL 5.4 (H) 01/31/2019 1059   LDLCALC 161 (H) 07/10/2022  0959   LDLCALC 156 (H) 01/31/2019 1059    Physical Exam:    VS:  BP (!) 140/60 (BP Location: Left Wrist, Patient Position: Sitting, Cuff Size: Large)   Pulse 97   Ht 5\' 6"  (1.676 m)   Wt (!) 353 lb 8 oz (160.3 kg)   SpO2 98%   BMI 57.06 kg/m     Wt Readings from Last 3 Encounters:  10/02/22 (!) 353 lb 8 oz (160.3 kg)  07/17/22 (!) 352 lb 6.4 oz (159.8 kg)  07/10/22 (!) 350 lb (158.8 kg)     GEN:  Well nourished, well developed in no acute distress HEENT: Normal NECK: No JVD; No carotid bruits LYMPHATICS: No lymphadenopathy CARDIAC: RRR, no murmurs, rubs, gallops RESPIRATORY:  Clear to auscultation without rales, wheezing or rhonchi  ABDOMEN: Soft, non-tender, non-distended MUSCULOSKELETAL:  No edema; No deformity  SKIN: Warm and dry NEUROLOGIC:  Alert and oriented x 3 PSYCHIATRIC:  Normal affect   ASSESSMENT:    1. Palpitations   2. Essential hypertension   3. BMI 50.0-59.9, adult (HCC)    PLAN:    In order of problems listed above:  Palpitations Heart monitor showed NSR with 1 run of NSVT vs SVT with aberrancy, rare PCS/PVCs. She reports overall improved symptoms on Lopressor 100mg  BID. Echo from 2022 showed LVEF 60-65%, no WMA, G2DD, normal RVSF, mild MR. No further work-up at this time.   HTN BP is mildly elevated. We will leave medications for now and continue with lifestyle changes. Continue Amlodipine, Lisinopril and Lopressor.   Obesity Patient is working on lifestyle changes.   Disposition: Follow up in 6 month(s) with MD/APP    Signed, Teyana Pierron , PA-C  10/02/2022 9:48 AM    [facility]

## 2022-10-09 ENCOUNTER — Ambulatory Visit (INDEPENDENT_AMBULATORY_CARE_PROVIDER_SITE_OTHER): Payer: Medicaid Other | Admitting: Family Medicine

## 2022-10-09 ENCOUNTER — Encounter: Payer: Self-pay | Admitting: Family Medicine

## 2022-10-09 DIAGNOSIS — F419 Anxiety disorder, unspecified: Secondary | ICD-10-CM

## 2022-10-09 DIAGNOSIS — F32A Depression, unspecified: Secondary | ICD-10-CM | POA: Diagnosis not present

## 2022-10-09 DIAGNOSIS — F5104 Psychophysiologic insomnia: Secondary | ICD-10-CM

## 2022-10-09 MED ORDER — SERTRALINE HCL 100 MG PO TABS: 200.0000 mg | ORAL_TABLET | Freq: Every day | ORAL | 0 refills | Status: DC

## 2022-10-09 MED ORDER — AMITRIPTYLINE HCL 100 MG PO TABS: 100.0000 mg | ORAL_TABLET | Freq: Every day | ORAL | 0 refills | Status: DC

## 2022-10-09 MED ORDER — TRAZODONE HCL 100 MG PO TABS: 200.0000 mg | ORAL_TABLET | Freq: Every evening | ORAL | 0 refills | Status: DC | PRN

## 2022-10-09 NOTE — Patient Instructions (Signed)
-   Start new dose of sertraline - Start new dose of amitriptyline - Read Rx instructions on new dose of trazodone - Talk to GYN about breast lump and contraception (IUD, etc) - Follow with behavioral health and seek visit with psychiatry - Referral coordinator will contact you to schedule visit with sleep medicine - Return for wrist and foot evaluation, perform exercises between now and then - Contact us 1-2 days prior for pain medication if considering injections

## 2022-10-09 NOTE — Assessment & Plan Note (Signed)
In the setting of suboptimally controlled anxiety / depression. Confirmed current dose patient is using nightly with modest response, refilled.

## 2022-10-09 NOTE — Assessment & Plan Note (Signed)
Suboptimal control on chronic issue, lengthy discussion on treatment options. She has established with behavioral health and encouraged patient to pursue psychiatry input for medication management. Over the interim, will titrate sertraline to 200 mg daily.

## 2022-10-09 NOTE — Progress Notes (Signed)
Primary Care / Sports Medicine Office Visit  Patient Information:  Patient ID: Ashley Herring, female DOB: August 01, 1981 Age: 42 y.o. MRN: [id number]   Ashley Herring is a pleasant 42 y.o. female presenting with the following:  Chief Complaint  Patient presents with   Anxiety and depression    Vitals:   10/09/22 0904  BP: 130/82  Pulse: (!) 115  SpO2: 99%   Vitals:   10/09/22 0904  Weight: (!) 355 lb 6.4 oz (161.2 kg)  Height: 5\' 6"  (1.676 m)   Body mass index is 57.36 kg/m.  No results found.   Independent interpretation of notes and tests performed by another provider:   None  Procedures performed:   None  Pertinent History, Exam, Impression, and Recommendations:   Ashley Herring was seen today for anxiety and depression.  Anxiety and depression Overview:    10/09/2022    9:09 AM 08/15/2022    7:55 AM 07/10/2022    8:10 AM 06/15/2022    3:19 PM 05/29/2022    3:18 PM  Depression screen PHQ 2/9  Decreased Interest 0 0 0 0 2  Down, Depressed, Hopeless 0 2 0 0 2  PHQ - 2 Score 0 2 0 0 4  Altered sleeping 3 2 1  3   Tired, decreased energy 2 2 1  3   Change in appetite 3 1 1   0  Feeling bad or failure about yourself  0 0 2  0  Trouble concentrating 2 1 2  3   Moving slowly or fidgety/restless 3 0 0  2  Suicidal thoughts 0 0 0  0  PHQ-9 Score 13 8 7  15   Difficult doing work/chores Very difficult Somewhat difficult Not difficult at all  Extremely dIfficult      10/09/2022    9:10 AM 08/15/2022    7:56 AM 07/10/2022    8:12 AM 05/29/2022    3:20 PM  GAD 7 : Generalized Anxiety Score  Nervous, Anxious, on Edge 2 0 1 3  Control/stop worrying 0 2 2 2   Worry too much - different things 1 1 1 3   Trouble relaxing 2 0 1 3  Restless 3 2 2 2   Easily annoyed or irritable 3 0 2 3  Afraid - awful might happen 0 0 1 3  Total GAD 7 Score 11 5 10 19   Anxiety Difficulty Very difficult Somewhat difficult Somewhat difficult Extremely difficult      Assessment &  Plan: Suboptimal control on chronic issue, lengthy discussion on treatment options. She has established with behavioral health and encouraged patient to pursue psychiatry input for medication management. Over the interim, will titrate sertraline to 200 mg daily.  Orders: -     traZODone HCl; Take 2 tablets (200 mg total) by mouth at bedtime as needed for sleep.  Dispense: 180 tablet; Refill: 0 -     Amitriptyline HCl; Take 1 tablet (100 mg total) by mouth at bedtime.  Dispense: 90 tablet; Refill: 0 -     Sertraline HCl; Take 2 tablets (200 mg total) by mouth daily.  Dispense: 180 tablet; Refill: 0  Psychophysiologic insomnia Assessment & Plan: In the setting of suboptimally controlled anxiety / depression. Confirmed current dose patient is using nightly with modest response, refilled.  Orders: -     traZODone HCl; Take 2 tablets (200 mg total) by mouth at bedtime as needed for sleep.  Dispense: 180 tablet; Refill: 0   I provided a total time of 45 minutes including  both face-to-face and non-face-to-face time on 10/09/2022 inclusive of time utilized for medical chart review, information gathering, care coordination with staff, and documentation completion.   Orders & Medications Meds ordered this encounter  Medications   traZODone (DESYREL) 100 MG tablet    Sig: Take 2 tablets (200 mg total) by mouth at bedtime as needed for sleep.    Dispense:  180 tablet    Refill:  0   amitriptyline (ELAVIL) 100 MG tablet    Sig: Take 1 tablet (100 mg total) by mouth at bedtime.    Dispense:  90 tablet    Refill:  0   sertraline (ZOLOFT) 100 MG tablet    Sig: Take 2 tablets (200 mg total) by mouth daily.    Dispense:  180 tablet    Refill:  0   No orders of the defined types were placed in this encounter.    No follow-ups on file.     Montel Culver, MD, [facility]   Primary Care Sports Medicine Primary Care and Sports Medicine at [facility]

## 2022-10-10 ENCOUNTER — Ambulatory Visit: Payer: Medicaid Other | Admitting: Family Medicine

## 2022-10-16 ENCOUNTER — Other Ambulatory Visit: Payer: Self-pay | Admitting: Family Medicine

## 2022-10-16 DIAGNOSIS — I10 Essential (primary) hypertension: Secondary | ICD-10-CM

## 2022-10-16 NOTE — Progress Notes (Unsigned)
Ashley Culver, MD   No chief complaint on file.   HPI:      [name] is a 42 y.o. [id number] whose LMP was No LMP recorded., presents today for menopausal symptoms    Patient Active Problem List   Diagnosis Date Noted   Psychophysiologic insomnia 08/15/2022   Anxiety and depression 05/29/2022   Hypertension 05/29/2022   Change in bowel habits    Noninfectious diarrhea    Anemia affecting pregnancy 10/05/2019   Nipple problem 07/10/2019   BMI 50.0-59.9, adult ([place]) 04/24/2019   Hyperlipidemia 04/04/2019   Elevated erythrocyte sedimentation rate 03/25/2019   Scleritis of left eye 03/25/2019   Spondylosis of lumbar joint 03/25/2019   Breast lump 01/31/2019   DDD (degenerative disc disease), lumbar 01/31/2019   Chronic left-sided low back pain with left-sided sciatica 01/31/2019   Extreme obesity 01/31/2019    Past Surgical History:  Procedure Laterality Date   COLONOSCOPY WITH PROPOFOL N/A 02/08/2021   Procedure: COLONOSCOPY WITH PROPOFOL;  Surgeon: Lucilla Lame, MD;  Location: [facility] ENDOSCOPY;  Service: Endoscopy;  Laterality: N/A;   HEMORRHOID SURGERY      Family History  Problem Relation Age of Onset   Hypertension Mother    Diabetes Mother    Thyroid disease Mother    Hypertension Father    Diabetes Father    Asthma Daughter    Breast cancer Paternal Aunt 47       Deceased from metastasis   Cancer Maternal Grandmother    Brain cancer Maternal Grandmother    Brain cancer Paternal Grandmother    Colon cancer Paternal Grandfather     Social History   Socioeconomic History   Marital status: Married    Spouse name: Cornelia Copa   Number of children: 2   Years of education: Not on file   Highest education level: Not on file  Occupational History   Not on file  Tobacco Use   Smoking status: Some Days    Types: Cigarettes   Smokeless tobacco: Never   Tobacco comments:    Smoked one cigarette occassionally  Vaping Use   Vaping Use: Never  used  Substance and Sexual Activity   Alcohol use: Yes    Comment: occasionally    Drug use: Never   Sexual activity: Yes    Partners: Male    Birth control/protection: None  Other Topics Concern   Not on file  Social History Narrative   Not on file   Social Determinants of Health   Financial Resource Strain: Low Risk  (01/31/2019)   Overall Financial Resource Strain (CARDIA)    Difficulty of Paying Living Expenses: Not hard at all  Food Insecurity: No Food Insecurity (05/29/2022)   Hunger Vital Sign    Worried About Running Out of Food in the Last Year: Never true    Ran Out of Food in the Last Year: Never true  Transportation Needs: No Transportation Needs (05/29/2022)   PRAPARE - Hydrologist (Medical): No    Lack of Transportation (Non-Medical): No  Physical Activity: Sufficiently Active (01/31/2019)   Exercise Vital Sign    Days of Exercise per Week: 7 days    Minutes of Exercise per Session: 40 min  Stress: Stress Concern Present (01/31/2019)   [place]    Feeling of Stress : To some extent  Social Connections: Socially Integrated (01/31/2019)   Social Connection and Isolation Panel [NHANES]  Frequency of Communication with Friends and Family: More than three times a week    Frequency of Social Gatherings with Friends and Family: More than three times a week    Attends Religious Services: 1 to 4 times per year    Active Member of [employer] or Organizations: Not on file    Attends Archivist Meetings: 1 to 4 times per year    Marital Status: Married  Human resources officer Violence: Not At Risk (05/29/2022)   Humiliation, Afraid, Rape, and Kick questionnaire    Fear of Current or Ex-Partner: No    Emotionally Abused: No    Physically Abused: No    Sexually Abused: No    Outpatient Medications Prior to Visit  Medication Sig Dispense Refill   amitriptyline (ELAVIL) 100 MG  tablet Take 1 tablet (100 mg total) by mouth at bedtime. 90 tablet 0   amLODipine (NORVASC) 10 MG tablet Take 1 tablet (10 mg total) by mouth daily. 90 tablet 0   lisinopril (ZESTRIL) 20 MG tablet Take 1 tablet (20 mg total) by mouth daily. 90 tablet 0   metoprolol tartrate (LOPRESSOR) 100 MG tablet Take 1 tablet (100 mg total) by mouth 2 (two) times daily. 60 tablet 1   rosuvastatin (CRESTOR) 5 MG tablet Take 1 tablet (5 mg total) by mouth daily. 90 tablet 3   sertraline (ZOLOFT) 100 MG tablet Take 2 tablets (200 mg total) by mouth daily. 180 tablet 0   traZODone (DESYREL) 100 MG tablet Take 2 tablets (200 mg total) by mouth at bedtime as needed for sleep. 180 tablet 0   No facility-administered medications prior to visit.      ROS:  Review of Systems BREAST: No symptoms   OBJECTIVE:   Vitals:  There were no vitals taken for this visit.  Physical Exam  Results: No results found for this or any previous visit (from the past 24 hour(s)).   Assessment/Plan: No diagnosis found.    No orders of the defined types were placed in this encounter.     No follow-ups on file.  Remie Mathison B. Shyne Resch, PA-C 10/16/2022 7:36 PM

## 2022-10-17 ENCOUNTER — Other Ambulatory Visit (HOSPITAL_COMMUNITY)
Admission: RE | Admit: 2022-10-17 | Discharge: 2022-10-17 | Disposition: A | Discharge status: Home / Self Care | Payer: Medicaid Other | Source: Ambulatory Visit | Attending: Obstetrics and Gynecology | Admitting: Obstetrics and Gynecology

## 2022-10-17 ENCOUNTER — Encounter: Payer: Self-pay | Admitting: Obstetrics and Gynecology

## 2022-10-17 ENCOUNTER — Ambulatory Visit (INDEPENDENT_AMBULATORY_CARE_PROVIDER_SITE_OTHER): Payer: Medicaid Other | Admitting: Obstetrics and Gynecology

## 2022-10-17 VITALS — BP 141/83 | HR 88 | Ht 66.0 in | Wt 358.0 lb

## 2022-10-17 DIAGNOSIS — N912 Amenorrhea, unspecified: Secondary | ICD-10-CM | POA: Diagnosis not present

## 2022-10-17 DIAGNOSIS — R61 Generalized hyperhidrosis: Secondary | ICD-10-CM

## 2022-10-17 DIAGNOSIS — Z1151 Encounter for screening for human papillomavirus (HPV): Secondary | ICD-10-CM | POA: Insufficient documentation

## 2022-10-17 DIAGNOSIS — Z124 Encounter for screening for malignant neoplasm of cervix: Secondary | ICD-10-CM | POA: Insufficient documentation

## 2022-10-17 DIAGNOSIS — Z1231 Encounter for screening mammogram for malignant neoplasm of breast: Secondary | ICD-10-CM

## 2022-10-17 DIAGNOSIS — Z6841 Body Mass Index (BMI) 40.0 and over, adult: Secondary | ICD-10-CM

## 2022-10-17 DIAGNOSIS — R7303 Prediabetes: Secondary | ICD-10-CM

## 2022-10-17 DIAGNOSIS — K047 Periapical abscess without sinus: Secondary | ICD-10-CM

## 2022-10-17 DIAGNOSIS — Z3202 Encounter for pregnancy test, result negative: Secondary | ICD-10-CM | POA: Diagnosis not present

## 2022-10-17 DIAGNOSIS — N926 Irregular menstruation, unspecified: Secondary | ICD-10-CM

## 2022-10-17 LAB — POCT URINE PREGNANCY: Preg Test, Ur: NEGATIVE

## 2022-10-17 MED ORDER — AMOXICILLIN 500 MG PO CAPS: 500.0000 mg | ORAL_CAPSULE | Freq: Three times a day (TID) | ORAL | 0 refills | Status: DC

## 2022-10-17 NOTE — Telephone Encounter (Signed)
Requested Prescriptions  Pending Prescriptions Disp Refills   lisinopril (ZESTRIL) 20 MG tablet [Pharmacy Med Name: LISINOPRIL 20MG TABLETS] 90 tablet 1    Sig: TAKE 1 TABLET(20 MG) BY MOUTH DAILY     Cardiovascular:  ACE Inhibitors Passed - 10/16/2022  1:22 PM      Passed - Cr in normal range and within 180 days    Creat  Date Value Ref Range Status  05/14/2020 0.65 0.50 - 1.10 mg/dL Final   Creatinine, Ser  Date Value Ref Range Status  07/10/2022 0.64 0.57 - 1.00 mg/dL Final   Creatinine, Urine  Date Value Ref Range Status  12/05/2019 270 mg/dL Final         Passed - K in normal range and within 180 days    Potassium  Date Value Ref Range Status  07/10/2022 4.6 3.5 - 5.2 mmol/L Final  06/26/2013 3.8 3.5 - 5.1 mmol/L Final         Passed - Patient is not pregnant      Passed - Last BP in normal range    BP Readings from Last 1 Encounters:  10/09/22 130/82         Passed - Valid encounter within last 6 months    Recent Outpatient Visits           1 week ago Anxiety and depression   [place] [facility] [place], Earley Abide, MD   2 months ago Anxiety and depression   [place] [facility] [place] [facility] [place], Earley Abide, MD   3 months ago Annual physical exam   [place] [facility] [place], Earley Abide, MD   4 months ago Anxiety and depression   [facility] [place] [facility] [place], Earley Abide, MD   2 years ago Pruritus   [facility] Delsa Grana, PA-C               amLODipine (NORVASC) 10 MG tablet [Pharmacy Med Name: AMLODIPINE BESYLATE 10MG TABLETS] 90 tablet 1    Sig: TAKE 1 TABLET(10 MG) BY MOUTH DAILY     Cardiovascular: Calcium Channel Blockers 2 Failed - 10/16/2022  1:22 PM      Failed - Last Heart Rate in normal range    Pulse Readings from Last 1 Encounters:  10/09/22 (!) 115          Passed - Last BP in normal range    BP Readings from Last 1 Encounters:  10/09/22 130/82         Passed - Valid encounter within last 6 months    Recent Outpatient Visits           1 week ago Anxiety and depression   [place] [facility] at [place], Earley Abide, MD   2 months ago Anxiety and depression   [place] [facility] [place] [facility] [place], Earley Abide, MD   3 months ago Annual physical exam   [place] [facility] [place], Earley Abide, MD   4 months ago Anxiety and depression   [facility] [place] [facility] [place], Earley Abide, MD   2 years ago Pruritus   [facility] Delsa Grana, [place]

## 2022-10-18 LAB — HEMOGLOBIN A1C
Est. average glucose Bld gHb Est-mCnc: 123 mg/dL
Hgb A1c MFr Bld: 5.9 % — ABNORMAL HIGH (ref 4.8–5.6)

## 2022-10-19 LAB — CYTOLOGY - PAP
Adequacy: ABSENT
Comment: NEGATIVE
Diagnosis: NEGATIVE
High risk HPV: NEGATIVE

## 2022-10-27 ENCOUNTER — Encounter: Payer: Self-pay | Admitting: Family Medicine

## 2022-10-27 ENCOUNTER — Ambulatory Visit (INDEPENDENT_AMBULATORY_CARE_PROVIDER_SITE_OTHER): Payer: Medicaid Other | Admitting: Family Medicine

## 2022-10-27 ENCOUNTER — Ambulatory Visit
Admission: RE | Admit: 2022-10-27 | Discharge: 2022-10-27 | Disposition: A | Discharge status: Home / Self Care | Payer: Medicaid Other | Source: Ambulatory Visit | Attending: Family Medicine | Admitting: Family Medicine

## 2022-10-27 ENCOUNTER — Ambulatory Visit
Admission: RE | Admit: 2022-10-27 | Discharge: 2022-10-27 | Disposition: A | Discharge status: Home / Self Care | Payer: Medicaid Other | Attending: Family Medicine | Admitting: Family Medicine

## 2022-10-27 ENCOUNTER — Inpatient Hospital Stay: Admit: 2022-10-27 | Payer: Medicaid Other

## 2022-10-27 VITALS — BP 130/78 | HR 96 | Ht 66.0 in | Wt 364.0 lb

## 2022-10-27 DIAGNOSIS — M722 Plantar fascial fibromatosis: Secondary | ICD-10-CM

## 2022-10-27 DIAGNOSIS — F5104 Psychophysiologic insomnia: Secondary | ICD-10-CM

## 2022-10-27 DIAGNOSIS — E559 Vitamin D deficiency, unspecified: Secondary | ICD-10-CM | POA: Diagnosis not present

## 2022-10-27 DIAGNOSIS — F419 Anxiety disorder, unspecified: Secondary | ICD-10-CM

## 2022-10-27 DIAGNOSIS — G5601 Carpal tunnel syndrome, right upper limb: Secondary | ICD-10-CM | POA: Insufficient documentation

## 2022-10-27 DIAGNOSIS — I10 Essential (primary) hypertension: Secondary | ICD-10-CM | POA: Diagnosis not present

## 2022-10-27 DIAGNOSIS — Z6841 Body Mass Index (BMI) 40.0 and over, adult: Secondary | ICD-10-CM

## 2022-10-27 DIAGNOSIS — F32A Depression, unspecified: Secondary | ICD-10-CM

## 2022-10-27 HISTORY — DX: Plantar fascial fibromatosis: M72.2

## 2022-10-27 MED ORDER — DIAZEPAM 5 MG PO TABS: ORAL_TABLET | ORAL | 0 refills | Status: DC

## 2022-10-27 NOTE — Patient Instructions (Addendum)
-   Continue current medication regimen - Obtain labs and x-ray today - Take medication as prescribed prior to next visit - Contact numbers below for scheduling  Please call the following groups: [facility] Management: [phone]  [place] (ask for scheduling with Sleep Medicine): [phone]

## 2022-10-27 NOTE — Assessment & Plan Note (Signed)
Referral to weight management has been placed, information for patient to contact for further scheduling provided via MyChart.

## 2022-10-27 NOTE — Assessment & Plan Note (Signed)
Chronic, stable, interval improved, medication compliance discussed.

## 2022-10-27 NOTE — Assessment & Plan Note (Signed)
Chronic condition, ongoing symptomatology, did perform exercises since last visit, doses OTC medications for symptom control.  Given her persistent symptomatology I have advised x-rays and close follow-up with low threshold to advance to corticosteroid injections under ultrasound guidance.

## 2022-10-27 NOTE — Assessment & Plan Note (Signed)
Chronic condition, ongoing symptomatology, did start home exercises since last visit, continues to have pain.  Plan for dedicated x-rays and close follow-up for reevaluation with low threshold to advance to injections.

## 2022-10-27 NOTE — Assessment & Plan Note (Signed)
Referral to sleep medicine placed last visit, patient was provided with contact information to proceed with scheduling.

## 2022-10-27 NOTE — Assessment & Plan Note (Signed)
Was able to titrate sertraline and amitriptyline, dosing 200 mg trazodone nightly on as-needed basis, excellent interim response without adverse effects reported.  Will continue current regimen.

## 2022-10-27 NOTE — Progress Notes (Signed)
     Primary Care / Sports Medicine Office Visit  Patient Information:  Patient ID: Ashley Herring, female DOB: 10/16/1980 Age: 42 y.o. MRN: [id number]   Ashley Herring is a pleasant 42 y.o. female presenting with the following:  Chief Complaint  Patient presents with   Anxiety and depression    Vitals:   10/27/22 1422  BP: 130/78  Pulse: 96  SpO2: 99%   Vitals:   10/27/22 1422  Weight: (!) 364 lb (165.1 kg)  Height: 5' 6"$  (1.676 m)   Body mass index is 58.75 kg/m.  No results found.   Independent interpretation of notes and tests performed by another provider:   None  Procedures performed:   None  Pertinent History, Exam, Impression, and Recommendations:   Ashley Herring was seen today for anxiety and depression.  Plantar fasciitis, bilateral Assessment & Plan: Chronic condition, ongoing symptomatology, did perform exercises since last visit, doses OTC medications for symptom control.  Given her persistent symptomatology I have advised x-rays and close follow-up with low threshold to advance to corticosteroid injections under ultrasound guidance.  Orders: -     DG Foot Complete Right; Future -     DG Foot Complete Left; Future  Right carpal tunnel syndrome Assessment & Plan: Chronic condition, ongoing symptomatology, did start home exercises since last visit, continues to have pain.  Plan for dedicated x-rays and close follow-up for reevaluation with low threshold to advance to injections.  Orders: -     DG Wrist Complete Right; Future  Vitamin D deficiency -     VITAMIN D 25 Hydroxy (Vit-D Deficiency, Fractures)  Hypertension, unspecified type Assessment & Plan: Chronic, stable, interval improved, medication compliance discussed.   Anxiety and depression Assessment & Plan: Was able to titrate sertraline and amitriptyline, dosing 200 mg trazodone nightly on as-needed basis, excellent interim response without adverse effects reported.  Will  continue current regimen.   Psychophysiologic insomnia Assessment & Plan: Referral to sleep medicine placed last visit, patient was provided with contact information to proceed with scheduling.   BMI 50.0-59.9, adult [facility]) Assessment & Plan: Referral to weight management has been placed, information for patient to contact for further scheduling provided via [place].   Other orders -     diazePAM; 1 tab 2 hours before procedure or imaging, take another tab 30 minutes to 1 hour before if not feeling relaxed (do not drive on this med)  Dispense: 2 tablet; Refill: 0     Orders & Medications Meds ordered this encounter  Medications   diazepam (VALIUM) 5 MG tablet    Sig: 1 tab 2 hours before procedure or imaging, take another tab 30 minutes to 1 hour before if not feeling relaxed (do not drive on this med)    Dispense:  2 tablet    Refill:  0   Orders Placed This Encounter  Procedures   DG Foot Complete Right   DG Foot Complete Left   DG Wrist Complete Right   VITAMIN D 25 Hydroxy (Vit-D Deficiency, Fractures)     No follow-ups on file.     Montel Culver, MD, [facility]   Primary Care Sports Medicine Primary Care and Sports Medicine at [facility]

## 2022-10-28 LAB — VITAMIN D 25 HYDROXY (VIT D DEFICIENCY, FRACTURES): Vit D, 25-Hydroxy: 10.7 ng/mL — ABNORMAL LOW (ref 30.0–100.0)

## 2022-10-31 ENCOUNTER — Ambulatory Visit: Payer: Medicaid Other | Admitting: Family Medicine

## 2022-11-03 ENCOUNTER — Other Ambulatory Visit: Payer: Self-pay | Admitting: Family Medicine

## 2022-11-03 MED ORDER — VITAMIN D (ERGOCALCIFEROL) 1.25 MG (50000 UNIT) PO CAPS: 50000.0000 [IU] | ORAL_CAPSULE | ORAL | 0 refills | Status: DC

## 2022-11-22 ENCOUNTER — Institutional Professional Consult (permissible substitution): Payer: Medicaid Other | Admitting: Internal Medicine

## 2022-11-28 ENCOUNTER — Ambulatory Visit (INDEPENDENT_AMBULATORY_CARE_PROVIDER_SITE_OTHER): Payer: Medicaid Other | Admitting: Family Medicine

## 2022-11-28 ENCOUNTER — Encounter: Payer: Self-pay | Admitting: Family Medicine

## 2022-11-28 VITALS — BP 126/78 | HR 97 | Ht 66.0 in | Wt 364.0 lb

## 2022-11-28 DIAGNOSIS — J0110 Acute frontal sinusitis, unspecified: Secondary | ICD-10-CM

## 2022-11-28 MED ORDER — PROMETHAZINE-DM 6.25-15 MG/5ML PO SYRP: 5.0000 mL | ORAL_SOLUTION | Freq: Four times a day (QID) | ORAL | 0 refills | Status: DC | PRN

## 2022-11-28 MED ORDER — AMOXICILLIN-POT CLAVULANATE 875-125 MG PO TABS: 1.0000 | ORAL_TABLET | Freq: Two times a day (BID) | ORAL | 0 refills | Status: DC

## 2022-12-02 ENCOUNTER — Other Ambulatory Visit: Payer: Self-pay | Admitting: Family Medicine

## 2022-12-02 DIAGNOSIS — F419 Anxiety disorder, unspecified: Secondary | ICD-10-CM

## 2022-12-04 ENCOUNTER — Encounter: Payer: Self-pay | Admitting: Family Medicine

## 2022-12-04 ENCOUNTER — Other Ambulatory Visit (INDEPENDENT_AMBULATORY_CARE_PROVIDER_SITE_OTHER): Payer: Medicaid Other | Admitting: Radiology

## 2022-12-04 ENCOUNTER — Ambulatory Visit (INDEPENDENT_AMBULATORY_CARE_PROVIDER_SITE_OTHER): Payer: Medicaid Other | Admitting: Family Medicine

## 2022-12-04 VITALS — BP 138/96 | HR 85 | Wt 350.0 lb

## 2022-12-04 DIAGNOSIS — G5601 Carpal tunnel syndrome, right upper limb: Secondary | ICD-10-CM

## 2022-12-04 DIAGNOSIS — M722 Plantar fascial fibromatosis: Secondary | ICD-10-CM

## 2022-12-04 MED ORDER — GABAPENTIN 100 MG PO CAPS
100.0000 mg | ORAL_CAPSULE | Freq: Every evening | ORAL | 0 refills | Status: DC | PRN
Start: 2022-12-04 — End: 2023-10-10

## 2022-12-04 MED ORDER — TRIAMCINOLONE ACETONIDE 40 MG/ML IJ SUSP
120.0000 mg | Freq: Once | INTRAMUSCULAR | Status: AC
Start: 2022-12-04 — End: 2022-12-04
  Administered 2022-12-04: 120 mg via INTRAMUSCULAR

## 2022-12-04 NOTE — Assessment & Plan Note (Signed)
Right-hand-dominant patient presents for follow-up to right longstanding carpal tunnel syndrome.  Has continued to perform home exercises, OTC regimens.  X-rays reassuring.  Also brings up whole arm intermittent paresthesias raising concern for cervical etiology.  In an effort to diagnose and treat, plan as follows:  - Ultrasound-guided carpal tunnel injection - Close monitoring for symptom change - If still symptomatic at the 2-week mark she is to start gabapentin 100 mg nightly as needed and contact us for next steps - Next steps include cervical spine x-rays, modification of medications, and office visit

## 2022-12-04 NOTE — Assessment & Plan Note (Signed)
Chronic, ongoing symptomatology despite OTC treatments, home exercises.  We discussed additional treatment strategies and she like to proceed with bilateral ultrasound-guided plantar fascia injections at the calcaneus.  Plan as follows: -Bilateral cortisone injections - Home exercises - Contact us at 2 weeks or later for any persistent symptoms noted

## 2022-12-04 NOTE — Progress Notes (Signed)
Primary Care / Sports Medicine Office Visit  Patient Information:  Patient ID: Ashley Herring, female DOB: [birth year] Age: 42 y.o. MRN: [id number]   Ashley Herring is a pleasant 42 y.o. female presenting with the following:  Chief Complaint  Patient presents with   Wrist Pain   Foot Injury    Vitals:   12/04/22 1440 12/04/22 1443  BP: (!) 140/100 (!) 138/96  Pulse: 85   SpO2: 99%    Vitals:   12/04/22 1440  Weight: (!) 350 lb (158.8 kg)   Body mass index is 56.49 kg/m.  No results found.   Independent interpretation of notes and tests performed by another provider:   None  Procedures performed:   Procedure:  Injection of right carpal tunnel under ultrasound guidance. Ultrasound guidance utilized for in-plane approach to median nerve, hypoechoic area around median nerve noted Samsung HS60 device utilized with permanent recording / reporting. Verbal informed consent obtained and verified. Skin prepped in a sterile fashion. Ethyl chloride for topical local analgesia.  Completed without difficulty and tolerated well. Medication: triamcinolone acetonide 40 mg/mL suspension for injection 1 mL total and 2 mL lidocaine 1% without epinephrine utilized for needle placement anesthetic Advised to contact for fevers/chills, erythema, induration, drainage, or persistent bleeding.  Procedure:  Injection of right plantar fascia at the calcaneus/origin under ultrasound guidance. Ultrasound guidance utilized for in-plane approach to right calcaneus at the plantar fascia origin Samsung HS60 device utilized with permanent recording / reporting. Verbal informed consent obtained and verified. Skin prepped in a sterile fashion. Ethyl chloride for topical local analgesia.  Completed without difficulty and tolerated well. Medication: triamcinolone acetonide 40 mg/mL suspension for injection 1 mL total and 2 mL lidocaine 1% without epinephrine utilized for needle placement  anesthetic Advised to contact for fevers/chills, erythema, induration, drainage, or persistent bleeding.  Procedure:  Injection of left plantar fascia at the calcaneus/origin under ultrasound guidance. Ultrasound guidance utilized for in-plane approach, medial to lateral to right calcaneus at plantar fascia origin Samsung HS60 device utilized with permanent recording / reporting. Verbal informed consent obtained and verified. Skin prepped in a sterile fashion. Ethyl chloride for topical local analgesia.  Completed without difficulty and tolerated well. Medication: triamcinolone acetonide 40 mg/mL suspension for injection 1 mL total and 2 mL lidocaine 1% without epinephrine utilized for needle placement anesthetic Advised to contact for fevers/chills, erythema, induration, drainage, or persistent bleeding.   Pertinent History, Exam, Impression, and Recommendations:   Ashley Herring was seen today for wrist pain and foot injury.  Plantar fasciitis, bilateral Assessment & Plan: Chronic, ongoing symptomatology despite OTC treatments, home exercises.  We discussed additional treatment strategies and she like to proceed with bilateral ultrasound-guided plantar fascia injections at the calcaneus.  Plan as follows: -Bilateral cortisone injections - Home exercises - Contact us at 2 weeks or later for any persistent symptoms noted  Orders: -     [place] LIMITED JOINT SPACE STRUCTURES LOW BILAT; Future -     Triamcinolone Acetonide  Right carpal tunnel syndrome Assessment & Plan: Right-hand-dominant patient presents for follow-up to right longstanding carpal tunnel syndrome.  Has continued to perform home exercises, OTC regimens.  X-rays reassuring.  Also brings up whole arm intermittent paresthesias raising concern for cervical etiology.  In an effort to diagnose and treat, plan as follows:  - Ultrasound-guided carpal tunnel injection - Close monitoring for symptom change - If still symptomatic at  the 2-week mark she is to start gabapentin 100 mg nightly as needed and  contact us for next steps - Next steps include cervical spine x-rays, modification of medications, and office visit  Orders: -     [place] LIMITED JOINT SPACE STRUCTURES UP RIGHT; Future -     Triamcinolone Acetonide -     Gabapentin; Take 1 capsule (100 mg total) by mouth at bedtime as needed.  Dispense: 30 capsule; Refill: 0     Orders & Medications Meds ordered this encounter  Medications   triamcinolone acetonide (KENALOG-40) injection 120 mg   gabapentin (NEURONTIN) 100 MG capsule    Sig: Take 1 capsule (100 mg total) by mouth at bedtime as needed.    Dispense:  30 capsule    Refill:  0   Orders Placed This Encounter  Procedures   [place] LIMITED JOINT SPACE STRUCTURES UP RIGHT   [place] LIMITED JOINT SPACE STRUCTURES LOW BILAT     No follow-ups on file.     Montel Culver, MD, [facility]   Primary Care Sports Medicine Primary Care and Sports Medicine at [facility]

## 2022-12-04 NOTE — Telephone Encounter (Signed)
Refused Zoloft because it has been discontinued due to a dose change.

## 2022-12-09 DIAGNOSIS — J0110 Acute frontal sinusitis, unspecified: Secondary | ICD-10-CM | POA: Insufficient documentation

## 2022-12-09 NOTE — Assessment & Plan Note (Signed)
Acute onset x 8 days, findings most consistent with left frontal sinusitis.  - Augmentin course - PRN promethazine

## 2022-12-09 NOTE — Progress Notes (Signed)
     Primary Care / Sports Medicine Office Visit  Patient Information:  Patient ID: Ashley Herring, female DOB: 1981/01/20 Age: 42 y.o. MRN: [id number]   Ashley Herring is a pleasant 42 y.o. female presenting with the following:  Chief Complaint  Patient presents with   Cough    Gagging for 8 days    Vitals:   11/28/22 1604  BP: 126/78  Pulse: 97  SpO2: 97%   Vitals:   11/28/22 1604  Weight: (!) 364 lb (165.1 kg)  Height: 5\' 6"  (1.676 m)   Body mass index is 58.75 kg/m.     Independent interpretation of notes and tests performed by another provider:   None  Procedures performed:   None  Pertinent History, Exam, Impression, and Recommendations:   Ashley Herring was seen today for cough.  Acute non-recurrent frontal sinusitis Assessment & Plan: Acute onset x 8 days, findings most consistent with left frontal sinusitis.  - Augmentin course - PRN promethazine   Other orders -     Amoxicillin-Pot Clavulanate; Take 1 tablet by mouth 2 (two) times daily.  Dispense: 20 tablet; Refill: 0 -     Promethazine-DM; Take 5 mLs by mouth 4 (four) times daily as needed for cough.  Dispense: 118 mL; Refill: 0     Orders & Medications Meds ordered this encounter  Medications   amoxicillin-clavulanate (AUGMENTIN) 875-125 MG tablet    Sig: Take 1 tablet by mouth 2 (two) times daily.    Dispense:  20 tablet    Refill:  0   promethazine-dextromethorphan (PROMETHAZINE-DM) 6.25-15 MG/5ML syrup    Sig: Take 5 mLs by mouth 4 (four) times daily as needed for cough.    Dispense:  118 mL    Refill:  0   No orders of the defined types were placed in this encounter.    No follow-ups on file.     Jerrol Banana, MD, [facility]   Primary Care Sports Medicine Primary Care and Sports Medicine at [facility]

## 2022-12-20 ENCOUNTER — Encounter: Payer: Self-pay | Admitting: Family Medicine

## 2022-12-21 NOTE — Telephone Encounter (Signed)
Please advise 

## 2023-01-01 ENCOUNTER — Other Ambulatory Visit: Payer: Self-pay | Admitting: Family Medicine

## 2023-01-01 DIAGNOSIS — F419 Anxiety disorder, unspecified: Secondary | ICD-10-CM

## 2023-01-02 NOTE — Telephone Encounter (Signed)
Requested Prescriptions  Pending Prescriptions Disp Refills   sertraline (ZOLOFT) 100 MG tablet [Pharmacy Med Name: SERTRALINE 100MG  TABLETS] 180 tablet 0    Sig: TAKE 2 TABLETS(200 MG) BY MOUTH DAILY     Psychiatry:  Antidepressants - SSRI - sertraline Passed - 01/01/2023  6:22 AM      Passed - AST in normal range and within 360 days    AST  Date Value Ref Range Status  07/10/2022 22 0 - 40 IU/L Final   SGOT(AST)  Date Value Ref Range Status  06/26/2013 18 15 - 37 Unit/L Final         Passed - ALT in normal range and within 360 days    ALT  Date Value Ref Range Status  07/10/2022 17 0 - 32 IU/L Final   SGPT (ALT)  Date Value Ref Range Status  06/26/2013 20 12 - 78 U/L Final         Passed - Completed PHQ-2 or PHQ-9 in the last 360 days      Passed - Valid encounter within last 6 months    Recent Outpatient Visits           4 weeks ago Plantar fasciitis, bilateral   [place] [facility] Ashley Royalty, Ocie Bob, MD   1 month ago Acute non-recurrent frontal sinusitis   [place] [facility], Ocie Bob, MD   2 months ago Plantar fasciitis, bilateral   [place] [facility], Ocie Bob, MD   2 months ago Anxiety and depression   [place] [facility], Ocie Bob, MD   4 months ago Anxiety and depression   [place] [facility], Ocie Bob, MD               amitriptyline (ELAVIL) 100 MG tablet [Pharmacy Med Name: AMITRIPTYLINE 100MG  (HUNDRED MG)TAB] 90 tablet 0    Sig: TAKE 1 TABLET(100 MG) BY MOUTH AT BEDTIME     Psychiatry:  Antidepressants - Heterocyclics (TCAs) Passed - 01/01/2023  6:22 AM      Passed - Completed PHQ-2 or PHQ-9 in the last 360 days      Passed - Valid encounter within last 6 months    Recent Outpatient Visits           4  weeks ago Plantar fasciitis, bilateral   [place] [facility], Ocie Bob, MD   1 month ago Acute non-recurrent frontal sinusitis   [place] [facility], Ocie Bob, MD   2 months ago Plantar fasciitis, bilateral   [place] [facility], Ocie Bob, MD   2 months ago Anxiety and depression   [facility] at [facility], Ocie Bob, MD   4 months ago Anxiety and depression   [facility] at [facility], Ocie Bob, MD

## 2023-01-17 ENCOUNTER — Institutional Professional Consult (permissible substitution): Payer: Medicaid Other | Admitting: Internal Medicine

## 2023-02-08 ENCOUNTER — Ambulatory Visit
Admission: RE | Admit: 2023-02-08 | Discharge: 2023-02-08 | Disposition: A | Discharge status: Home / Self Care | Payer: Medicaid Other | Source: Ambulatory Visit | Attending: Adult Health | Admitting: Adult Health

## 2023-02-08 ENCOUNTER — Encounter: Payer: Self-pay | Admitting: Adult Health

## 2023-02-08 ENCOUNTER — Ambulatory Visit (INDEPENDENT_AMBULATORY_CARE_PROVIDER_SITE_OTHER): Payer: Medicaid Other | Admitting: Adult Health

## 2023-02-08 VITALS — BP 128/76 | HR 77 | Temp 97.8°F | Ht 66.0 in | Wt 351.4 lb

## 2023-02-08 DIAGNOSIS — G4700 Insomnia, unspecified: Secondary | ICD-10-CM | POA: Diagnosis not present

## 2023-02-08 DIAGNOSIS — Z87891 Personal history of nicotine dependence: Secondary | ICD-10-CM | POA: Diagnosis not present

## 2023-02-08 DIAGNOSIS — R0602 Shortness of breath: Secondary | ICD-10-CM

## 2023-02-08 DIAGNOSIS — R0683 Snoring: Secondary | ICD-10-CM

## 2023-02-08 NOTE — Assessment & Plan Note (Signed)
Snoring, restless sleep, daytime sleepiness, BMI 56 all suspicious for underlying sleep apnea.  Set patient up for home sleep study.  Healthy sleep regimen discussed.  Advised to cut back on caffeine  Plan  Patient Instructions  Chest xray today . Set up for home sleep study  Healthy sleep regimen  Decrease caffeine intake.  Follow up in 6 weeks with PFT and As needed

## 2023-02-08 NOTE — Progress Notes (Addendum)
@Patient  ID: Ashley Herring, female    DOB: 21-Sep-1980, 42 y.o.   MRN: [id number]  Chief Complaint  Patient presents with   sleep consult    Referring provider: Jerrol Banana, MD  HPI: 42 year old female former smoker seen for sleep consult February 08, 2023 for snoring, restless sleep, daytime sleepiness  TEST/EVENTS :   02/08/2023 Sleep consult  Patient presents for sleep consult.  Kindly referred by primary care provider Dr. Ashley Royalty.  Patient complains of snoring, restless sleep, waking up tired.  Does have morning headaches, teeth clenching and occasional episodes of sleep paralysis.  Typically goes to bed between 8 PM and 11 PM.  Takes up to 2 hours to go to sleep and wakes up frequently.  She has been using trazodone 200 mg at bedtime for insomnia.  She says it helps some .  She has trouble going to sleep and staying asleep.  She also is on Elavil for anxiety.  She has no history of congestive heart failure or stroke.  Drinks up to 2 to 4 cups of caffeine daily.  Does drink caffeine at night at times.  She has upper partial dental plate.  Says she has had 2 sleep studies years ago that were both negative.  Epworth score is 6 out of 24.  Typically gets sleepy if she sits down to watch TV or rest and in the evening hours.  Is going to weight loss clinic to help with weight loss.  Current weight is at 351 pounds with a BMI of 56.  Patient says she does get night sweats at nighttime. Patient also is requesting an evaluation to make sure she does not have any lung problems.  She says she is smoked for several years on and off.  Recently quit 2 weeks ago.  Has a family history of lung cancer.  She says she occasionally gets intermittent cough and wheezing.  Has had no previous pulmonary function testing.  Is not on any inhalers.  Medical history significant for hypertension, hyperlipidemia, migraines  SH: Patient is married lives at home with her family.  She is a Runner, broadcasting/film/video.  Has 3 children.   Corliss Parish is 59 years old.  She quit smoking 2 weeks ago.  No alcohol or drug use.  Family history positive for allergies, asthma, cancer   Past Surgical History:  Procedure Laterality Date   COLONOSCOPY WITH PROPOFOL N/A 02/08/2021   Procedure: COLONOSCOPY WITH PROPOFOL;  Surgeon: Midge Minium, MD;  Location: [facility] ENDOSCOPY;  Service: Endoscopy;  Laterality: N/A;   HEMORRHOID SURGERY       Allergies  Allergen Reactions   Peanuts [Peanut Oil] Swelling    Immunization History  Administered Date(s) Administered   PPD Test 05/06/2015   Tdap 10/16/2019    Past Medical History:  Diagnosis Date   Back pain    Eye inflammation    Miscarriage    Sciatica     Tobacco History: Social History   Tobacco Use  Smoking Status Former   Types: Cigarettes   Quit date: 01/25/2023   Years since quitting: 0.0  Smokeless Tobacco Never  Tobacco Comments   Smoked one cigarette occassionally   Counseling given: Not Answered Tobacco comments: Smoked one cigarette occassionally   Outpatient Medications Prior to Visit  Medication Sig Dispense Refill   amitriptyline (ELAVIL) 100 MG tablet TAKE 1 TABLET(100 MG) BY MOUTH AT BEDTIME 90 tablet 0   amLODipine (NORVASC) 10 MG tablet TAKE 1 TABLET(10 MG) BY MOUTH DAILY 90 tablet  1   gabapentin (NEURONTIN) 100 MG capsule Take 1 capsule (100 mg total) by mouth at bedtime as needed. 30 capsule 0   lisinopril (ZESTRIL) 20 MG tablet TAKE 1 TABLET(20 MG) BY MOUTH DAILY 90 tablet 1   promethazine-dextromethorphan (PROMETHAZINE-DM) 6.25-15 MG/5ML syrup Take 5 mLs by mouth 4 (four) times daily as needed for cough. 118 mL 0   rosuvastatin (CRESTOR) 5 MG tablet Take 1 tablet (5 mg total) by mouth daily. 90 tablet 3   sertraline (ZOLOFT) 100 MG tablet TAKE 2 TABLETS(200 MG) BY MOUTH DAILY 180 tablet 0   traZODone (DESYREL) 100 MG tablet Take 2 tablets (200 mg total) by mouth at bedtime as needed for sleep. 180 tablet 0   amoxicillin-clavulanate (AUGMENTIN)  875-125 MG tablet Take 1 tablet by mouth 2 (two) times daily. 20 tablet 0   Vitamin D, Ergocalciferol, (DRISDOL) 1.25 MG (50000 UNIT) CAPS capsule Take 1 capsule (50,000 Units total) by mouth every 7 (seven) days. Take for 8 total doses(weeks) (Patient not taking: Reported on 02/08/2023) 8 capsule 0   No facility-administered medications prior to visit.     Review of Systems:   Constitutional:   No  weight loss, night sweats,  Fevers, chills,  +fatigue, or  lassitude.  HEENT:   No headaches,  Difficulty swallowing,  Tooth/dental problems, or  Sore throat,                No sneezing, itching, ear ache, nasal congestion, post nasal drip,   CV:  No chest pain,  Orthopnea, PND, swelling in lower extremities, anasarca, dizziness, palpitations, syncope.   GI  No heartburn, indigestion, abdominal pain, nausea, vomiting, diarrhea, change in bowel habits, loss of appetite, bloody stools.   Resp: No shortness of breath with exertion or at rest.  No excess mucus, no productive cough,  No non-productive cough,  No coughing up of blood.  No change in color of mucus.  No wheezing.  No chest wall deformity  Skin: no rash or lesions.  GU: no dysuria, change in color of urine, no urgency or frequency.  No flank pain, no hematuria   MS:  No joint pain or swelling.  No decreased range of motion.  No back pain.    Physical Exam  BP 128/76 (BP Location: Right Wrist, Cuff Size: Large)   Pulse 77   Temp 97.8 F (36.6 C) (Temporal)   Ht 5\' 6"  (1.676 m)   Wt (!) 351 lb 6.4 oz (159.4 kg)   SpO2 99%   BMI 56.72 kg/m   GEN: A/Ox3; pleasant , NAD, well nourished    HEENT:  [place]/AT,  NOSE-clear, THROAT-clear, no lesions, no postnasal drip or exudate noted.Class 3  MP airway    NECK:  Supple w/ fair ROM; no JVD; normal carotid impulses w/o bruits; no thyromegaly or nodules palpated; no lymphadenopathy.    RESP  Clear  P & A; w/o, wheezes/ rales/ or rhonchi. no accessory muscle use, no dullness to  percussion  CARD:  RRR, no m/r/g, no peripheral edema, pulses intact, no cyanosis or clubbing.  GI:   Soft & nt; nml bowel sounds; no organomegaly or masses detected.   Musco: Warm bil, no deformities or joint swelling noted.   Neuro: alert, no focal deficits noted.    Skin: Warm, no lesions or rashes    Lab Results:    BMET  BNP   ProBNP No results found for: "PROBNP"  Imaging: No results found.  No data to display          No results found for: "NITRICOXIDE"      Assessment & Plan:   Snoring Snoring, restless sleep, daytime sleepiness, BMI 56 all suspicious for underlying sleep apnea.  Set patient up for home sleep study.  Healthy sleep regimen discussed.  Advised to cut back on caffeine  Plan  Patient Instructions  Chest xray today . Set up for home sleep study  Healthy sleep regimen  Decrease caffeine intake.  Follow up in 6 weeks with PFT and As needed        Insomnia Chronic insomnia.  Healthy sleep regimen discussed.  Advised to cut back on caffeine.  Continue with maintenance regimen through primary care.  Plan  Patient Instructions  Chest xray today . Set up for home sleep study  Healthy sleep regimen  Decrease caffeine intake.  Follow up in 6 weeks with PFT and As needed        History of tobacco abuse Former smoker, intermittent cough and wheezing check chest x-ray today.  PFTs on return.  Rule out underlying COPD Congratulated on smoking cessation  Plan  Patient Instructions  Chest xray today . Set up for home sleep study  Healthy sleep regimen  Decrease caffeine intake.  Follow up in 6 weeks with PFT and As needed          Rubye Oaks, NP 02/08/2023

## 2023-02-08 NOTE — Assessment & Plan Note (Signed)
Former smoker, intermittent cough and wheezing check chest x-ray today.  PFTs on return.  Rule out underlying COPD Congratulated on smoking cessation  Plan  Patient Instructions  Chest xray today . Set up for home sleep study  Healthy sleep regimen  Decrease caffeine intake.  Follow up in 6 weeks with PFT and As needed

## 2023-02-08 NOTE — Assessment & Plan Note (Signed)
>>  ASSESSMENT AND PLAN FOR INSOMNIA WRITTEN ON 02/08/2023  4:58 PM BY PARRETT, TAMMY S, NP  Chronic insomnia.  Healthy sleep regimen discussed.  Advised to cut back on caffeine.  Continue with maintenance regimen through primary care.  Plan  Patient Instructions  Chest xray today . Set up for home sleep study  Healthy sleep regimen  Decrease caffeine intake.  Follow up in 6 weeks with PFT and As needed

## 2023-02-08 NOTE — Assessment & Plan Note (Signed)
Chronic insomnia.  Healthy sleep regimen discussed.  Advised to cut back on caffeine.  Continue with maintenance regimen through primary care.  Plan  Patient Instructions  Chest xray today . Set up for home sleep study  Healthy sleep regimen  Decrease caffeine intake.  Follow up in 6 weeks with PFT and As needed

## 2023-02-08 NOTE — Patient Instructions (Addendum)
Chest xray today . Set up for home sleep study  Healthy sleep regimen  Decrease caffeine intake.  Follow up in 6 weeks with PFT and As needed

## 2023-02-09 NOTE — Progress Notes (Signed)
Reviewed and agree with assessment/plan.   Coralyn Helling, MD [facility] Pulmonary/Critical Care 02/09/2023, 8:16 AM Pager:  [phone]

## 2023-02-14 ENCOUNTER — Ambulatory Visit: Payer: Medicaid Other | Admitting: Adult Health

## 2023-02-14 DIAGNOSIS — R0683 Snoring: Secondary | ICD-10-CM

## 2023-02-23 DIAGNOSIS — R0683 Snoring: Secondary | ICD-10-CM | POA: Diagnosis not present

## 2023-02-26 ENCOUNTER — Telehealth: Payer: Self-pay

## 2023-02-26 NOTE — Telephone Encounter (Signed)
Received medical record request from [facility] for HST.  It does not appear that patient has been made aware of results.   Tammy, please advise. Thanks

## 2023-02-27 ENCOUNTER — Ambulatory Visit (INDEPENDENT_AMBULATORY_CARE_PROVIDER_SITE_OTHER): Payer: Medicaid Other

## 2023-02-27 VITALS — BP 124/74 | HR 87 | Ht 66.0 in | Wt 353.0 lb

## 2023-02-27 DIAGNOSIS — N939 Abnormal uterine and vaginal bleeding, unspecified: Secondary | ICD-10-CM | POA: Diagnosis not present

## 2023-02-27 NOTE — Progress Notes (Signed)
   GYN ENCOUNTER  Encounter for abnormal uterine bleeding  Subjective  HPI: Ashley Herring is a 42 y.o. [id number] who presents today for  Abnormal bleeding. Seen in this clinic for same issue in Feb/Mar of this year. At that time, had not had a period for two months. The following month, she had a period that lasted for two weeks, then another period two weeks later that again lasted for two weeks. Her periods have been irregular since that time, sometimes lasting a few days, sometimes lasting for a couple weeks. Her most recent period began on 6/10. She is not bleeding at today's visit. Her most recent pap smear in February of this year was normal. She also reports mood and appetite changes since her periods have become more irregular.  She is not currently using any birth control.   She states that her mother had a hysterectomy at 43 yo due to fibroids.   Past Medical History:  Diagnosis Date   Back pain    Eye inflammation    Miscarriage    Sciatica    Past Surgical History:  Procedure Laterality Date   COLONOSCOPY WITH PROPOFOL N/A 02/08/2021   Procedure: COLONOSCOPY WITH PROPOFOL;  Surgeon: Midge Minium, MD;  Location: [facility];  Service: Endoscopy;  Laterality: N/A;   HEMORRHOID SURGERY     OB History     Gravida  5   Para  3   Term  3   Preterm  0   AB  2   Living  3      SAB  2   IAB      Ectopic      Multiple  0   Live Births  3        Obstetric Comments  Two NVD, denies gestational DM or PIH        Allergies  Allergen Reactions   Peanuts [Peanut Oil] Swelling    Review of Systems  12 point ROS negative except for pertinent positives noted in HPO above.   Objective  BP 124/74   Pulse 87   Ht 5\' 6"  (1.676 m)   Wt (!) 353 lb (160.1 kg)   LMP 02/12/2023   BMI 56.98 kg/m   Physical examination GENERAL APPEARANCE: alert, well appearing, in no apparent distress, oriented to person, place and time HEAD: normocephalic,  atraumatic ABDOMEN: soft, nontender, nondistended, no abnormal masses, no epigastric pain EXTREMITIES: no redness or tenderness in the calves or thighs SKIN: normal coloration and turgor, no rashes NEUROLOGIC: alert, oriented, normal speech, no focal findings or movement disorder noted Pelvic exam: normal external genitalia, vulva, vagina, cervix, uterus and adnexa. No CMT, uterus normal shape and size, no adnexal masses or tenderness.   Assessment/Plan - We reviewed the course of her symptoms and that they most closely align with hormonal changes associated with perimenopause. Normal pelvic exam today. Will plan for pelvic ultrasound to rule out other physiologic etiologies of AUB.  - We reviewed options for managing symptoms if ultrasound results are normal including no treatment, use of oral contraceptive, or use of LNG-IUD. She will follow up if symptoms become worse or she desires to manage with hormonal options.      Lindalou Hose Loella Hickle, CNM  02/27/23 2:22 PM

## 2023-03-01 NOTE — Telephone Encounter (Signed)
HST showed no significant sleep apnea.  Please set up office visit to discuss sleep study results in detail.

## 2023-03-01 NOTE — Telephone Encounter (Signed)
I have notified the patient and scheduled her an appt with Rhunette Croft, NP on 7/10 at 3:00pm.  Nothing further needed.

## 2023-03-05 NOTE — Progress Notes (Addendum)
 Scheduled to see Girard Lam NP on 7/10 at 3 pm.  Nothing further needed. Okay

## 2023-03-06 ENCOUNTER — Encounter: Payer: Self-pay | Admitting: Adult Health

## 2023-03-06 ENCOUNTER — Telehealth (INDEPENDENT_AMBULATORY_CARE_PROVIDER_SITE_OTHER): Payer: Medicaid Other | Admitting: Adult Health

## 2023-03-06 DIAGNOSIS — Z87891 Personal history of nicotine dependence: Secondary | ICD-10-CM

## 2023-03-06 DIAGNOSIS — R400 Somnolence: Secondary | ICD-10-CM | POA: Diagnosis not present

## 2023-03-06 DIAGNOSIS — R0683 Snoring: Secondary | ICD-10-CM

## 2023-03-06 NOTE — Progress Notes (Signed)
Virtual Visit via Video Note  I connected with [name] on 03/06/23 at  1:30 PM EDT by a video enabled telemedicine application and verified that I am speaking with the correct person using two identifiers.  Location: Patient: Home  Provider: Office    I discussed the limitations of evaluation and management by telemedicine and the availability of in person appointments. The patient expressed understanding and agreed to proceed.  History of Present Illness: 42 year old female former smoker seen for sleep and pulmonary consult February 08, 2023.  Patient had snoring and daytime sleepiness and shortness of breath  Today's video visit is to discuss sleep study results.  Patient was seen last month for a sleep consult.  She had been complaining of snoring and daytime sleepiness.  She was set up for a home sleep study that was done on February 14, 2023 that showed no significant sleep apnea with AHI at 2.4/hour and SpO2 low at 87% with average O2 saturation at 95%.  We went over her sleep study results in detail.  Patient says since her last visit she is actually starting to sleep better.  Feels that her sleep is not as restless.  Wakes up feeling not as tired.  She has an upper partial dental plate.  Is not a candidate for oral appliance for snoring.  Patient has a history of smoking.  She says she gets an intermittent cough and wheezing.  She says currently she is not having any problems.  She has been set up for pulmonary function testing which is still pending.  She says overall breathing is doing okay currently.  Chest x-ray last visit was clear.   Past Medical History:  Diagnosis Date   Back pain    Eye inflammation    Miscarriage    Sciatica     Current Outpatient Medications on File Prior to Visit  Medication Sig Dispense Refill   amitriptyline (ELAVIL) 100 MG tablet TAKE 1 TABLET(100 MG) BY MOUTH AT BEDTIME 90 tablet 0   amLODipine (NORVASC) 10 MG tablet TAKE 1 TABLET(10 MG) BY MOUTH  DAILY 90 tablet 1   gabapentin (NEURONTIN) 100 MG capsule Take 1 capsule (100 mg total) by mouth at bedtime as needed. 30 capsule 0   lisinopril (ZESTRIL) 20 MG tablet TAKE 1 TABLET(20 MG) BY MOUTH DAILY 90 tablet 1   rosuvastatin (CRESTOR) 5 MG tablet Take 1 tablet (5 mg total) by mouth daily. 90 tablet 3   sertraline (ZOLOFT) 100 MG tablet TAKE 2 TABLETS(200 MG) BY MOUTH DAILY 180 tablet 0   traZODone (DESYREL) 100 MG tablet Take 2 tablets (200 mg total) by mouth at bedtime as needed for sleep. 180 tablet 0   Vitamin D, Ergocalciferol, (DRISDOL) 1.25 MG (50000 UNIT) CAPS capsule Take 1 capsule (50,000 Units total) by mouth every 7 (seven) days. Take for 8 total doses(weeks) 8 capsule 0   No current facility-administered medications on file prior to visit.      Observations/Objective: Appears well in no acute distress  Assessment and Plan: Snoring and daytime sleepiness.  Sleep study shows no significant sleep apnea.  Sleep pattern seems to have improved.  She is not a candidate for oral appliance due to partial dentures.-We discussed a healthy sleep regimen.  Positional sleep.  For now symptom burden seems to be lower.  Will hold off on additional testing.  If symptoms continue or worsen could consider an in lab study if insurance would cover.  Hx of smoking , intermittent cough Sima Matas -  current not active.  Chest xray is clear.  PFT pending   Plan  Patient Instructions  Healthy sleep regimen  Keep caffeine caffeine intake low.  Avoid sleeping on back  Work on healthy weight loss.  Do drive if sleepy  Elevate head of bed at 30 degree angle.  PFT as planned  Follow up in 3 months and As needed         Follow Up Instructions:    I discussed the assessment and treatment plan with the patient. The patient was provided an opportunity to ask questions and all were answered. The patient agreed with the plan and demonstrated an understanding of the instructions.   The patient was  advised to call back or seek an in-person evaluation if the symptoms worsen or if the condition fails to improve as anticipated.  I provided 20 minutes of non-face-to-face time during this encounter.   Rubye Oaks, NP

## 2023-03-06 NOTE — Patient Instructions (Addendum)
Healthy sleep regimen  Keep caffeine caffeine intake low.  Avoid sleeping on back  Work on healthy weight loss.  Do drive if sleepy  Elevate head of bed at 30 degree angle.  PFT as planned  Follow up in 3 months and As needed

## 2023-03-06 NOTE — Progress Notes (Signed)
Reviewed and agree with assessment/plan.   Coralyn Helling, MD [facility] Pulmonary/Critical Care 03/06/2023, 3:55 PM Pager:  [phone]

## 2023-03-14 ENCOUNTER — Ambulatory Visit: Payer: Medicaid Other | Admitting: Nurse Practitioner

## 2023-03-22 ENCOUNTER — Ambulatory Visit (INDEPENDENT_AMBULATORY_CARE_PROVIDER_SITE_OTHER): Payer: Medicaid Other | Admitting: Family Medicine

## 2023-03-22 ENCOUNTER — Encounter: Payer: Self-pay | Admitting: Family Medicine

## 2023-03-22 VITALS — BP 132/80 | HR 80 | Ht 66.0 in | Wt 353.0 lb

## 2023-03-22 DIAGNOSIS — G5601 Carpal tunnel syndrome, right upper limb: Secondary | ICD-10-CM | POA: Diagnosis not present

## 2023-03-22 DIAGNOSIS — L732 Hidradenitis suppurativa: Secondary | ICD-10-CM | POA: Diagnosis not present

## 2023-03-22 DIAGNOSIS — I10 Essential (primary) hypertension: Secondary | ICD-10-CM

## 2023-03-22 MED ORDER — METFORMIN HCL ER 500 MG PO TB24
500.0000 mg | ORAL_TABLET | Freq: Every day | ORAL | 2 refills | Status: DC
Start: 2023-03-22 — End: 2023-10-10

## 2023-03-22 MED ORDER — DOXYCYCLINE HYCLATE 100 MG PO TABS
100.0000 mg | ORAL_TABLET | Freq: Every day | ORAL | 1 refills | Status: DC
Start: 2023-03-22 — End: 2023-10-10

## 2023-03-23 DIAGNOSIS — L732 Hidradenitis suppurativa: Secondary | ICD-10-CM | POA: Insufficient documentation

## 2023-03-23 NOTE — Assessment & Plan Note (Addendum)
Chronic relapsing and remittent course only involving left axilla. Some prior history of right upper arm "fullness" but denies same symptoms exactly. States that it has been red, irritated and slightly draining in past.   Exam consistent with stage 2 HS. We reviewed the nature of this condition and treatment strategies, plan as follows:  - Referral to dermatology - Doxycyline course x 30-60 days - Daily metformin, increase after 2 weeks if symptoms permit - We will follow peripherally

## 2023-03-23 NOTE — Assessment & Plan Note (Signed)
Did not respond to recent cortisone injection, noted hypopigmentation following injection which we discussed, offered dermatology vs watchful waiting. She is okay with observing this for now.  In regards to symptomatology, given recalcitrant nature, plan as follows: - EMG/NCS ordered - Referral to orthopedics for consideration of release surgery

## 2023-03-23 NOTE — Patient Instructions (Addendum)
-   Coordinator will contact to schedule referral to dermatology - Take doxycyline course x 30-60 days - Start daily metformin, increase after 2 weeks if symptoms permit  - Coordinator will contact to schedule EMG/NCS - Coordinator will contact to schedule referral to orthopedics for consideration of carpal tunnel release surgery

## 2023-03-23 NOTE — Progress Notes (Signed)
     Primary Care / Sports Medicine Office Visit  Patient Information:  Patient ID: Ashley Herring, female DOB: [birth year] Age: 42 y.o. MRN: [id number]   Ashley Herring is a pleasant 42 y.o. female presenting with the following:  Chief Complaint  Patient presents with   Edema    Under left arm, for 4 days    Vitals:   03/22/23 1430  BP: 132/80  Pulse: 80  SpO2: 98%   Vitals:   03/22/23 1430  Weight: (!) 353 lb (160.1 kg)  Height: 5\' 6"  (1.676 m)   Body mass index is 56.98 kg/m.  No results found.   Independent interpretation of notes and tests performed by another provider:   None  Procedures performed:   None  Pertinent History, Exam, Impression, and Recommendations:   Ashley Herring was seen today for edema.  Hidradenitis suppurativa of left axilla Assessment & Plan: Chronic relapsing and remittent course only involving left axilla. Some prior history of right upper arm "fullness" but denies same symptoms exactly. States that it has been red, irritated and slightly draining in past.   Exam consistent with stage 2 HS. We reviewed the nature of this condition and treatment strategies, plan as follows:  - Referral to dermatology - Doxycyline course x 30-60 days - Daily metformin, increase after 2 weeks if symptoms permit - We will follow peripherally  Orders: -     Doxycycline Hyclate; Take 1 tablet (100 mg total) by mouth daily.  Dispense: 30 tablet; Refill: 1 -     metFORMIN HCl ER; Take 1 tablet (500 mg total) by mouth daily with breakfast. Increase to 2 tablets every AM after 2 weeks if GI symptoms permit.  Dispense: 90 tablet; Refill: 2 -     Ambulatory referral to Dermatology  Right carpal tunnel syndrome Assessment & Plan: Did not respond to recent cortisone injection, noted hypopigmentation following injection which we discussed, offered dermatology vs watchful waiting. She is okay with observing this for now.  In regards to symptomatology,  given recalcitrant nature, plan as follows: - EMG/NCS ordered - Referral to orthopedics for consideration of release surgery  Orders: -     NCV with EMG(electromyography) -     Ambulatory referral to Orthopedic Surgery  Primary hypertension Assessment & Plan: Stable on current regimen.      Orders & Medications Meds ordered this encounter  Medications   doxycycline (VIBRA-TABS) 100 MG tablet    Sig: Take 1 tablet (100 mg total) by mouth daily.    Dispense:  30 tablet    Refill:  1   metFORMIN (GLUCOPHAGE-XR) 500 MG 24 hr tablet    Sig: Take 1 tablet (500 mg total) by mouth daily with breakfast. Increase to 2 tablets every AM after 2 weeks if GI symptoms permit.    Dispense:  90 tablet    Refill:  2   Orders Placed This Encounter  Procedures   Ambulatory referral to Orthopedic Surgery   Ambulatory referral to Dermatology   NCV with EMG(electromyography)     No follow-ups on file.     Jerrol Banana, MD, [facility]   Primary Care Sports Medicine Primary Care and Sports Medicine at [facility]

## 2023-03-23 NOTE — Assessment & Plan Note (Signed)
Stable on current regimen   

## 2023-03-29 ENCOUNTER — Other Ambulatory Visit: Payer: Self-pay

## 2023-03-29 ENCOUNTER — Ambulatory Visit (INDEPENDENT_AMBULATORY_CARE_PROVIDER_SITE_OTHER): Payer: Medicaid Other

## 2023-03-29 DIAGNOSIS — N939 Abnormal uterine and vaginal bleeding, unspecified: Secondary | ICD-10-CM

## 2023-04-02 ENCOUNTER — Encounter: Payer: Self-pay | Admitting: Family Medicine

## 2023-04-03 NOTE — Telephone Encounter (Signed)
I dont see note about neurologist. Also will let pt know "Call your insurance and see who accepts your insurance, let me know and I will send it there. "

## 2023-04-04 NOTE — Telephone Encounter (Signed)
Please review.  KP

## 2023-04-04 NOTE — Telephone Encounter (Signed)
FYI  KP

## 2023-05-01 ENCOUNTER — Ambulatory Visit: Payer: Medicaid Other | Attending: Adult Health

## 2023-05-08 ENCOUNTER — Ambulatory Visit: Payer: Medicaid Other | Admitting: Adult Health

## 2023-05-08 ENCOUNTER — Telehealth: Payer: Self-pay | Admitting: Adult Health

## 2023-05-08 NOTE — Telephone Encounter (Signed)
Patient needs to schedule PFT. Patient phone number is [phone].

## 2023-05-09 NOTE — Telephone Encounter (Signed)
We don't have any more PFT appts until they give me the PFT appts for October

## 2023-05-28 NOTE — Telephone Encounter (Signed)
I have left a message asking the patient to call and reschedule her PFT

## 2023-06-22 ENCOUNTER — Ambulatory Visit: Payer: Medicaid Other | Admitting: Adult Health

## 2023-07-15 ENCOUNTER — Encounter: Payer: Self-pay | Admitting: Family Medicine

## 2023-07-16 ENCOUNTER — Encounter: Payer: Medicaid Other | Admitting: Family Medicine

## 2023-07-16 NOTE — Telephone Encounter (Signed)
Please reschedule appt.  KP

## 2023-08-07 NOTE — Telephone Encounter (Signed)
I have spoke with the patient and her PFT has been scheduled on 08/09/23 @ 8:00am at [facility]

## 2023-08-09 ENCOUNTER — Ambulatory Visit: Payer: Medicaid Other | Attending: Adult Health

## 2023-08-14 ENCOUNTER — Ambulatory Visit: Payer: Medicaid Other | Admitting: Adult Health

## 2023-08-21 ENCOUNTER — Telehealth: Payer: Self-pay

## 2023-08-21 ENCOUNTER — Telehealth: Payer: Self-pay | Admitting: Family Medicine

## 2023-08-21 NOTE — Telephone Encounter (Signed)
Copied from CRM [id number]. Topic: Medical Record Request - Provider/Facility Request >> Aug 21, 2023  9:47 AM Epimenio Foot F wrote: Reason for CRM: Lawson Fiscal with [facility] is calling in to request Medical Records on this patient. [phone] ext [phone]. Lawson Fiscal says pt surgery is THURSDAY 08/23/23 and she needs the records immediately. Please follow up with Lawson Fiscal.

## 2023-08-21 NOTE — Telephone Encounter (Signed)
Called pt to schedule an appointment for medical clearance.  KP

## 2023-08-21 NOTE — Telephone Encounter (Signed)
Spoke to [place] she stated that last office visit, labs from 07/2022, and most recent EKG is ok to send. Printed and faxed.  KP

## 2023-10-10 ENCOUNTER — Encounter: Payer: Self-pay | Admitting: Family Medicine

## 2023-10-10 ENCOUNTER — Ambulatory Visit: Payer: Medicaid Other | Admitting: Family Medicine

## 2023-10-10 VITALS — BP 122/78 | HR 80 | Ht 66.0 in | Wt 351.2 lb

## 2023-10-10 DIAGNOSIS — I159 Secondary hypertension, unspecified: Secondary | ICD-10-CM | POA: Diagnosis not present

## 2023-10-10 DIAGNOSIS — Z23 Encounter for immunization: Secondary | ICD-10-CM | POA: Diagnosis not present

## 2023-10-10 DIAGNOSIS — F419 Anxiety disorder, unspecified: Secondary | ICD-10-CM

## 2023-10-10 DIAGNOSIS — H538 Other visual disturbances: Secondary | ICD-10-CM

## 2023-10-10 DIAGNOSIS — Z1231 Encounter for screening mammogram for malignant neoplasm of breast: Secondary | ICD-10-CM | POA: Diagnosis not present

## 2023-10-10 DIAGNOSIS — F32A Depression, unspecified: Secondary | ICD-10-CM

## 2023-10-10 DIAGNOSIS — Z0000 Encounter for general adult medical examination without abnormal findings: Secondary | ICD-10-CM

## 2023-10-10 DIAGNOSIS — R0683 Snoring: Secondary | ICD-10-CM

## 2023-10-10 DIAGNOSIS — E785 Hyperlipidemia, unspecified: Secondary | ICD-10-CM

## 2023-10-10 DIAGNOSIS — R7309 Other abnormal glucose: Secondary | ICD-10-CM

## 2023-10-10 DIAGNOSIS — K582 Mixed irritable bowel syndrome: Secondary | ICD-10-CM

## 2023-10-10 MED ORDER — AMITRIPTYLINE HCL 100 MG PO TABS: 100.0000 mg | ORAL_TABLET | Freq: Every day | ORAL | 3 refills | Status: DC

## 2023-10-10 MED ORDER — SERTRALINE HCL 100 MG PO TABS: 100.0000 mg | ORAL_TABLET | Freq: Every day | ORAL | 3 refills | Status: DC

## 2023-10-10 MED ORDER — ROSUVASTATIN CALCIUM 5 MG PO TABS: 5.0000 mg | ORAL_TABLET | Freq: Every day | ORAL | 3 refills | Status: DC

## 2023-10-10 MED ORDER — BUPROPION HCL ER (XL) 150 MG PO TB24: 150.0000 mg | ORAL_TABLET | ORAL | 0 refills | Status: DC

## 2023-10-10 NOTE — Assessment & Plan Note (Signed)
 She reports intermittent blurred vision that has been occurring for months. She has not seen an eye doctor recently due to difficulties in scheduling an appointment.  Intermittent Blurred Vision Reports months of intermittent blurred vision.  BP reassuring, no associated symptoms, overdue for eye exam. -Refer to optometry for further evaluation.

## 2023-10-10 NOTE — Assessment & Plan Note (Signed)
 Regarding sleep, she reports occasional snoring, particularly when extremely tired, but a home sleep study did not show significant sleep apnea. Positional sleep adjustments seem to help. She does not have difficulty falling asleep.

## 2023-10-10 NOTE — Assessment & Plan Note (Signed)
 Has a history of high blood pressure, which has been well-controlled despite no medication for the past month, potentially stress as a potential contributing factor.  Hypertension Blood pressure well controlled off medication for the past month. Discussed potential role of stress in hypertension. -Discontinue antihypertensive medication. Monitor blood pressure.

## 2023-10-10 NOTE — Assessment & Plan Note (Signed)
 She has a history of IBS with constipation predominance recently. She has started drinking protein shakes, which may have influenced her bowel habits. She is awaiting bariatric surgery, having completed most preliminary requirements, with only lab work pending before scheduling the surgery.  Irritable Bowel Syndrome Reports more constipation than diarrhea recently. Discussed potential changes post upcoming surgery. -Monitor symptoms post-surgery and consider treatment for residual symptoms.

## 2023-10-10 NOTE — Patient Instructions (Addendum)
-   Obtain fasting labs with orders provided (can have water or black coffee but otherwise no food or drink x 8 hours before labs) - Review information provided - Attend eye doctor annually, dentist every 6 months, work towards or maintain 30 minutes of moderate intensity physical activity at least 5 days per week, and consume a balanced diet - Return in 1 year for physical - Contact us for any questions between now and then   Patient Steps:  1. ADHD: Schedule an appointment with mental health specialist for ADHD evaluation and management. 2. Depression/Anxiety: Take sertraline 100mg  daily and start bupropion as prescribed. 3. Hypertension: Discontinue antihypertensive medication and monitor blood pressure regularly. 4. Intermittent Blurred Vision: Schedule an appointment with an ophthalmologist for an eye exam. 5. IBS: Monitor symptoms after bariatric surgery; evaluate for further treatment if needed.

## 2023-10-10 NOTE — Progress Notes (Signed)
 Annual Physical Exam Visit  Patient Information:  Patient ID: Ashley Herring, female DOB: 27-May-1981 Age: 43 y.o. MRN: [id number]   Subjective:   CC: Annual Physical Exam  HPI:  Ashley Herring is here for their annual physical.  I reviewed the past medical history, family history, social history, surgical history, and allergies today and changes were made as necessary.  Please see the problem list section below for additional details.  Past Medical History: Past Medical History:  Diagnosis Date   Back pain    Breast lump 01/31/2019   Eye inflammation    Miscarriage    Nipple problem 07/10/2019   Plantar fasciitis, bilateral 10/27/2022   Sciatica    Past Surgical History: Past Surgical History:  Procedure Laterality Date   COLONOSCOPY WITH PROPOFOL  N/A 02/08/2021   Procedure: COLONOSCOPY WITH PROPOFOL ;  Surgeon: Jinny Carmine, MD;  Location: [facility] ENDOSCOPY;  Service: Endoscopy;  Laterality: N/A;   HEMORRHOID SURGERY     Family History: Family History  Problem Relation Age of Onset   Hypertension Mother    Diabetes Mother    Thyroid disease Mother    Hypertension Father    Diabetes Father    Breast cancer Paternal Aunt 17       Deceased from metastasis   Cancer Maternal Grandmother    Brain cancer Maternal Grandmother    Brain cancer Paternal Grandmother    Colon cancer Paternal Grandfather    Asthma Daughter    Allergies: Allergies  Allergen Reactions   Peanuts [Peanut Oil] Swelling   Health Maintenance: Health Maintenance  Topic Date Due   COVID-19 Vaccine (1 - 2024-25 season) Never done   Cervical Cancer Screening (HPV/Pap Cotest)  10/18/2027   DTaP/Tdap/Td (2 - Td or Tdap) 10/15/2029   INFLUENZA VACCINE  Completed   Hepatitis C Screening  Completed   HIV Screening  Completed   HPV VACCINES  Aged Out    HM Colonoscopy   This patient has no relevant Health Maintenance data.    Medications: No current outpatient medications on file prior  to visit.   No current facility-administered medications on file prior to visit.    Objective:   Vitals:   10/10/23 0841  BP: 122/78  Pulse: 80  SpO2: 97%   Vitals:   10/10/23 0841  Weight: (!) 351 lb 3.2 oz (159.3 kg)  Height: 5' 6 (1.676 m)   Body mass index is 56.69 kg/m.  General: Well Developed, well nourished, and in no acute distress.  Neuro: Alert and oriented x3, extra-ocular muscles intact, sensation grossly intact. Cranial nerves II through XII are grossly intact, motor, sensory, and coordinative functions are intact. HEENT: Normocephalic, atraumatic, neck supple, no masses, no lymphadenopathy, thyroid nonenlarged. Oropharynx, nasopharynx, external ear canals are unremarkable. Skin: Warm and dry, no rashes noted.  Cardiac: Regular rate and rhythm, no murmurs rubs or gallops. No peripheral edema. Pulses symmetric. Respiratory: Clear to auscultation bilaterally. Speaking in full sentences.  Abdominal: Soft, nontender, nondistended, positive bowel sounds, no masses, no organomegaly. Musculoskeletal: Stable, and with full range of motion.  Impression and Recommendations:   The patient was counselled, risk factors were discussed, and anticipatory guidance given.  Problem List Items Addressed This Visit     Anxiety and depression (Chronic)   She experiences significant difficulty focusing, which she identifies as her primary concern. She has attempted various strategies, such as keeping her shoes on to maintain focus, but these have not been effective. 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 She has a stated history of ADHD per evaluation by [facility].  In addition to ADHD, she experiences mood-related issues, particularly stress, exacerbated by being short-staffed at work. She is on sertraline  100 mg, which she is tolerating well. She is considering additional medications to  help with mood and focus.  Depression/Anxiety Previously on sertraline  100mg  with some benefit. -Continue sertraline  100mg  and amitriptyline  100mg . -Start bupropion  150 mg XR. -Video visit in 1 month.  ADHD Reports difficulty focusing and rearranging items frequently. Discussed overlap of symptoms with depression and anxiety and need for formal diagnosis, further evaluation, and management. -Advised patient to schedule follow-up visit with [facility] for further evaluation and management.      Relevant Medications   sertraline  (ZOLOFT ) 100 MG tablet   amitriptyline  (ELAVIL ) 100 MG tablet   buPROPion  (WELLBUTRIN  XL) 150 MG 24 hr tablet   Blurred vision, bilateral   She reports intermittent blurred vision that has been occurring for months. She has not seen an eye doctor recently due to difficulties in scheduling an appointment.  Intermittent Blurred Vision Reports months of intermittent blurred vision.  BP reassuring, no associated symptoms, overdue for eye exam. -Refer to optometry for further evaluation.      Relevant Orders   Ambulatory referral to Optometry   Hyperlipidemia   Relevant Medications   rosuvastatin  (CRESTOR ) 5 MG tablet   Other Relevant Orders   Comprehensive metabolic panel   Lipid panel   Hypertension   Has a history of high blood pressure, which has been well-controlled despite no medication for the past month, potentially stress as a potential contributing factor.  Hypertension Blood pressure well controlled off medication for the past month. Discussed potential role of stress in hypertension. -Discontinue antihypertensive medication. Monitor blood pressure.      Relevant Medications   rosuvastatin  (CRESTOR ) 5 MG tablet   IBS (irritable bowel syndrome)   She has a history of IBS with constipation predominance recently. She has started drinking protein shakes, which may have influenced her bowel habits. She is awaiting bariatric surgery, having completed most  preliminary requirements, with only lab work pending before scheduling the surgery.  Irritable Bowel Syndrome Reports more constipation than diarrhea recently. Discussed potential changes post upcoming surgery. -Monitor symptoms post-surgery and consider treatment for residual symptoms.      Snoring   Regarding sleep, she reports occasional snoring, particularly when extremely tired, but a home sleep study did not show significant sleep apnea. Positional sleep adjustments seem to help. She does not have difficulty falling asleep.      Other Visit Diagnoses       Healthcare maintenance    -  Primary   Relevant Orders   CBC   Flu vaccine trivalent PF, 6mos and older(Flulaval,Afluria,Fluarix,Fluzone) (Completed)     Abnormal glucose       Relevant Orders   Hemoglobin A1c     Encounter for screening mammogram for malignant neoplasm of breast            Orders & Medications Medications:  Meds ordered this encounter  Medications   sertraline  (ZOLOFT ) 100 MG tablet    Sig: Take 1 tablet (100 mg total) by mouth daily.    Dispense:  90 tablet    Refill:  3   amitriptyline  (ELAVIL ) 100 MG tablet    Sig: Take 1 tablet (100 mg total) by mouth at bedtime.    Dispense:  90 tablet  Refill:  3   rosuvastatin  (CRESTOR ) 5 MG tablet    Sig: Take 1 tablet (5 mg total) by mouth daily.    Dispense:  90 tablet    Refill:  3   buPROPion  (WELLBUTRIN  XL) 150 MG 24 hr tablet    Sig: Take 1 tablet (150 mg total) by mouth every morning.    Dispense:  90 tablet    Refill:  0   Orders Placed This Encounter  Procedures   Flu vaccine trivalent PF, 6mos and older(Flulaval,Afluria,Fluarix,Fluzone)   CBC   Comprehensive metabolic panel   Hemoglobin A1c   Lipid panel   Ambulatory referral to Optometry     Return in about 4 weeks (around 11/07/2023) for video visit medication follow-up.    Selinda JINNY Ku, MD, [facility]   Primary Care Sports Medicine Primary Care and Sports Medicine at  [facility]

## 2023-10-10 NOTE — Assessment & Plan Note (Signed)
 She experiences significant difficulty focusing, which she identifies as her primary concern. She has attempted various strategies, such as keeping her shoes on to maintain focus, but these have not been effective. 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 She has a stated history of ADHD per evaluation by [facility].  In addition to ADHD, she experiences mood-related issues, particularly stress, exacerbated by being short-staffed at work. She is on sertraline  100 mg, which she is tolerating well. She is considering additional medications to help with mood and focus.  Depression/Anxiety Previously on sertraline  100mg  with some benefit. -Continue sertraline  100mg  and amitriptyline  100mg . -Start bupropion  150 mg XR. -Video visit in 1 month.  ADHD Reports difficulty focusing and rearranging items frequently. Discussed overlap of symptoms with depression and anxiety and need for formal diagnosis, further evaluation, and management. -Advised patient to schedule follow-up visit with CBC for further evaluation and management.

## 2023-10-17 ENCOUNTER — Encounter: Payer: Self-pay | Admitting: Family Medicine

## 2023-10-23 ENCOUNTER — Encounter: Payer: Self-pay | Admitting: Family Medicine

## 2023-10-23 LAB — CBC
Hematocrit: 38.4 % (ref 34.0–46.6)
Hemoglobin: 12.1 g/dL (ref 11.1–15.9)
MCH: 28 pg (ref 26.6–33.0)
MCHC: 31.5 g/dL (ref 31.5–35.7)
MCV: 89 fL (ref 79–97)
Platelets: 381 10*3/uL (ref 150–450)
RBC: 4.32 x10E6/uL (ref 3.77–5.28)
RDW: 14.4 % (ref 11.7–15.4)
WBC: 9.1 10*3/uL (ref 3.4–10.8)

## 2023-10-23 LAB — COMPREHENSIVE METABOLIC PANEL
ALT: 14 [IU]/L (ref 0–32)
AST: 16 [IU]/L (ref 0–40)
Albumin: 4.1 g/dL (ref 3.9–4.9)
Alkaline Phosphatase: 58 [IU]/L (ref 44–121)
BUN/Creatinine Ratio: 15 (ref 9–23)
BUN: 10 mg/dL (ref 6–24)
Bilirubin Total: 0.3 mg/dL (ref 0.0–1.2)
CO2: 26 mmol/L (ref 20–29)
Calcium: 9.5 mg/dL (ref 8.7–10.2)
Chloride: 103 mmol/L (ref 96–106)
Creatinine, Ser: 0.65 mg/dL (ref 0.57–1.00)
Globulin, Total: 2.8 g/dL (ref 1.5–4.5)
Glucose: 78 mg/dL (ref 70–99)
Potassium: 4.6 mmol/L (ref 3.5–5.2)
Sodium: 140 mmol/L (ref 134–144)
Total Protein: 6.9 g/dL (ref 6.0–8.5)
eGFR: 113 mL/min/{1.73_m2} (ref >59)

## 2023-10-23 LAB — LIPID PANEL
Chol/HDL Ratio: 5.3 {ratio} — ABNORMAL HIGH (ref 0.0–4.4)
Cholesterol, Total: 218 mg/dL — ABNORMAL HIGH (ref 100–199)
HDL: 41 mg/dL (ref >39)
LDL Chol Calc (NIH): 160 mg/dL — ABNORMAL HIGH (ref 0–99)
Triglycerides: 97 mg/dL (ref 0–149)
VLDL Cholesterol Cal: 17 mg/dL (ref 5–40)

## 2023-10-23 LAB — HEMOGLOBIN A1C
Est. average glucose Bld gHb Est-mCnc: 123 mg/dL
Hgb A1c MFr Bld: 5.9 % — ABNORMAL HIGH (ref 4.8–5.6)

## 2023-10-24 NOTE — Telephone Encounter (Signed)
Left voice mail to set up appointment for procedure visit.Please schedule 40 min visit slot for left foot injection, patient preference on time.

## 2023-10-24 NOTE — Telephone Encounter (Signed)
 Please advise. Thank you

## 2023-11-07 ENCOUNTER — Encounter: Payer: Medicaid Other | Admitting: Family Medicine

## 2023-11-07 NOTE — Progress Notes (Signed)
Erroneous - no show

## 2023-12-19 DIAGNOSIS — Z9884 Bariatric surgery status: Secondary | ICD-10-CM | POA: Insufficient documentation

## 2023-12-31 ENCOUNTER — Ambulatory Visit: Payer: Medicaid Other | Admitting: Medical

## 2023-12-31 NOTE — Progress Notes (Deleted)
  Cardiology Office Note:  .   Date:  12/31/2023  ID:  Ashley Herring, DOB [birth year], MRN [id number] PCP: Ma Saupe, MD  [facility] Providers Cardiologist:  None { Click to update primary MD,subspecialty MD or APP then REFRESH:1}   History of Present Illness: .   Ashley Herring is a 43 y.o. female  with a hx of cardiomegaly, palpitations, remote syncope, morbid obesity, preeclampsia during pregnancy, EF 55-60% by echo who presents for 3 month follow-up.     In 2021 she developed preeclampsia and her blood pressure was close to 190 mmHg.  She had mild headache but no other symptoms.  There was mild swelling in lower extremities.  She required multiple antihypertensive medications.  She had a chest x-ray that showed cardiomegaly and BNP was mildly elevated at 195.  She had an echocardiogram done that showed an EF of 55 to 60% with mildly dilated left ventricle and mild LVH, mild mitral regurgitation with no other significant abnormalities.  The patient's blood pressure improved after delivery and she was discharged home on nifedipine .  She did not have these issues with 2 previous pregnancies.   Seen 01/29/20 and reported palpitations and heart monitor was ordered. Heart monitor showed I short run of SVT, otherwise normal.    Seen 06/01/21 and reported palpitations. 2 week heart monitor was ordered. Heart monitor showed NSR with rare PACs and PVCs with no significant arrhythmia. Labetolol was stopped and Lopressor  was started.    The patient was seen 07/2022 and was overall doing well. She was feeling better on sertraline .  She reports palpitations, however she had not noticed she was off Lopressor  after a pharmacy change. Lopressor  was restarted and a heart monitor was ordered. Heart monitor showed NSR, 1 run of wide complex tachycardia lasting 6 beats. This could be VT vs SVT with aberrancy.   The patient was last seen 10/03/23 and reported improved symptoms on increased dose  of Lopressor .   ROS: ***  Studies Reviewed: .        *** Risk Assessment/Calculations:   {Does this patient have ATRIAL FIBRILLATION?:[id number]} No BP recorded.  {Refresh Note OR Click here to enter BP  :1}***       Physical Exam:   VS:  There were no vitals taken for this visit.   Wt Readings from Last 3 Encounters:  10/10/23 (!) 351 lb 3.2 oz (159.3 kg)  03/22/23 (!) 353 lb (160.1 kg)  02/27/23 (!) 353 lb (160.1 kg)    GEN: Well nourished, well developed in no acute distress NECK: No JVD; No carotid bruits CARDIAC: ***RRR, no murmurs, rubs, gallops RESPIRATORY:  Clear to auscultation without rales, wheezing or rhonchi  ABDOMEN: Soft, non-tender, non-distended EXTREMITIES:  No edema; No deformity   ASSESSMENT AND PLAN: .   ***    {Are you ordering a CV Procedure (e.g. stress test, cath, DCCV, TEE, etc)?   Press F2        :[id number]}  Dispo: ***  Signed, Ellianna Ruest Rebekah Canada, PA-C

## 2024-01-16 ENCOUNTER — Ambulatory Visit: Admitting: Medical

## 2024-01-16 NOTE — Progress Notes (Deleted)
  Cardiology Office Note:  .   Date:  01/16/2024  ID:  [name], DOB March 30, 1981, MRN [id number] PCP: Ma Saupe, MD  [facility] Providers Cardiologist:  None { Click to update primary MD,subspecialty MD or APP then REFRESH:1}   History of Present Illness: .   Denym Tullio is a 43 y.o. female  with a hx of cardiomegaly, palpitations, remote syncope, morbid obesity, preeclampsia during pregnancy, EF 55-60% by echo who presents for 6 month follow-up.     In 2021 she developed preeclampsia and her blood pressure was close to 190 mmHg.  She had mild headache but no other symptoms.  There was mild swelling in lower extremities.  She required multiple antihypertensive medications.  She had a chest x-ray that showed cardiomegaly and BNP was mildly elevated at 195.  She had an echocardiogram done that showed an EF of 55 to 60% with mildly dilated left ventricle and mild LVH, mild mitral regurgitation with no other significant abnormalities.  The patient's blood pressure improved after delivery and she was discharged home on nifedipine .  She did not have these issues with 2 previous pregnancies.   Seen 01/29/20 and reported palpitations and heart monitor was ordered. Heart monitor showed I short run of SVT, otherwise normal.    Seen 06/01/21 and reported palpitations. 2 week heart monitor was ordered. Heart monitor showed NSR with rare PACs and PVCs with no significant arrhythmia. Labetolol was stopped and Lopressor  was started.    Seen 07/2022 and was overall doing well. She was feeling better on sertraline .  She reports palpitations, however she had not noticed she was off Lopressor  after a pharmacy change. Lopressor  was restarted and a heart monitor was ordered.Heart monitor showed NSR, 1 run of wide complex tachycardia lasting 6 beats. This could be VT vs SVT with aberrancy.  Today,   ROS: ***  Studies Reviewed: .        *** Risk Assessment/Calculations:   {Does  this patient have ATRIAL FIBRILLATION?:[id number]} No BP recorded.  {Refresh Note OR Click here to enter BP  :1}***       Physical Exam:   VS:  There were no vitals taken for this visit.   Wt Readings from Last 3 Encounters:  10/10/23 (!) 351 lb 3.2 oz (159.3 kg)  03/22/23 (!) 353 lb (160.1 kg)  02/27/23 (!) 353 lb (160.1 kg)    GEN: Well nourished, well developed in no acute distress NECK: No JVD; No carotid bruits CARDIAC: ***RRR, no murmurs, rubs, gallops RESPIRATORY:  Clear to auscultation without rales, wheezing or rhonchi  ABDOMEN: Soft, non-tender, non-distended EXTREMITIES:  No edema; No deformity   ASSESSMENT AND PLAN: .   ***    {Are you ordering a CV Procedure (e.g. stress test, cath, DCCV, TEE, etc)?   Press F2        :[id number]}  Dispo: ***  Signed, Saaya Procell Rebekah Canada, PA-C

## 2024-01-17 ENCOUNTER — Encounter: Payer: Self-pay | Admitting: Medical

## 2024-01-17 ENCOUNTER — Ambulatory Visit: Attending: Medical | Admitting: Medical

## 2024-01-17 VITALS — BP 132/90 | HR 73 | Ht 66.0 in | Wt 324.8 lb

## 2024-01-17 DIAGNOSIS — I10 Essential (primary) hypertension: Secondary | ICD-10-CM | POA: Diagnosis present

## 2024-01-17 DIAGNOSIS — Z6841 Body Mass Index (BMI) 40.0 and over, adult: Secondary | ICD-10-CM | POA: Insufficient documentation

## 2024-01-17 DIAGNOSIS — R002 Palpitations: Secondary | ICD-10-CM | POA: Insufficient documentation

## 2024-01-17 MED ORDER — METOPROLOL TARTRATE 50 MG PO TABS: 50.0000 mg | ORAL_TABLET | Freq: Two times a day (BID) | ORAL | 3 refills | Status: DC

## 2024-01-17 NOTE — Progress Notes (Signed)
 Cardiology Office Note:  .   Date:  01/17/2024  ID:  Ashley Herring, DOB February 23, 1981, MRN [id number] PCP: Ma Saupe, MD   HeartCare Providers Cardiologist:  Antionette Kirks, MD     History of Present Illness: .   Ashley Herring is a 43 y.o. female  with a hx of cardiomegaly, palpitations, remote syncope, morbid obesity, preeclampsia during pregnancy, s/p laparoscopic Roux-en-Y gastric bypass, EF 55-60% by echo who presents for 3 month follow-up.     In 2021 she developed preeclampsia and her blood pressure was close to 190 mmHg.  She had mild headache but no other symptoms.  There was mild swelling in lower extremities.  She required multiple antihypertensive medications.  She had a chest x-ray that showed cardiomegaly and BNP was mildly elevated at 195.  She had an echocardiogram done that showed an EF of 55 to 60% with mildly dilated left ventricle and mild LVH, mild mitral regurgitation with no other significant abnormalities.  The patient's blood pressure improved after delivery and she was discharged home on nifedipine .  She did not have these issues with 2 previous pregnancies.   Seen 01/29/20 and reported palpitations and heart monitor was ordered. Heart monitor showed I short run of SVT, otherwise normal.    Seen 06/01/21 and reported palpitations. 2 week heart monitor was ordered. Heart monitor showed NSR with rare PACs and PVCs with no significant arrhythmia. Labetolol was stopped and Lopressor  was started.    The patient was seen 07/2022 and was overall doing well. She was feeling better on sertraline .  She reports palpitations, however she had not noticed she was off Lopressor  after a pharmacy change. Lopressor  was restarted and a heart monitor was ordered. Heart monitor showed NSR, 1 run of wide complex tachycardia lasting 6 beats. This could be VT vs SVT with aberrancy.    The patient was last seen 10/03/23 and reported improved symptoms on increased dose of  Lopressor .   Today, the patient is overall doing well. She had bariatric surgery last month and has been losing weight, 20-30lbs. Appetite is low. She is out of work at this moment and stress is low. She denies palpitations, chest pain or shortness of breath. She is staying active at home.     Studies Reviewed: Aaron Aas   EKG Interpretation Date/Time:  Thursday Jan 17 2024 14:10:41 EDT Ventricular Rate:  73 PR Interval:  152 QRS Duration:  92 QT Interval:  406 QTC Calculation: 447 R Axis:   49  Text Interpretation: Normal sinus rhythm Normal ECG When compared with ECG of 05-Dec-2019 21:59, No significant change was found Confirmed by Gennaro Khat, Dareen Gutzwiller ([id number]) on 01/17/2024 2:16:38 PM    Heart monitor 08/2022   Patient had a min HR of 49 bpm, max HR of 184 bpm, and avg HR of 76 bpm. Predominant underlying rhythm was Sinus Rhythm.  1 run of wide-complex tachycardia  lasting 6 beats with a max rate of 184 bpm (avg 165 bpm).  This could be due to ventricular tachycardia versus SVT with aberrancy Rare PACs and rare PVCs. Most triggered events did not correlate with arrhythmia.  Heart monitor 06/2021 Patient had a min HR of 52 bpm, max HR of 129 bpm, and avg HR of 78 bpm.  Predominant underlying rhythm was Sinus Rhythm.  Rare PACs and rare PVCs. No significant arrhythmia.   Echo 06/2021  1. Challenging image quality   2. Left ventricular ejection fraction, by estimation, is 60 to 65%. The  left  ventricle has normal function. The left ventricle has no regional  wall motion abnormalities. Left ventricular diastolic parameters are  consistent with Grade II diastolic  dysfunction (pseudonormalization).   3. Right ventricular systolic function is normal. The right ventricular  size is normal. There is normal pulmonary artery systolic pressure. The  estimated right ventricular systolic pressure is 34.4 mmHg.   4. The mitral valve was not well visualized. Mild mitral valve  regurgitation. No  evidence of mitral stenosis.      Physical Exam:   VS:  BP (!) 132/90 (BP Location: Left Wrist)   Pulse 73   Ht 5\' 6"  (1.676 m)   Wt (!) 324 lb 12.8 oz (147.3 kg)   SpO2 98%   BMI 52.42 kg/m    Wt Readings from Last 3 Encounters:  01/17/24 (!) 324 lb 12.8 oz (147.3 kg)  10/10/23 (!) 351 lb 3.2 oz (159.3 kg)  03/22/23 (!) 353 lb (160.1 kg)    GEN: Well nourished, well developed in no acute distress NECK: No JVD; No carotid bruits CARDIAC: RRR, no murmurs, rubs, gallops RESPIRATORY:  Clear to auscultation without rales, wheezing or rhonchi  ABDOMEN: Soft, non-tender, non-distended EXTREMITIES:  No edema; No deformity   ASSESSMENT AND PLAN: .    Palpitations She denies significant palpitations. Lopressor  has fallen off the medication list, she is unsure if she is taking. I will restart Lopressor  at a lower dose for palpitations,/HTN. Start Lopressor  50mg  BID.   HTN BP is reasonable. Start Lopressor  50mg  BID as above. Suspect this will continue to improve with weight loss.   Obesity She is s/p bypass surgery. She has lost 20-30lbs.       Dispo: 6 month follow-up  Signed, Sarai January Rebekah Canada, PA-C

## 2024-01-17 NOTE — Patient Instructions (Signed)
 Medication Instructions:  Your physician recommends the following medication changes.  START TAKING: Lopressor  50 mg two times a day  *If you need a refill on your cardiac medications before your next appointment, please call your pharmacy*  Lab Work: No labs ordered today  If you have labs (blood work) drawn today and your tests are completely normal, you will receive your results only by: MyChart Message (if you have MyChart) OR A paper copy in the mail If you have any lab test that is abnormal or we need to change your treatment, we will call you to review the results.  Testing/Procedures: No test ordered today   Follow-Up: At [facility], you and your health needs are our priority.  As part of our continuing mission to provide you with exceptional heart care, our providers are all part of one team.  This team includes your primary Cardiologist (physician) and Advanced Practice Providers or APPs (Physician Assistants and Nurse Practitioners) who all work together to provide you with the care you need, when you need it.  Your next appointment:   6 month(s)  Provider:   Toribio Frees, PA-C

## 2024-01-25 DIAGNOSIS — K9189 Other postprocedural complications and disorders of digestive system: Secondary | ICD-10-CM | POA: Insufficient documentation

## 2024-06-26 ENCOUNTER — Ambulatory Visit (INDEPENDENT_AMBULATORY_CARE_PROVIDER_SITE_OTHER): Admitting: Family Medicine

## 2024-06-26 ENCOUNTER — Encounter: Payer: Self-pay | Admitting: Family Medicine

## 2024-06-26 VITALS — BP 118/82 | HR 70 | Temp 98.1°F | Ht 66.0 in | Wt 295.0 lb

## 2024-06-26 DIAGNOSIS — M722 Plantar fascial fibromatosis: Secondary | ICD-10-CM

## 2024-06-26 MED ORDER — MELOXICAM 15 MG PO TABS: 15.0000 mg | ORAL_TABLET | Freq: Every day | ORAL | 0 refills | Status: DC

## 2024-06-30 NOTE — Assessment & Plan Note (Signed)
 History of Present Illness Ashley Herring is a 43 year old female who presents with bilateral foot pain radiating to the calves.  Bilateral foot pain with calf radiation - Bilateral foot pain radiating to the calves, left side more affected than right - Pain described as burning in quality - Pain present throughout the day, worsening as the day progresses - Pain is particularly pronounced after sitting for approximately fifteen minutes and then standing up - No worsening of pain with the first step out of bed in the morning - No relief with rest or coming off her feet - Cold compresses have been ineffective - No associated numbness or tingling - No current medication regimen for foot pain  History of plantar fascia injections - Received injections to the plantar fascia in April of the previous year - Associates onset of current symptoms with these injections - No relief of symptoms following injections  Chronic back pain - Chronic back pain present - No significant change in back pain with onset of current foot symptoms  Physical Exam BILATERAL FEET AND ANKLES INSPECTION: No visible deformity, erythema, or swelling. Normal alignment of both feet and ankles. PALPATION: Tenderness localized to the anteromedial aspect of the calcaneus, right greater than left. Mild tenderness at the right Achilles tendon and symmetric mild tenderness at the left Achilles. No palpable nodules or defects along either tendon. RANGE OF MOTION: Full and symmetric dorsiflexion and plantarflexion bilaterally with mild discomfort near end range on the right. STRENGTH: 5/5 strength in dorsiflexion, plantarflexion, inversion, and eversion bilaterally. NEUROVASCULAR: Sensation intact to light touch in bilateral feet and ankles. Capillary refill less than 2 seconds. Negative straight leg raise bilaterally. SPECIAL TESTS: Sebastian test negative bilaterally. No evidence of instability.   Results RADIOLOGY Foot  X-ray: No acute fracture or dislocation noted.  Assessment and Plan Bilateral plantar fasciitis Chronic bilateral plantar fasciitis with persistent pain, worse in the left foot. Recurrence of previous plantar fasciitis. - Prescribed anti-inflammatory medication once daily with the largest meal. Monitor for gastrointestinal symptoms and discontinue if symptoms occur. - Schedule close follow-up for possible escalation to bilateral plantar fasciitis injections, consider if meloxicam course ineffective.  Bilateral Achilles tendinopathy Mild tenderness at the Achilles tendon on the right and symmetric tenderness on the left.

## 2024-06-30 NOTE — Patient Instructions (Signed)
 VISIT SUMMARY:  Today, we discussed your ongoing bilateral foot pain that radiates to your calves, with a focus on your plantar fasciitis and Achilles tendinopathy. We reviewed your history of plantar fascia injections and chronic back pain.  YOUR PLAN:  BILATERAL PLANTAR FASCIITIS: You have chronic bilateral plantar fasciitis with persistent pain, especially in your left foot. This is a recurrence of your previous plantar fasciitis. -Take the prescribed anti-inflammatory medication once daily with your largest meal. Monitor for any gastrointestinal symptoms and stop taking the medication if you experience any. -We will schedule a close follow-up and consider injections if the oral medication is ineffective.  BILATERAL ACHILLES TENDINOPATHY: You have mild tenderness at the Achilles tendon on your right foot and symmetric tenderness on your left foot. -Continue monitoring your symptoms and apply cold compresses as needed.

## 2024-06-30 NOTE — Progress Notes (Signed)
 Primary Care / Sports Medicine Office Visit  Patient Information:  Patient ID: Ashley Herring, female DOB: 09/13/1980 Age: 43 y.o. MRN: [id number]   Ashley Herring is a pleasant 43 y.o. female presenting with the following:  Chief Complaint  Patient presents with   Foot Pain    Bil feet pain radiating up to calf's.  Pain is worse in the left foot than right. Sometimes has burning sensation.Patient had injections in the past which helped  a lot. She has noticed that the pain has been constant since Sept.    Vitals:   06/26/24 1611  BP: 118/82  Pulse: 70  Temp: 98.1 F (36.7 C)  SpO2: 99%   Vitals:   06/26/24 1611  Weight: 295 lb (133.8 kg)  Height: 5' 6 (1.676 m)   Body mass index is 47.61 kg/m.  No results found.   Discussed the use of AI scribe software for clinical note transcription with the patient, who gave verbal consent to proceed.   Independent interpretation of notes and tests performed by another provider:   None  Procedures performed:   None  Pertinent History, Exam, Impression, and Recommendations:   Problem List Items Addressed This Visit     Bilateral plantar fasciitis - Primary   History of Present Illness Ashley Herring is a 43 year old female who presents with bilateral foot pain radiating to the calves.  Bilateral foot pain with calf radiation - Bilateral foot pain radiating to the calves, left side more affected than right - Pain described as burning in quality - Pain present throughout the day, worsening as the day progresses - Pain is particularly pronounced after sitting for approximately fifteen minutes and then standing up - No worsening of pain with the first step out of bed in the morning - No relief with rest or coming off her feet - Cold compresses have been ineffective - No associated numbness or tingling - No current medication regimen for foot pain  History of plantar fascia injections - Received  injections to the plantar fascia in April of the previous year - Associates onset of current symptoms with these injections - No relief of symptoms following injections  Chronic back pain - Chronic back pain present - No significant change in back pain with onset of current foot symptoms  Physical Exam BILATERAL FEET AND ANKLES INSPECTION: No visible deformity, erythema, or swelling. Normal alignment of both feet and ankles. PALPATION: Tenderness localized to the anteromedial aspect of the calcaneus, right greater than left. Mild tenderness at the right Achilles tendon and symmetric mild tenderness at the left Achilles. No palpable nodules or defects along either tendon. RANGE OF MOTION: Full and symmetric dorsiflexion and plantarflexion bilaterally with mild discomfort near end range on the right. STRENGTH: 5/5 strength in dorsiflexion, plantarflexion, inversion, and eversion bilaterally. NEUROVASCULAR: Sensation intact to light touch in bilateral feet and ankles. Capillary refill less than 2 seconds. Negative straight leg raise bilaterally. SPECIAL TESTS: Sebastian test negative bilaterally. No evidence of instability.   Results RADIOLOGY Foot X-ray: No acute fracture or dislocation noted.  Assessment and Plan Bilateral plantar fasciitis Chronic bilateral plantar fasciitis with persistent pain, worse in the left foot. Recurrence of previous plantar fasciitis. - Prescribed anti-inflammatory medication once daily with the largest meal. Monitor for gastrointestinal symptoms and discontinue if symptoms occur. - Schedule close follow-up for possible escalation to bilateral plantar fasciitis injections, consider if meloxicam course ineffective.  Bilateral Achilles tendinopathy Mild tenderness at the Achilles tendon on  the right and symmetric tenderness on the left.      Relevant Medications   meloxicam (MOBIC) 15 MG tablet     Orders & Medications Medications:  Meds ordered this  encounter  Medications   meloxicam (MOBIC) 15 MG tablet    Sig: Take 1 tablet (15 mg total) by mouth daily.    Dispense:  30 tablet    Refill:  0   No orders of the defined types were placed in this encounter.    No follow-ups on file.     Ashley JINNY Ku, MD, [facility]   Primary Care Sports Medicine Primary Care and Sports Medicine at [facility]

## 2024-07-03 ENCOUNTER — Encounter: Payer: Self-pay | Admitting: Family Medicine

## 2024-07-03 ENCOUNTER — Other Ambulatory Visit (INDEPENDENT_AMBULATORY_CARE_PROVIDER_SITE_OTHER): Payer: Self-pay | Admitting: Radiology

## 2024-07-03 ENCOUNTER — Ambulatory Visit (INDEPENDENT_AMBULATORY_CARE_PROVIDER_SITE_OTHER): Admitting: Family Medicine

## 2024-07-03 VITALS — BP 112/66 | HR 68 | Ht 66.0 in | Wt 295.0 lb

## 2024-07-03 DIAGNOSIS — Z0000 Encounter for general adult medical examination without abnormal findings: Secondary | ICD-10-CM

## 2024-07-03 DIAGNOSIS — M722 Plantar fascial fibromatosis: Secondary | ICD-10-CM

## 2024-07-03 MED ORDER — TRIAMCINOLONE ACETONIDE 40 MG/ML IJ SUSP
80.0000 mg | Freq: Once | INTRAMUSCULAR | Status: AC
  Administered 2024-07-03: 80 mg via INTRAMUSCULAR

## 2024-07-03 NOTE — Assessment & Plan Note (Addendum)
 History of Present Illness Ashley Herring is a 43 year old female who presents with bilateral heel pain requesting cortisone injections.  Bilateral heel pain - Persistent bilateral heel pain localized to the plantar fascia region - Pain characterized as soreness - Cortisone injections have provided relief in the past  Response to medications - Currently taking meloxicam with only partial alleviation of symptoms  Physical Exam INSPECTION: Tenderness on palpation of the anteromedial aspect of calcaneus bilaterally.  Assessment and Plan Bilateral plantar fasciitis Chronic inflammation and tightness of plantar fascia causing heel pain. Cortisone injections for symptom management and facilitation of stretching and strengthening. Emphasized injections are not curative. Discussed importance of addressing tight fascia to prevent recurrence. - Administer cortisone injections to both heels under ultrasound guidance. - Advise relative rest for two days post-injection, avoiding prolonged standing or walking. - Instruct to ice the affected area for 20 minutes as tolerated, especially before bedtime. - Continue meloxicam until cortisone effects are noticeable, then use as needed. - Provide home exercises for plantar fascia stretching and strengthening via MyChart and printout. - Advise against using a boot unless symptoms persist despite other treatments.

## 2024-07-03 NOTE — Patient Instructions (Signed)
 VISIT SUMMARY:  Today, you received cortisone injections in both heels to help manage your chronic heel pain caused by plantar fasciitis. We discussed the importance of stretching and strengthening exercises to prevent recurrence.  YOUR PLAN:  BILATERAL PLANTAR FASCIITIS: Chronic inflammation and tightness of the plantar fascia causing heel pain. -Administered cortisone injections to both heels. -Rest for two days after the injection, avoiding prolonged standing or walking. -Ice the affected area for 20 minutes as tolerated, especially before bedtime. -Continue taking meloxicam until the cortisone effects are noticeable, then use as needed. -Follow the home exercises for plantar fascia stretching and strengthening provided via MyChart and printout.

## 2024-07-03 NOTE — Progress Notes (Signed)
 Primary Care / Sports Medicine Office Visit  Patient Information:  Patient ID: Ashley Herring, female DOB: [birth year] Age: 43 y.o. MRN: [id number]   Ashley Herring is a pleasant 42 y.o. female presenting with the following:  Chief Complaint  Patient presents with   Foot Pain    Bil foot pain patient would like cortisone injections for foot pain today.    Vitals:   07/03/24 1534  BP: 112/66  Pulse: 68  SpO2: 99%   Vitals:   07/03/24 1534  Weight: 295 lb (133.8 kg)  Height: 5' 6 (1.676 m)   Body mass index is 47.61 kg/m.  No results found.   Discussed the use of AI scribe software for clinical note transcription with the patient, who gave verbal consent to proceed.   Independent interpretation of notes and tests performed by another provider:   None  Procedures performed:   Procedure:  Injection of right heel under ultrasound guidance. Ultrasound guidance utilized for medial to lateral approach at medial calcaneus Samsung HS60 device utilized with permanent recording / reporting. Verbal informed consent obtained and verified. Skin prepped in a sterile fashion. Ethyl chloride for topical local analgesia.  Completed without difficulty and tolerated well. Medication: triamcinolone  acetonide 40 mg/mL suspension for injection 1 mL total and 2 mL lidocaine  1% without epinephrine  utilized for needle placement anesthetic Advised to contact for fevers/chills, erythema, induration, drainage, or persistent bleeding.  Procedure:  Injection of left heel under ultrasound guidance. Ultrasound guidance utilized for medial to lateral approach at medial calcaneus Samsung HS60 device utilized with permanent recording / reporting. Verbal informed consent obtained and verified. Skin prepped in a sterile fashion. Ethyl chloride for topical local analgesia.  Completed without difficulty and tolerated well. Medication: triamcinolone  acetonide 40 mg/mL suspension for  injection 1 mL total and 2 mL lidocaine  1% without epinephrine  utilized for needle placement anesthetic Advised to contact for fevers/chills, erythema, induration, drainage, or persistent bleeding.   Pertinent History, Exam, Impression, and Recommendations:   Problem List Items Addressed This Visit     Bilateral plantar fasciitis - Primary   History of Present Illness Ashley Herring is a 43 year old female who presents with bilateral heel pain requesting cortisone injections.  Bilateral heel pain - Persistent bilateral heel pain localized to the plantar fascia region - Pain characterized as soreness - Cortisone injections have provided relief in the past  Response to medications - Currently taking meloxicam with only partial alleviation of symptoms  Physical Exam INSPECTION: Tenderness on palpation of the anteromedial aspect of calcaneus bilaterally.  Assessment and Plan Bilateral plantar fasciitis Chronic inflammation and tightness of plantar fascia causing heel pain. Cortisone injections for symptom management and facilitation of stretching and strengthening. Emphasized injections are not curative. Discussed importance of addressing tight fascia to prevent recurrence. - Administer cortisone injections to both heels under ultrasound guidance. - Advise relative rest for two days post-injection, avoiding prolonged standing or walking. - Instruct to ice the affected area for 20 minutes as tolerated, especially before bedtime. - Continue meloxicam until cortisone effects are noticeable, then use as needed. - Provide home exercises for plantar fascia stretching and strengthening via MyChart and printout. - Advise against using a boot unless symptoms persist despite other treatments.      Relevant Orders   US  LIMITED JOINT SPACE STRUCTURES LOW BILAT   Other Visit Diagnoses       Healthcare maintenance       Relevant Orders   CBC   Comprehensive  metabolic panel with GFR    Hemoglobin A1c   Lipid panel        Orders & Medications Medications:  Meds ordered this encounter  Medications   triamcinolone  acetonide (KENALOG -40) injection 80 mg   Orders Placed This Encounter  Procedures   US  LIMITED JOINT SPACE STRUCTURES LOW BILAT   CBC   Comprehensive metabolic panel with GFR   Hemoglobin A1c   Lipid panel     Return if symptoms worsen or fail to improve.     Ashley JINNY Ku, MD, [facility]   Primary Care Sports Medicine Primary Care and Sports Medicine at [facility]

## 2024-08-12 DIAGNOSIS — K8020 Calculus of gallbladder without cholecystitis without obstruction: Secondary | ICD-10-CM | POA: Insufficient documentation

## 2024-08-14 ENCOUNTER — Ambulatory Visit: Payer: Self-pay | Admitting: *Deleted

## 2024-08-14 NOTE — Telephone Encounter (Signed)
 Pt has appt.  KP

## 2024-08-14 NOTE — Telephone Encounter (Signed)
 FYI Only or Action Required?: FYI only for provider: appointment scheduled on 08/15/24.  Patient was last seen in primary care on 07/03/2024 by Ashley Selinda PARAS, MD.  Called Nurse Triage reporting Pain.  Symptoms began several days ago. 3 days ago   Interventions attempted: OTC medications: tylenol  with no relief .  Symptoms are: gradually worsening.  Triage Disposition: See PCP When Office is Open (Within 3 Days)  Patient/caregiver understands and will follow disposition?: Yes             Copied from CRM #[id number]. Topic: Clinical - Red Word Triage >> Aug 14, 2024 10:23 AM Antwanette L wrote: Red Word that prompted transfer to Nurse Triage: The patient is experiencing nerve pain localized to the ear, breast, fingertips, and toes. Reason for Disposition  [1] MODERATE pain (e.g., interferes with normal activities) AND [2] present > 3 days  Answer Assessment - Initial Assessment Questions Appt scheduled for tomorrow. Recommended if worsening sx go to ED.      1. ONSET: When did the muscle aches or body pains start?      Body pains dull sensation, ache 2. LOCATION: What part of your body is hurting? (e.g., entire body, arms, legs)      Bilateral fingertips, toes, breasts, ears, back , inner thighs 3. SEVERITY: How bad is the pain? (Scale 1-10; or mild, moderate, severe)     Moderate  4. CAUSE: What do you think is causing the pains?     Has had in toes before but did not stay long  5. FEVER: Do you have a fever? If Yes, ask: What is your temperature, how was it measured, and  when did it start?      No  6. OTHER SYMPTOMS: Do you have any other symptoms? (e.g., chest pain, cold or flu symptoms, rash, weakness, weight loss)     Dull aches fingertips , toes, bilateral breasts , outside of ears, inner thighs, back. Denies chest pain no difficulty breathing no fever no N/T. No weakness on either side of body. No burning sensations.  7. PREGNANCY: Is there any  chance you are pregnant? When was your last menstrual period?     Na  8. TRAVEL: Have you traveled out of the country in the last month? (e.g., exposures, travel history)     na  Protocols used: Muscle Aches and Body Pain-A-AH

## 2024-08-15 ENCOUNTER — Ambulatory Visit: Admitting: Family Medicine

## 2024-08-18 ENCOUNTER — Ambulatory Visit: Admitting: Family Medicine

## 2024-08-18 ENCOUNTER — Encounter: Payer: Self-pay | Admitting: Family Medicine

## 2024-08-18 VITALS — BP 116/80 | HR 87 | Ht 66.0 in | Wt 288.8 lb

## 2024-08-18 DIAGNOSIS — M2550 Pain in unspecified joint: Secondary | ICD-10-CM | POA: Insufficient documentation

## 2024-08-18 DIAGNOSIS — E559 Vitamin D deficiency, unspecified: Secondary | ICD-10-CM | POA: Diagnosis not present

## 2024-08-18 DIAGNOSIS — E569 Vitamin deficiency, unspecified: Secondary | ICD-10-CM | POA: Insufficient documentation

## 2024-08-18 DIAGNOSIS — N926 Irregular menstruation, unspecified: Secondary | ICD-10-CM | POA: Insufficient documentation

## 2024-08-18 DIAGNOSIS — D500 Iron deficiency anemia secondary to blood loss (chronic): Secondary | ICD-10-CM | POA: Diagnosis not present

## 2024-08-18 DIAGNOSIS — B349 Viral infection, unspecified: Secondary | ICD-10-CM | POA: Insufficient documentation

## 2024-08-18 LAB — POCT INFLUENZA A/B
Influenza A, POC: NEGATIVE
Influenza B, POC: NEGATIVE

## 2024-08-18 LAB — POC COVID19 BINAXNOW: SARS Coronavirus 2 Ag: NEGATIVE

## 2024-08-18 MED ORDER — DICLOFENAC POTASSIUM 50 MG PO TABS: 50.0000 mg | ORAL_TABLET | Freq: Three times a day (TID) | ORAL | 0 refills | Status: AC | PRN

## 2024-08-18 MED ORDER — VITAMIN D (ERGOCALCIFEROL) 1.25 MG (50000 UNIT) PO CAPS: 50000.0000 [IU] | ORAL_CAPSULE | ORAL | 0 refills | Status: AC

## 2024-08-18 NOTE — Progress Notes (Signed)
 Primary Care / Sports Medicine Office Visit  Patient Information:  Patient ID: Ashley Herring, female DOB: [birth year] Age: 43 y.o. MRN: [id number]   Ashley Herring is a pleasant 43 y.o. female presenting with the following:  Chief Complaint  Patient presents with   Generalized Body Aches    Patient having aches in her fingers, ties, chest , and outer ear, with tingling in her back since Wednesday 08/13/24. She has taken Tylenol  and some pain pill she had remaining from her foot surgery and it did not help. She is also smelling a chlorine scent which started on the Monday or Tuesday before she began to have the pains.     Vitals:   08/18/24 0828  BP: 116/80  Pulse: 87  SpO2: 99%   Vitals:   08/18/24 0828  Weight: 288 lb 12.8 oz (131 kg)  Height: 5' 6 (1.676 m)   Body mass index is 46.61 kg/m.  No results found.   Discussed the use of AI scribe software for clinical note transcription with the patient, who gave verbal consent to proceed.   Independent interpretation of notes and tests performed by another provider:   None  Procedures performed:   None  Pertinent History, Exam, Impression, and Recommendations:   History of Present Illness Ashley Herring is a 43 year old female who presents with changes in sense of smell and fingertip pain.  Olfactory disturbance - Change in sense of smell since last week - Intermittent perception of chlorine odor - No identifiable triggers - No associated increased secretions or drainage  Distal extremity pain - Throbbing pain localized to fingertips and toes - Pain resembles sensation after cutting a nail too short - Accompanied by sensitivity to sound - No sensitivity to touch  Hoarseness - Onset of hoarseness began on Sunday - Recent exposure to son's illness - No fever, chills, muscle aches, breathing issues, or sinus pressure  Symptom management - Tylenol  taken without relief - Meloxicam  provided  temporary relief for a few hours - No recent initiation of new medications  Relevant medical history - Low iron and vitamin deficiencies (vitamin D  and B vitamins) identified in lab work on November 25th - Irregular menstrual cycles, possibly contributing to low iron - No bleeding per rectum reported - Scheduled for gallbladder surgery on December 26th  Physical Exam HEENT: Oroipharynx benign. Nasal mucosa erythematous. Tympanic membranes dull. Canals benign. NECK: Cervical lymphadenopathy bilaterally. CHEST: Lungs clear to auscultation bilaterally. CARDIOVASCULAR: Heart sounds normal.  Results LABS (outside [facility] labs reviewed) Vitamin K: normal (07/29/2024) Iron: 40 (07/29/2024) Ferritin: 5.2 (07/29/2024) Vitamin D : 7.8 (07/29/2024) Vitamin B1: low (07/29/2024) Vitamin B12: normal (07/29/2024) Vitamin B6: normal (07/29/2024) Zinc: normal (07/29/2024) Prealbumin: normal (07/29/2024) Vitamin A: normal (07/29/2024) Vitamin E: normal (07/29/2024) Complete blood count: 3.74 (07/29/2024) Comprehensive metabolic panel: normal (07/29/2024) Folate: normal (07/29/2024) Hemoglobin A1c: normal (07/29/2024)  POC COVID and flu: negative  Assessment and Plan Viral upper respiratory infection Likely viral etiology given altered sense of smell and fingertip pain. Negative COVID and influenza. - Prescribed diclofenac  for pain management, up to three times daily as needed. - Advised supportive care including hydration and rest. - Recommended vitamins D, C, and zinc for immune support.  Iron deficiency anemia Signs of anemia with low hemoglobin and ferritin. Possible menstrual irregularity contributing. Discussed importance of iron for energy and oxygen transport. Potential need for hematology referral for further evaluation and possible iron infusion. - Referred to hematology for further evaluation and management. - Prescribed  over-the-counter iron supplements. - Advised taking iron  supplements every other day or on a suitable schedule. - Recommended taking iron with orange juice to enhance absorption.  Vitamin D  deficiency Vitamin D  level very low at 7.8. Discussed role in mood and energy levels. - Prescribed high-dose vitamin D , once weekly for eight weeks.  Vitamin B1 deficiency Vitamin B1 is low, affecting mood and energy levels. - Recommended a multivitamin to address vitamin B1 deficiency.  Menstrual irregularity Possibly contributing to iron deficiency anemia. Discussed need for gynecological evaluation to assess and manage menstrual irregularity. - Referred to gynecology for evaluation of menstrual irregularity. - Advised discussing low blood count and iron levels with gynecologist.  Problem List Items Addressed This Visit     Iron deficiency anemia due to chronic blood loss   Relevant Medications   Ferrous Sulfate  (IRON PO)   Other Relevant Orders   Ambulatory referral to Hematology / Oncology   Ambulatory referral to Obstetrics / Gynecology   Irregular menses   Relevant Orders   Ambulatory referral to Obstetrics / Gynecology   Polyarthralgia   Viral illness - Primary   Relevant Orders   POC COVID-19 BinaxNow (Completed)   POCT Influenza A/B (Completed)   Vitamin D  deficiency   Relevant Medications   Vitamin D , Ergocalciferol , (DRISDOL ) 1.25 MG (50000 UNIT) CAPS capsule   Vitamin deficiency     Orders & Medications Medications:  Meds ordered this encounter  Medications   Vitamin D , Ergocalciferol , (DRISDOL ) 1.25 MG (50000 UNIT) CAPS capsule    Sig: Take 1 capsule (50,000 Units total) by mouth every 7 (seven) days. Take for 8 total doses(weeks)    Dispense:  8 capsule    Refill:  0   Orders Placed This Encounter  Procedures   Ambulatory referral to Hematology / Oncology   Ambulatory referral to Obstetrics / Gynecology   POC COVID-19 BinaxNow   POCT Influenza A/B     No follow-ups on file.     Selinda JINNY Ku, MD, [facility]    Primary Care Sports Medicine Primary Care and Sports Medicine at [facility]

## 2024-08-18 NOTE — Patient Instructions (Signed)
 VISIT SUMMARY:  During your visit, we discussed your recent changes in sense of smell, fingertip pain, and hoarseness. We also reviewed your iron deficiency anemia, vitamin D  and B1 deficiencies, and menstrual irregularity.  YOUR PLAN:  VIRAL UPPER RESPIRATORY INFECTION: You likely have a viral infection, which is causing your altered sense of smell and fingertip pain. -We ordered COVID-19 and flu tests to confirm the cause of your symptoms. -You are prescribed diclofenac  for pain management, which you can take up to three times daily as needed. -Supportive care including hydration and rest is recommended. -Take vitamins D, C, and zinc to support your immune system.  IRON DEFICIENCY ANEMIA: You have low iron levels, which can cause fatigue and other symptoms. This may be related to your irregular menstrual cycles. -You are referred to hematology for further evaluation and management. -Take over-the-counter iron supplements every other day or on a suitable schedule.  VITAMIN D  DEFICIENCY: Your vitamin D  levels are very low, which can affect your mood and energy levels. -You are prescribed high-dose vitamin D  to take once weekly for eight weeks.  VITAMIN B1 DEFICIENCY: Your vitamin B1 levels are low, which can affect your mood and energy levels. -Take a multivitamin to address the vitamin B1 deficiency.  MENSTRUAL IRREGULARITY: Your irregular menstrual cycles may be contributing to your iron deficiency anemia. -You are referred to gynecology for evaluation of your menstrual irregularity. -Discuss your low blood count and iron levels with your gynecologist.

## 2024-08-25 ENCOUNTER — Encounter: Payer: Self-pay | Admitting: Oncology

## 2024-08-25 ENCOUNTER — Inpatient Hospital Stay: Attending: Oncology | Admitting: Oncology

## 2024-08-25 ENCOUNTER — Inpatient Hospital Stay

## 2024-08-25 VITALS — BP 134/72 | HR 74 | Temp 98.6°F | Resp 20 | Wt 287.7 lb

## 2024-08-25 DIAGNOSIS — Z8349 Family history of other endocrine, nutritional and metabolic diseases: Secondary | ICD-10-CM | POA: Diagnosis not present

## 2024-08-25 DIAGNOSIS — Z79899 Other long term (current) drug therapy: Secondary | ICD-10-CM | POA: Insufficient documentation

## 2024-08-25 DIAGNOSIS — Z8249 Family history of ischemic heart disease and other diseases of the circulatory system: Secondary | ICD-10-CM | POA: Diagnosis not present

## 2024-08-25 DIAGNOSIS — Z808 Family history of malignant neoplasm of other organs or systems: Secondary | ICD-10-CM | POA: Insufficient documentation

## 2024-08-25 DIAGNOSIS — Z8759 Personal history of other complications of pregnancy, childbirth and the puerperium: Secondary | ICD-10-CM | POA: Insufficient documentation

## 2024-08-25 DIAGNOSIS — Z8719 Personal history of other diseases of the digestive system: Secondary | ICD-10-CM | POA: Diagnosis not present

## 2024-08-25 DIAGNOSIS — R5383 Other fatigue: Secondary | ICD-10-CM | POA: Insufficient documentation

## 2024-08-25 DIAGNOSIS — Z800 Family history of malignant neoplasm of digestive organs: Secondary | ICD-10-CM | POA: Insufficient documentation

## 2024-08-25 DIAGNOSIS — R700 Elevated erythrocyte sedimentation rate: Secondary | ICD-10-CM | POA: Insufficient documentation

## 2024-08-25 DIAGNOSIS — Z9884 Bariatric surgery status: Secondary | ICD-10-CM | POA: Insufficient documentation

## 2024-08-25 DIAGNOSIS — K912 Postsurgical malabsorption, not elsewhere classified: Secondary | ICD-10-CM | POA: Diagnosis not present

## 2024-08-25 DIAGNOSIS — Z91010 Allergy to peanuts: Secondary | ICD-10-CM | POA: Insufficient documentation

## 2024-08-25 DIAGNOSIS — K9589 Other complications of other bariatric procedure: Secondary | ICD-10-CM | POA: Diagnosis not present

## 2024-08-25 DIAGNOSIS — D509 Iron deficiency anemia, unspecified: Secondary | ICD-10-CM | POA: Insufficient documentation

## 2024-08-25 DIAGNOSIS — Z803 Family history of malignant neoplasm of breast: Secondary | ICD-10-CM | POA: Insufficient documentation

## 2024-08-25 DIAGNOSIS — Z833 Family history of diabetes mellitus: Secondary | ICD-10-CM | POA: Insufficient documentation

## 2024-08-25 DIAGNOSIS — Z825 Family history of asthma and other chronic lower respiratory diseases: Secondary | ICD-10-CM | POA: Insufficient documentation

## 2024-08-25 DIAGNOSIS — D508 Other iron deficiency anemias: Secondary | ICD-10-CM | POA: Insufficient documentation

## 2024-08-25 NOTE — Progress Notes (Signed)
 "  Hematology/Oncology Consult note [facility] [phone] [phone]  Patient Care Team: Alvia Selinda PARAS, MD as PCP - General (Family Medicine) Darron Deatrice LABOR, MD as PCP - Cardiology (Cardiology)   Name of the patient: Ashley Herring  [id number]  1981/02/23    Reason for referral-iron deficiency anemia   Referring physician-Dr. 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  She has been referred for iron deficiency anemia.  Labs from 07/29/2024 showed white count of 7.3, H&H of 10.4/31 with an MCV of 82.7 and a platelet count of 398.  Ferritin levels were low at 5.2.  Folate levels were normal.  Iron studies showed iron saturation of 10%.  B12 levels were not low normal at 395.  Looking back at her prior CBCs her hemoglobin has been mostly between 11.5-12 until August 2025.  She underwent gastric bypass surgery on December 19, 2023, and subsequently required four endoscopic dilations for a postoperative stricture between June and August.  Since surgery, her most recent hemoglobin was 10.4 g/dL (baseline ~87 g/dL) and ferritin was 5 ng/mL. She is currently taking bariatric multivitamins as recommended postoperatively.  Her primary symptom is persistent fatigue. She denies heavy menstrual bleeding, gastrointestinal bleeding, melena, hematochezia, and NSAID use. Over the past two months, she experienced two consecutive menstrual cycles, which is atypical for her, described as prolonged with dark blood but not heavy, each lasting three to four days. She denies unintentional weight loss and uses acetaminophen  as needed for pain.  She is scheduled for cholecystectomy later this week.     ECOG PS- 1  Pain scale- 0   Review  of systems- Review of Systems  Constitutional:  Positive for malaise/fatigue. Negative for chills, fever and weight loss.  HENT:  Negative for congestion, ear discharge and nosebleeds.   Eyes:  Negative for blurred vision.  Respiratory:  Negative for cough, hemoptysis, sputum production, shortness of breath and wheezing.   Cardiovascular:  Negative for chest pain, palpitations, orthopnea and claudication.  Gastrointestinal:  Negative for abdominal pain, blood in stool, constipation, diarrhea, heartburn, melena, nausea and vomiting.  Genitourinary:  Negative for dysuria, flank pain, frequency, hematuria and urgency.  Musculoskeletal:  Negative for back pain, joint pain and myalgias.  Skin:  Negative for rash.  Neurological:  Negative for dizziness, tingling, focal weakness, seizures, weakness and headaches.  Endo/Heme/Allergies:  Does not bruise/bleed easily.  Psychiatric/Behavioral:  Negative for depression and suicidal ideas. The patient does not have insomnia.     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   S/P gastric bypass 12/19/2023   Blurred vision, bilateral 10/10/2023   Hidradenitis suppurativa of left axilla 03/23/2023   Snoring 02/08/2023   History of tobacco abuse 02/08/2023   Right carpal tunnel syndrome 10/27/2022   Bilateral plantar fasciitis 10/27/2022   Psychophysiologic insomnia 08/15/2022   Anxiety and depression 05/29/2022   Hypertension 05/29/2022   IBS (irritable bowel syndrome)    BMI 50.0-59.9, adult (HCC) 04/24/2019  Hyperlipidemia 04/04/2019   Elevated erythrocyte sedimentation rate 03/25/2019    Scleritis of left eye 03/25/2019   DDD (degenerative disc disease), lumbar 01/31/2019   Chronic left-sided low back pain with left-sided sciatica 01/31/2019   Extreme obesity 01/31/2019     Past Medical History:  Diagnosis Date   Back pain    Breast lump 01/31/2019   Eye inflammation    Miscarriage    Nipple problem 07/10/2019   Plantar fasciitis, bilateral 10/27/2022   Sciatica      Past Surgical History:  Procedure Laterality Date   COLONOSCOPY WITH PROPOFOL  N/A 02/08/2021   Procedure: COLONOSCOPY WITH PROPOFOL ;  Surgeon: Jinny Carmine, MD;  Location: [facility]COPY;  Service: Endoscopy;  Laterality: N/A;   HEMORRHOID SURGERY      Social History   Socioeconomic History   Marital status: Married    Spouse name: Sherwood   Number of children: 2   Years of education: Not on file   Highest education level: Not on file  Occupational History   Not on file  Tobacco Use   Smoking status: Former    Current packs/day: 0.00    Types: Cigarettes    Quit date: 01/25/2023    Years since quitting: 1.5   Smokeless tobacco: Never   Tobacco comments:    Smoked one cigarette occassionally  Vaping Use   Vaping status: Never Used  Substance and Sexual Activity   Alcohol use: Yes    Comment: occasionally    Drug use: Never   Sexual activity: Yes    Partners: Male    Birth control/protection: None  Other Topics Concern   Not on file  Social History Narrative   Not on file   Social Drivers of Health   Tobacco Use: Medium Risk (08/25/2024)   Patient History    Smoking Tobacco Use: Former    Smokeless Tobacco Use: Never    Passive Exposure: Not on file  Financial Resource Strain: Low Risk (01/25/2024)   [facility] from [facility]   Overall Financial Resource Strain (CARDIA)    Difficulty of Paying Living Expenses: Not hard at all  Food Insecurity: No Food Insecurity (08/25/2024)   Epic    Worried About Radiation Protection Practitioner of Food in the Last Year: Never true    Ran Out of Food  in the Last Year: Never true  Transportation Needs: No Transportation Needs (08/25/2024)   Epic    Lack of Transportation (Medical): No    Lack of Transportation (Non-Medical): No  Physical Activity: Not on file  Stress: Not on file  Social Connections: Not on file  Intimate Partner Violence: Not At Risk (08/25/2024)   Epic    Fear of Current or Ex-Partner: No    Emotionally Abused: No    Physically Abused: No    Sexually Abused: No  Depression (PHQ2-9): Low Risk (08/25/2024)   Depression (PHQ2-9)    PHQ-2 Score: 0  Recent Concern: Depression (PHQ2-9) - Medium Risk (08/18/2024)   Depression (PHQ2-9)    PHQ-2 Score: 8  Alcohol Screen: Not on file  Housing: Low Risk (08/25/2024)   Epic    Unable to Pay for Housing in the Last Year: No    Number of Times Moved in the Last Year: 0    Homeless in the Last Year: No  Utilities: Not At Risk (08/25/2024)   Epic    Threatened with loss of utilities: No  Health Literacy: Not on file     Family History  Problem Relation  Age of Onset   Hypertension Mother    Diabetes Mother    Thyroid disease Mother    Hypertension Father    Diabetes Father    Breast cancer Paternal Aunt 69       Deceased from metastasis   Cancer Maternal Grandmother    Brain cancer Maternal Grandmother    Brain cancer Paternal Grandmother    Colon cancer Paternal Grandfather    Asthma Daughter     Current Medications[2]   Physical exam:  Vitals:   08/25/24 1048  BP: (!) 142/95  Pulse: 67  Resp: 20  Temp: 98.6 F (37 C)  SpO2: 100%  Weight: 287 lb 11.2 oz (130.5 kg)   Physical Exam Cardiovascular:     Rate and Rhythm: Normal rate and regular rhythm.     Heart sounds: Normal heart sounds.  Pulmonary:     Effort: Pulmonary effort is normal.     Breath sounds: Normal breath sounds.  Skin:    General: Skin is warm and dry.  Neurological:     Mental Status: She is alert and oriented to person, place, and time.           Latest Ref Rng &  Units 10/22/2023   12:00 AM  CMP  Glucose 70 - 99 mg/dL 78   BUN 6 - 24 mg/dL 10   Creatinine 9.42 - 1.00 mg/dL 9.34   Sodium 865 - 855 mmol/L 140   Potassium 3.5 - 5.2 mmol/L 4.6   Chloride 96 - 106 mmol/L 103   CO2 20 - 29 mmol/L 26   Calcium  8.7 - 10.2 mg/dL 9.5   Total Protein 6.0 - 8.5 g/dL 6.9   Total Bilirubin 0.0 - 1.2 mg/dL 0.3   Alkaline Phos 44 - 121 IU/L 58   AST 0 - 40 IU/L 16   ALT 0 - 32 IU/L 14       Latest Ref Rng & Units 10/22/2023   12:00 AM  CBC  WBC 3.4 - 10.8 x10E3/uL 9.1   Hemoglobin 11.1 - 15.9 g/dL 87.8   Hematocrit 65.9 - 46.6 % 38.4   Platelets 150 - 450 x10E3/uL 381      Assessment and plan- Patient is a 43 y.o. female referred for iron deficiency anemia likely secondary to gastric bypass surgery  Assessment and Plan    Iron deficiency anemia Iron deficiency anemia likely due to malabsorption post-gastric bypass. Persistent fatigue and low iron stores despite bariatric multivitamins. No signs of bleeding on review of symptoms. Iron repletion expected to improve hemoglobin and fatigue. - Discussed etiology related to gastric bypass and malabsorption.  If there is persistent iron deficiency we will consider GI referral at that time - Planning to give her Venofer 200 mg IV x 5 doses - Reviewed risks of IV iron infusion, including rare allergic reactions. - Reviewed benefits of IV iron infusion, including improved iron stores, hemoglobin, and fatigue. - Confirmed willingness to proceed with IV iron infusion.  -No labs today.  CBC ferritin and iron studies in 2 and 4 months and I will see her back in 4 months   Thank you for this kind referral and the opportunity to participate in the care of this patient   Visit Diagnosis 1. Elevated erythrocyte sedimentation rate   2. Iron deficiency anemia, unspecified iron deficiency anemia type     Dr. Annah Skene, MD, MPH [facility] [phone] 08/25/2024                    [  1]  Allergies Allergen Reactions   Peanuts [Peanut Oil] Swelling  [2]  Current Outpatient Medications:    acetaminophen  (TYLENOL ) 500 MG tablet, Take 1,000 mg by mouth as needed for moderate pain (pain score 4-6)., Disp: , Rfl:    Cholecalciferol (D 5000) 125 MCG (5000 UT) capsule, Take 125 mcg by mouth., Disp: , Rfl:    diclofenac  (CATAFLAM ) 50 MG tablet, Take 1 tablet (50 mg total) by mouth 3 (three) times daily as needed., Disp: 60 tablet, Rfl: 0   Ferrous Sulfate  (IRON PO), Take by mouth., Disp: , Rfl:    meloxicam  (MOBIC ) 15 MG tablet, Take 1 tablet (15 mg total) by mouth daily., Disp: 30 tablet, Rfl: 0   Multiple Vitamin (MULTIVITAMIN WITH MINERALS) TABS tablet, Take 1 tablet by mouth daily., Disp: , Rfl:    Vitamin D , Ergocalciferol , (DRISDOL ) 1.25 MG (50000 UNIT) CAPS capsule, Take 1 capsule (50,000 Units total) by mouth every 7 (seven) days. Take for 8 total doses(weeks), Disp: 8 capsule, Rfl: 0  "

## 2024-08-25 NOTE — Addendum Note (Signed)
 Addended by: MELANEE PIGGS C on: 08/25/2024 01:15 PM   Modules accepted: Orders

## 2024-09-01 ENCOUNTER — Telehealth: Admitting: Emergency Medicine

## 2024-09-01 DIAGNOSIS — S025XXB Fracture of tooth (traumatic), initial encounter for open fracture: Secondary | ICD-10-CM

## 2024-09-01 MED ORDER — PENICILLIN V POTASSIUM 500 MG PO TABS: 500.0000 mg | ORAL_TABLET | Freq: Three times a day (TID) | ORAL | 0 refills | Status: AC

## 2024-09-01 NOTE — Patient Instructions (Signed)
" °  Ashley Herring, thank you for joining Jon CHRISTELLA Belt, NP for today's virtual visit.  While this provider is not your primary care provider (PCP), if your PCP is located in our provider database this encounter information will be shared with them immediately following your visit.   A [facility] MyChart account gives you access to today's visit and all your visits, tests, and labs performed at [facility]  click here if you don't have a [facility] MyChart account or go to [email or web address]  Consent: (Patient) Ashley Herring provided verbal consent for this virtual visit at the beginning of the encounter.  Current Medications:  Current Outpatient Medications:    penicillin  v potassium (VEETID) 500 MG tablet, Take 1 tablet (500 mg total) by mouth 3 (three) times daily for 10 days., Disp: 30 tablet, Rfl: 0   acetaminophen  (TYLENOL ) 500 MG tablet, Take 1,000 mg by mouth as needed for moderate pain (pain score 4-6)., Disp: , Rfl:    Cholecalciferol (D 5000) 125 MCG (5000 UT) capsule, Take 125 mcg by mouth., Disp: , Rfl:    diclofenac  (CATAFLAM ) 50 MG tablet, Take 1 tablet (50 mg total) by mouth 3 (three) times daily as needed., Disp: 60 tablet, Rfl: 0   Ferrous Sulfate  (IRON PO), Take by mouth., Disp: , Rfl:    meloxicam  (MOBIC ) 15 MG tablet, Take 1 tablet (15 mg total) by mouth daily., Disp: 30 tablet, Rfl: 0   Multiple Vitamin (MULTIVITAMIN WITH MINERALS) TABS tablet, Take 1 tablet by mouth daily., Disp: , Rfl:    Vitamin D , Ergocalciferol , (DRISDOL ) 1.25 MG (50000 UNIT) CAPS capsule, Take 1 capsule (50,000 Units total) by mouth every 7 (seven) days. Take for 8 total doses(weeks), Disp: 8 capsule, Rfl: 0   Medications ordered in this encounter:  Meds ordered this encounter  Medications   penicillin  v potassium (VEETID) 500 MG tablet    Sig: Take 1 tablet (500 mg total) by mouth 3 (three) times daily for 10 days.    Dispense:  30 tablet    Refill:  0     *If  you need refills on other medications prior to your next appointment, please contact your pharmacy*  Follow-Up: Call back or seek an in-person evaluation if the symptoms worsen or if the condition fails to improve as anticipated.  [facility] [phone]  Other Instructions  Please call a dentist today for an appt as soon as possible.    If you have been instructed to have an in-person evaluation today at a local Urgent Care facility, please use the link below. It will take you to a list of all of our available [facility], including address, phone number and hours of operation. Please do not delay care.  [facility]  If you or a family member do not have a primary care provider, use the link below to schedule a visit and establish care. When you choose a [place] primary care physician or advanced practice provider, you gain a long-term partner in health. Find a Primary Care Provider  Learn more about [facility]'s in-office and virtual care options:  - Get Care Now  "

## 2024-09-01 NOTE — Progress Notes (Signed)
 " Virtual Visit Consent   Ashley Herring, you are scheduled for a virtual visit with a [facility] provider today. Just as with appointments in the office, your consent must be obtained to participate. Your consent will be active for this visit and any virtual visit you may have with one of our providers in the next 365 days. If you have a MyChart account, a copy of this consent can be sent to you electronically.  As this is a virtual visit, video technology does not allow for your provider to perform a traditional examination. This may limit your provider's ability to fully assess your condition. If your provider identifies any concerns that need to be evaluated in person or the need to arrange testing (such as labs, EKG, etc.), we will make arrangements to do so. Although advances in technology are sophisticated, we cannot ensure that it will always work on either your end or our end. If the connection with a video visit is poor, the visit may have to be switched to a telephone visit. With either a video or telephone visit, we are not always able to ensure that we have a secure connection.  By engaging in this virtual visit, you consent to the provision of healthcare and authorize for your insurance to be billed (if applicable) for the services provided during this visit. Depending on your insurance coverage, you may receive a charge related to this service.  I need to obtain your verbal consent now. Are you willing to proceed with your visit today? Ashley Herring has provided verbal consent on 09/01/2024 for a virtual visit (video or telephone). Jon CHRISTELLA Belt, NP  Date: 09/01/2024 3:52 PM   Virtual Visit via Video Note   I, Jon CHRISTELLA Belt, connected with  Ashley Herring  ([id number], [birth year]) on 09/01/2024 at  3:45 PM EST by a video-enabled telemedicine application and verified that I am speaking with the correct person using two identifiers.  Location: Patient: Virtual Visit  Location Patient: Home Provider: Virtual Visit Location Provider: Home Office   I discussed the limitations of evaluation and management by telemedicine and the availability of in person appointments. The patient expressed understanding and agreed to proceed.    History of Present Illness: Ashley Herring is a 43 y.o. who identifies as a female who was assigned female at birth, and is being seen today for dental infection.  R lower lateral incisor broke 08/27/24 after she ran into refrigerator. Has not called dentist.   Had gall bladder out on 08/29/24.   Has swelling in gum area around broken tooth. Has swished with salt water and with peroxide. Taking tylenol  and oxycodone  for gall bladder. Doesn't do much for dental pain.   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   S/P gastric bypass 12/19/2023   Blurred vision, bilateral 10/10/2023   Hidradenitis suppurativa of left axilla 03/23/2023   Snoring 02/08/2023   History of tobacco abuse 02/08/2023   Right carpal tunnel syndrome 10/27/2022   Bilateral plantar fasciitis 10/27/2022   Psychophysiologic insomnia 08/15/2022   Anxiety and depression 05/29/2022  Hypertension 05/29/2022   IBS (irritable bowel syndrome)    BMI 50.0-59.9, adult (HCC) 04/24/2019   Hyperlipidemia 04/04/2019   Elevated erythrocyte sedimentation rate 03/25/2019   Scleritis of left eye 03/25/2019   DDD (degenerative disc disease), lumbar 01/31/2019   Chronic left-sided low back pain with left-sided sciatica 01/31/2019    Extreme obesity 01/31/2019    Allergies: Allergies[1] Medications: Current Medications[2]  Observations/Objective: Patient is well-developed, well-nourished in no acute distress.  Resting comfortably  at home.  Head is normocephalic, atraumatic.  No labored breathing.  Speech is clear and coherent with logical content.  Patient is alert and oriented at baseline.    Assessment and Plan: 1. Open fracture of tooth, initial encounter (Primary) - penicillin  v potassium (VEETID) 500 MG tablet; Take 1 tablet (500 mg total) by mouth 3 (three) times daily for 10 days.  Dispense: 30 tablet; Refill: 0  I advised her to call her dentist right now for appt. She does have a dentist.   Follow Up Instructions: I discussed the assessment and treatment plan with the patient. The patient was provided an opportunity to ask questions and all were answered. The patient agreed with the plan and demonstrated an understanding of the instructions.  A copy of instructions were sent to the patient via MyChart unless otherwise noted below.    The patient was advised to call back or seek an in-person evaluation if the symptoms worsen or if the condition fails to improve as anticipated.    Jon CHRISTELLA Belt, NP    [1]  Allergies Allergen Reactions   Peanuts [Peanut Oil] Swelling  [2]  Current Outpatient Medications:    penicillin  v potassium (VEETID) 500 MG tablet, Take 1 tablet (500 mg total) by mouth 3 (three) times daily for 10 days., Disp: 30 tablet, Rfl: 0   acetaminophen  (TYLENOL ) 500 MG tablet, Take 1,000 mg by mouth as needed for moderate pain (pain score 4-6)., Disp: , Rfl:    Cholecalciferol (D 5000) 125 MCG (5000 UT) capsule, Take 125 mcg by mouth., Disp: , Rfl:    diclofenac  (CATAFLAM ) 50 MG tablet, Take 1 tablet (50 mg total) by mouth 3 (three) times daily as needed., Disp: 60 tablet, Rfl: 0   Ferrous Sulfate  (IRON PO), Take by mouth., Disp: , Rfl:    meloxicam  (MOBIC ) 15 MG tablet, Take 1 tablet  (15 mg total) by mouth daily., Disp: 30 tablet, Rfl: 0   Multiple Vitamin (MULTIVITAMIN WITH MINERALS) TABS tablet, Take 1 tablet by mouth daily., Disp: , Rfl:    Vitamin D , Ergocalciferol , (DRISDOL ) 1.25 MG (50000 UNIT) CAPS capsule, Take 1 capsule (50,000 Units total) by mouth every 7 (seven) days. Take for 8 total doses(weeks), Disp: 8 capsule, Rfl: 0  "

## 2024-09-04 ENCOUNTER — Encounter: Payer: Self-pay | Admitting: Oncology

## 2024-09-06 ENCOUNTER — Encounter: Payer: Self-pay | Admitting: Oncology

## 2024-09-07 NOTE — ED Provider Notes (Addendum)
 [facility] Emergency Department Provider Note    ED Clinical Impression    Final diagnoses:  Cellulitis of abdominal wall (Primary)        Impression, Medical Decision Making, Progress Notes and Critical Care    Impression, Differential Diagnosis and Plan of Care  This is a afebrile, nontoxic-appearing 44 year old female with history significant for recent cholecystectomy laparoscopically on 08/29/2024 with Dr. Marchelle returning with upper right quadrant abdominal swelling and pain with lump at one of the surgical sites in her central abdomen.  On exam patient appears otherwise well.  Her generalized abdomen is nonacute.  She has no guarding.  There is no rebound tenderness.  There is mild area of of inflammation and pain to the surgical site above her umbilicus.  There is no discolorations or notable warmth.  Will do CT opacity, education further evaluation for any surgical complications.  Will also obtain BMP, CBC, and lipase.  ED Course as of 09/09/24 1237  [place] Sep 07, 2024  1643 Mr. IV ceftriaxone will discharge on course of Augmentin  twice daily for the next 10 days with close return precautions.     Portions of this record have been created using Scientist, clinical (histocompatibility and immunogenetics). Dictation errors have been sought, but may not have been identified and corrected.  See chart and resident provider documentation for details.  ____________________________________________      History     Reason for Visit Post-op Problem    HPI  Ashley Herring is a 44 y.o. female presenting for area of swelling to right upper quadrant of her abdomen with acute evaluated the pain within her abdomen and area of inflammation and lump below one of the laparoscopic surgical sites in her central abdomen.  No associated fevers.  She does have decreased stool output with notable constipation.   External Records Reviewed (Inpatient/Outpatient notes, Prior labs/imaging studies,  Care Everywhere, PDMP, External ED notes, etc)  Reviewed previous notes, as can Gen-K or accompany patient for pertinent information late visit.  Past Medical History[1]  Problem List[2]  Past Surgical History[3]   Current Facility-Administered Medications:    cefTRIAXone (ROCEPHIN) 2 g in sodium chloride  0.9 % (NS) 100 mL IVPB-MBP, 2 g, Intravenous, Once, Baker, Donnice Charleston, PA, Last Rate: 200 mL/hr at 09/07/24 1614, 2 g at 09/07/24 1614  Current Outpatient Medications:    acetaminophen  (TYLENOL ) 500 MG tablet, Take 2 tablets (1,000 mg total) by mouth., Disp: , Rfl:    amoxicillin -clavulanate (AUGMENTIN ) 875-125 mg per tablet, Take 1 tablet by mouth two (2) times a day for 10 days., Disp: 20 tablet, Rfl: 0   calcium  carb/vitamin D3/vit K1 (SOFT CHEWS CALCIUM  ORAL), Take by mouth., Disp: , Rfl:    cholecalciferol, vitamin D3-125 mcg, 5,000 unit,, 125 mcg (5,000 unit) capsule, Take 1 capsule (125 mcg total) by mouth daily., Disp: 90 capsule, Rfl: 3   meloxicam  (MOBIC ) 15 MG tablet, , Disp: , Rfl:    multivitamin-min-iron -FA-vit K (BARIATRIC MULTIVITAMINS) 45 mg iron - 800 mcg-120 mcg cap, Take by mouth., Disp: , Rfl:    oxyCODONE  (ROXICODONE ) 5 MG immediate release tablet, Take 1 tablet (5 mg total) by mouth every four (4) hours as needed for up to 2 days., Disp: 6 tablet, Rfl: 0   simethicone  (MYLICON) 125 MG chewable tablet, Chew 1 tablet (125 mg total) every six (6) hours as needed for flatulence., Disp: 30 tablet, Rfl: 1  Allergies Peanut oil  Family History[4]  Social History Short Social History[5]    Physical  Exam   ED Triage Vitals [09/07/24 1044]  Enc Vitals Group     BP 140/76     Pulse 79     SpO2 Pulse      Resp 18     Temp 36.6 C (97.9 F)     Temp Source Oral     SpO2 100 %     Weight (!) 128.3 kg (282 lb 14.4 oz)     Height      Head Circumference      Peak Flow      Pain Score      Pain Loc      Pain Education      Exclude from Growth  Chart     Constitutional: Alert and oriented. Well appearing and in no distress. Eyes: Conjunctivae are normal. Skin: Skin is warm, dry and intact. No rash noted. Psychiatric: Mood and affect are normal. Speech and behavior are normal.    Radiology   CT Abdomen Pelvis W IV Contrast  Final Result  1.  Postsurgical changes of recent cholecystectomy with expected mild stranding and small volume nonorganized fluid in the cholecystectomy bed. No organized/drainable intra-abdominal collection.  2.  Postsurgical changes of the abdominal wall with scattered areas of nonorganized subcutaneous fluid. No organized/drainable subcutaneous collection. Mild skin thickening at the midline upper abdomen could represent postsurgical change or cellulitis. Correlate with physical exam.  3.  No biliary dilatation.                 [1] Past Medical History: Diagnosis Date   ADHD    BMI 50.0-59.9, adult (CMS-HCC)    Breast lump 01/31/2019   benign breast lump, L. Mammo: 3 years ago.   CTS (carpal tunnel syndrome)    surgery upcomin 12/192024 R hand   HNP (herniated nucleus pulposus), lumbar    Hypertension    onset at age 58   IBS (irritable bowel syndrome) mixed Constipation/Diarrhea    Joint pain    Plantar fasciitis    Poor sleep hygiene/interrupted sleep    Vitamin D  deficiency   [2] Patient Active Problem List Diagnosis   Anxiety and depression   Breast lump   Chronic left-sided low back pain with left-sided sciatica   DDD (degenerative disc disease), lumbar   BMI 50.0-59.9, adult (CMS-HCC)   Hyperlipidemia   Hypertension   Plantar fasciitis, bilateral   Psychophysiologic insomnia   Right carpal tunnel syndrome   Scleritis of left eye   Spondylosis of lumbar joint   Anemia affecting pregnancy (HHS-HCC)   Elevated erythrocyte sedimentation rate   Hidradenitis suppurativa of left axilla   History of tobacco abuse   Nipple problem   Snoring    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   Nausea and vomiting   Gastrojejunal anastomotic stricture   Calculus of gallbladder without cholecystitis without obstruction  [3] Past Surgical History: Procedure Laterality Date   COLONOSCOPY  2021   HEMORROIDECTOMY     2004   PR LAP GASTRIC BYPASS/ROUX-EN-Y N/A 12/19/2023   Procedure: LAPAROSCOPY, SURG, GASTRIC RESTRICT PROC; W/GASTRIC BYPASS & ROUX-EN-Y GASTROENTEROS(ROUX LIMB 150 CM/LESS);  Surgeon: Librada Evalene Sharper, MD;  Location: OR [facility];  Service: Gastrointestinal   PR LAP,CHOLECYSTECTOMY/GRAPH N/A 08/29/2024   Procedure: LAPAROSCOPIC CHOLECYSTECTOMY WITH CHOLANGIOGRAPHY;  Surgeon: Marchelle Kitchens, MD;  Location: [facility] OR [facility];  Service: General Surgery   PR TAP BLOCK BILATERAL BY INJECTION(S) Bilateral 12/19/2023   Procedure: TRANSVERSUS ABDOMINIS PLANE (TAP) BLOCK (ABDOMINAL PLANE BLOCK, RECTUS SHEATH BLOCK) BILATERAL; BY  INJECTIONS (INCLUDES IMAGING GUIDANCE, WHEN PERFORMED);  Surgeon: Librada Evalene Sharper, MD;  Location: OR [facility];  Service: Gastrointestinal   PR UP GI ENDOSCOPY,BALL DIL,30MM N/A 03/03/2024   Procedure: UGI ENDO; W/BALLOON DILAT ESOPHAGUS (<30MM DIAM);  Surgeon: Rhoderick Burnard Norris, MD;  [facility];  Service: Gastroenterology   PR UP GI ENDOSCOPY,BALL DIL,30MM N/A 04/25/2024   Procedure: UGI ENDO; W/BALLOON DILAT ESOPHAGUS (<30MM DIAM);  Surgeon: Rhoderick Burnard Norris, MD;  Location: [facility];  Service: Gastroenterology   PR UPPER GI ENDOSCOPY,BIOPSY N/A 06/28/2023   Procedure: UGI ENDOSCOPY; WITH BIOPSY, SINGLE OR MULTIPLE;  Surgeon: Gwenyth Prentice Agent, MD;  Location: [facility];  Service: Gastroenterology   PR UPPER GI ENDOSCOPY,DIAGNOSIS N/A 12/19/2023   Procedure: UGI ENDO, INCLUDE ESOPHAGUS, STOMACH, & DUODENUM &/OR JEJUNUM; DX W/WO COLLECTION SPECIMN, BY BRUSH OR WASH;  Surgeon: Librada Evalene Sharper, MD;  Location: OR  [facility];  Service: Gastrointestinal   PR UPPER GI ENDOSCOPY,W/DILAT,GASTRIC OUT N/A 01/25/2024   Procedure: UGI ENDO; W/DILATION GASTRIC OUTLET FOR OBSTRUCTION;  Surgeon: Filbert Rodgers BROCKS, MD;  [facility];  Service: Gastroenterology   PR UPPER GI ENDOSCOPY,W/DILAT,GASTRIC OUT N/A 02/13/2024   Procedure: UGI ENDO; W/DILATION GASTRIC OUTLET FOR OBSTRUCTION;  Surgeon: Rhoderick Burnard Norris, MD;  [facility]: [facility];  Service: Gastroenterology   VAGINAL DELIVERY     x3  [4] Family History Problem Relation Age of Onset   Diabetes type II Mother    Hypertension Mother    Diabetes type I Father    Hypertension Father    No Known Problems Sister    Breast cancer Paternal Aunt    No Known Problems Maternal Grandmother    No Known Problems Maternal Grandfather    No Known Problems Paternal Grandmother    Glaucoma Paternal Grandfather    Asthma Daughter    No Known Problems Other    BRCA 1/2 Neg Hx    Cancer Neg Hx    Colon cancer Neg Hx    Endometrial cancer Neg Hx    Ovarian cancer Neg Hx   [5] Social History Tobacco Use   Smoking status: Former    Types: Cigarettes    Passive exposure: Never   Smokeless tobacco: Never   Tobacco comments:    stopped smoking 4 years ago  Vaping Use   Vaping status: Never Used  Substance Use Topics   Alcohol use: Yes    Comment: rarely   Drug use: Never   Lennie Donnice Charleston, PA 09/09/24 1237

## 2024-09-08 ENCOUNTER — Inpatient Hospital Stay: Payer: Self-pay | Attending: Oncology

## 2024-09-08 VITALS — BP 121/60 | HR 67 | Temp 96.5°F | Resp 18

## 2024-09-08 DIAGNOSIS — Z79899 Other long term (current) drug therapy: Secondary | ICD-10-CM | POA: Diagnosis not present

## 2024-09-08 DIAGNOSIS — D508 Other iron deficiency anemias: Secondary | ICD-10-CM | POA: Diagnosis present

## 2024-09-08 DIAGNOSIS — D509 Iron deficiency anemia, unspecified: Secondary | ICD-10-CM

## 2024-09-08 MED ORDER — IRON SUCROSE 20 MG/ML IV SOLN
200.0000 mg | INTRAVENOUS | Status: DC
  Administered 2024-09-08: 200 mg via INTRAVENOUS
  Filled 2024-09-08: qty 10

## 2024-09-08 MED ORDER — SODIUM CHLORIDE 0.9% FLUSH
10.0000 mL | Freq: Once | INTRAVENOUS | Status: AC | PRN
  Administered 2024-09-08: 10 mL
  Filled 2024-09-08: qty 10

## 2024-09-10 ENCOUNTER — Inpatient Hospital Stay: Payer: Self-pay

## 2024-09-10 VITALS — BP 116/64 | HR 65

## 2024-09-10 DIAGNOSIS — D509 Iron deficiency anemia, unspecified: Secondary | ICD-10-CM

## 2024-09-10 DIAGNOSIS — D508 Other iron deficiency anemias: Secondary | ICD-10-CM | POA: Diagnosis not present

## 2024-09-10 MED ORDER — IRON SUCROSE 20 MG/ML IV SOLN
200.0000 mg | INTRAVENOUS | Status: DC
  Administered 2024-09-10: 200 mg via INTRAVENOUS
  Filled 2024-09-10: qty 10

## 2024-09-10 NOTE — Patient Instructions (Signed)

## 2024-09-15 ENCOUNTER — Inpatient Hospital Stay

## 2024-09-15 VITALS — BP 136/84 | HR 73 | Temp 97.4°F | Resp 18

## 2024-09-15 DIAGNOSIS — D508 Other iron deficiency anemias: Secondary | ICD-10-CM | POA: Diagnosis not present

## 2024-09-15 DIAGNOSIS — D509 Iron deficiency anemia, unspecified: Secondary | ICD-10-CM

## 2024-09-15 MED ORDER — IRON SUCROSE 20 MG/ML IV SOLN
200.0000 mg | INTRAVENOUS | Status: DC
  Administered 2024-09-15: 200 mg via INTRAVENOUS
  Filled 2024-09-15: qty 10

## 2024-09-15 MED ORDER — SODIUM CHLORIDE 0.9% FLUSH
10.0000 mL | Freq: Once | INTRAVENOUS | Status: AC | PRN
  Administered 2024-09-15: 10 mL
  Filled 2024-09-15: qty 10

## 2024-09-17 ENCOUNTER — Inpatient Hospital Stay: Payer: Self-pay

## 2024-09-17 VITALS — BP 127/79 | HR 67 | Temp 97.3°F | Resp 18

## 2024-09-17 DIAGNOSIS — D508 Other iron deficiency anemias: Secondary | ICD-10-CM | POA: Diagnosis not present

## 2024-09-17 DIAGNOSIS — D509 Iron deficiency anemia, unspecified: Secondary | ICD-10-CM

## 2024-09-17 MED ORDER — IRON SUCROSE 20 MG/ML IV SOLN
200.0000 mg | INTRAVENOUS | Status: DC
  Administered 2024-09-17: 200 mg via INTRAVENOUS
  Filled 2024-09-17: qty 10

## 2024-09-17 NOTE — Patient Instructions (Signed)

## 2024-09-22 ENCOUNTER — Inpatient Hospital Stay

## 2024-09-22 VITALS — BP 131/79 | HR 70 | Temp 98.5°F | Resp 16

## 2024-09-22 DIAGNOSIS — D509 Iron deficiency anemia, unspecified: Secondary | ICD-10-CM

## 2024-09-22 DIAGNOSIS — D508 Other iron deficiency anemias: Secondary | ICD-10-CM | POA: Diagnosis not present

## 2024-09-22 MED ORDER — IRON SUCROSE 20 MG/ML IV SOLN
200.0000 mg | INTRAVENOUS | Status: DC
  Administered 2024-09-22: 200 mg via INTRAVENOUS
  Filled 2024-09-22: qty 10

## 2024-09-22 NOTE — Patient Instructions (Signed)

## 2024-10-07 ENCOUNTER — Other Ambulatory Visit: Payer: Self-pay | Admitting: Family Medicine

## 2024-10-07 ENCOUNTER — Other Ambulatory Visit: Payer: Self-pay

## 2024-10-07 DIAGNOSIS — Z0000 Encounter for general adult medical examination without abnormal findings: Secondary | ICD-10-CM

## 2024-10-08 LAB — COMPREHENSIVE METABOLIC PANEL WITH GFR
ALT: 20 [IU]/L (ref 0–32)
AST: 25 [IU]/L (ref 0–40)
Albumin: 4.3 g/dL (ref 3.9–4.9)
Alkaline Phosphatase: 88 [IU]/L (ref 41–116)
BUN/Creatinine Ratio: 15 (ref 9–23)
BUN: 9 mg/dL (ref 6–24)
Bilirubin Total: <0.2 mg/dL (ref 0.0–1.2)
CO2: 22 mmol/L (ref 20–29)
Calcium: 9.1 mg/dL (ref 8.7–10.2)
Chloride: 106 mmol/L (ref 96–106)
Creatinine, Ser: 0.62 mg/dL (ref 0.57–1.00)
Globulin, Total: 2.7 g/dL (ref 1.5–4.5)
Glucose: 91 mg/dL (ref 70–99)
Potassium: 4.2 mmol/L (ref 3.5–5.2)
Sodium: 142 mmol/L (ref 134–144)
Total Protein: 7 g/dL (ref 6.0–8.5)
eGFR: 114 mL/min/{1.73_m2}

## 2024-10-08 LAB — LIPID PANEL
Chol/HDL Ratio: 4.2 ratio (ref 0.0–4.4)
Cholesterol, Total: 186 mg/dL (ref 100–199)
HDL: 44 mg/dL
LDL Chol Calc (NIH): 125 mg/dL — ABNORMAL HIGH (ref 0–99)
Triglycerides: 94 mg/dL (ref 0–149)
VLDL Cholesterol Cal: 17 mg/dL (ref 5–40)

## 2024-10-08 LAB — CBC
Hematocrit: 37.2 % (ref 34.0–46.6)
Hemoglobin: 11.5 g/dL (ref 11.1–15.9)
MCH: 28.2 pg (ref 26.6–33.0)
MCHC: 30.9 g/dL — ABNORMAL LOW (ref 31.5–35.7)
MCV: 91 fL (ref 79–97)
Platelets: 308 10*3/uL (ref 150–450)
RBC: 4.08 x10E6/uL (ref 3.77–5.28)
RDW: 16.8 % — ABNORMAL HIGH (ref 11.7–15.4)
WBC: 7.8 10*3/uL (ref 3.4–10.8)

## 2024-10-08 LAB — HEMOGLOBIN A1C
Est. average glucose Bld gHb Est-mCnc: 97 mg/dL
Hgb A1c MFr Bld: 5 % (ref 4.8–5.6)

## 2024-10-10 ENCOUNTER — Ambulatory Visit: Admitting: Family Medicine

## 2024-10-10 ENCOUNTER — Encounter: Payer: Self-pay | Admitting: Family Medicine

## 2024-10-10 NOTE — Progress Notes (Incomplete)
 Ashley Herring

## 2024-10-20 ENCOUNTER — Ambulatory Visit: Admitting: Family Medicine

## 2024-10-27 ENCOUNTER — Inpatient Hospital Stay

## 2024-11-11 ENCOUNTER — Encounter: Admitting: Certified Nurse Midwife

## 2024-12-22 ENCOUNTER — Inpatient Hospital Stay: Admitting: Oncology

## 2024-12-22 ENCOUNTER — Inpatient Hospital Stay
